# Patient Record
Sex: Female | Born: 1950 | Race: Black or African American | Hispanic: No | Marital: Single | Smoking: Former smoker
Health system: Southern US, Community
[De-identification: ages and names within clinical notes are randomized; demographics above are authoritative.]

## PROBLEM LIST (undated history)

## (undated) DIAGNOSIS — C801 Malignant (primary) neoplasm, unspecified: Secondary | ICD-10-CM

## (undated) DIAGNOSIS — M545 Low back pain, unspecified: Secondary | ICD-10-CM

## (undated) DIAGNOSIS — E669 Obesity, unspecified: Secondary | ICD-10-CM

## (undated) DIAGNOSIS — G4730 Sleep apnea, unspecified: Secondary | ICD-10-CM

## (undated) DIAGNOSIS — M1990 Unspecified osteoarthritis, unspecified site: Secondary | ICD-10-CM

## (undated) DIAGNOSIS — I10 Essential (primary) hypertension: Secondary | ICD-10-CM

## (undated) DIAGNOSIS — J439 Emphysema, unspecified: Secondary | ICD-10-CM

## (undated) DIAGNOSIS — I2699 Other pulmonary embolism without acute cor pulmonale: Secondary | ICD-10-CM

## (undated) DIAGNOSIS — C50919 Malignant neoplasm of unspecified site of unspecified female breast: Secondary | ICD-10-CM

## (undated) HISTORY — DX: Sleep apnea, unspecified: G47.30

## (undated) HISTORY — DX: Unspecified osteoarthritis, unspecified site: M19.90

## (undated) HISTORY — DX: Low back pain, unspecified: M54.50

## (undated) HISTORY — PX: BIOPSY BREAST: PRO8

## (undated) HISTORY — DX: Malignant (primary) neoplasm, unspecified: C80.1

## (undated) HISTORY — DX: Essential (primary) hypertension: I10

## (undated) HISTORY — DX: Low back pain: M54.5

## (undated) HISTORY — PX: TUBAL LIGATION: SHX77

## (undated) HISTORY — DX: Obesity, unspecified: E66.9

## (undated) HISTORY — DX: Malignant neoplasm of unspecified site of unspecified female breast: C50.919

---

## 2002-09-03 ENCOUNTER — Emergency Department (HOSPITAL_COMMUNITY): Admission: EM | Admit: 2002-09-03 | Discharge: 2002-09-03 | Payer: Self-pay | Admitting: Emergency Medicine

## 2004-05-01 ENCOUNTER — Emergency Department (HOSPITAL_COMMUNITY): Admission: EM | Admit: 2004-05-01 | Discharge: 2004-05-02 | Payer: Self-pay | Admitting: Emergency Medicine

## 2007-03-24 ENCOUNTER — Ambulatory Visit (HOSPITAL_COMMUNITY): Admission: RE | Admit: 2007-03-24 | Discharge: 2007-03-24 | Payer: Self-pay | Admitting: Internal Medicine

## 2007-04-08 ENCOUNTER — Other Ambulatory Visit: Admission: RE | Admit: 2007-04-08 | Discharge: 2007-04-08 | Payer: Self-pay | Admitting: Obstetrics & Gynecology

## 2007-04-25 ENCOUNTER — Emergency Department (HOSPITAL_COMMUNITY): Admission: EM | Admit: 2007-04-25 | Discharge: 2007-04-25 | Payer: Self-pay | Admitting: Emergency Medicine

## 2008-04-18 ENCOUNTER — Ambulatory Visit (HOSPITAL_COMMUNITY): Admission: RE | Admit: 2008-04-18 | Discharge: 2008-04-18 | Payer: Self-pay | Admitting: Internal Medicine

## 2009-04-23 ENCOUNTER — Ambulatory Visit (HOSPITAL_COMMUNITY): Admission: RE | Admit: 2009-04-23 | Discharge: 2009-04-23 | Payer: Self-pay | Admitting: Internal Medicine

## 2010-03-22 ENCOUNTER — Other Ambulatory Visit: Payer: Self-pay | Admitting: Occupational Therapy

## 2010-03-22 ENCOUNTER — Other Ambulatory Visit (HOSPITAL_COMMUNITY): Payer: Self-pay | Admitting: Nurse Practitioner

## 2010-03-22 DIAGNOSIS — Z139 Encounter for screening, unspecified: Secondary | ICD-10-CM

## 2010-04-29 ENCOUNTER — Ambulatory Visit (HOSPITAL_COMMUNITY)
Admission: RE | Admit: 2010-04-29 | Discharge: 2010-04-29 | Disposition: A | Discharge status: Home / Self Care | Payer: BC Managed Care – PPO | Source: Ambulatory Visit | Attending: Nurse Practitioner | Admitting: Nurse Practitioner

## 2010-04-29 DIAGNOSIS — Z1231 Encounter for screening mammogram for malignant neoplasm of breast: Secondary | ICD-10-CM | POA: Insufficient documentation

## 2010-04-29 DIAGNOSIS — Z139 Encounter for screening, unspecified: Secondary | ICD-10-CM

## 2010-04-30 ENCOUNTER — Other Ambulatory Visit: Payer: Self-pay | Admitting: Nurse Practitioner

## 2010-04-30 DIAGNOSIS — R928 Other abnormal and inconclusive findings on diagnostic imaging of breast: Secondary | ICD-10-CM

## 2010-06-19 ENCOUNTER — Other Ambulatory Visit (HOSPITAL_COMMUNITY): Payer: Self-pay | Admitting: Nurse Practitioner

## 2010-06-19 ENCOUNTER — Other Ambulatory Visit (HOSPITAL_COMMUNITY): Payer: Self-pay | Admitting: Family Medicine

## 2010-06-19 ENCOUNTER — Ambulatory Visit (HOSPITAL_COMMUNITY)
Admission: RE | Admit: 2010-06-19 | Discharge: 2010-06-19 | Disposition: A | Discharge status: Home / Self Care | Payer: BC Managed Care – PPO | Source: Ambulatory Visit | Attending: Nurse Practitioner | Admitting: Nurse Practitioner

## 2010-06-19 DIAGNOSIS — N63 Unspecified lump in unspecified breast: Secondary | ICD-10-CM | POA: Insufficient documentation

## 2010-06-19 DIAGNOSIS — R928 Other abnormal and inconclusive findings on diagnostic imaging of breast: Secondary | ICD-10-CM

## 2010-07-03 ENCOUNTER — Other Ambulatory Visit (HOSPITAL_COMMUNITY): Payer: Self-pay | Admitting: Family Medicine

## 2010-07-03 ENCOUNTER — Ambulatory Visit (HOSPITAL_COMMUNITY)
Admission: RE | Admit: 2010-07-03 | Discharge: 2010-07-03 | Disposition: A | Discharge status: Home / Self Care | Payer: BC Managed Care – PPO | Source: Ambulatory Visit | Attending: Family Medicine | Admitting: Family Medicine

## 2010-07-03 ENCOUNTER — Other Ambulatory Visit: Payer: Self-pay | Admitting: Radiology

## 2010-07-03 ENCOUNTER — Ambulatory Visit (HOSPITAL_COMMUNITY)
Admission: RE | Admit: 2010-07-03 | Discharge: 2010-07-03 | Disposition: A | Discharge status: Home / Self Care | Payer: BC Managed Care – PPO | Source: Ambulatory Visit | Attending: Nurse Practitioner | Admitting: Nurse Practitioner

## 2010-07-03 DIAGNOSIS — C50919 Malignant neoplasm of unspecified site of unspecified female breast: Secondary | ICD-10-CM | POA: Insufficient documentation

## 2010-07-03 DIAGNOSIS — N6310 Unspecified lump in the right breast, unspecified quadrant: Secondary | ICD-10-CM

## 2010-07-03 DIAGNOSIS — N63 Unspecified lump in unspecified breast: Secondary | ICD-10-CM | POA: Insufficient documentation

## 2010-07-08 ENCOUNTER — Other Ambulatory Visit: Payer: Self-pay | Admitting: Family Medicine

## 2010-07-08 DIAGNOSIS — C50911 Malignant neoplasm of unspecified site of right female breast: Secondary | ICD-10-CM

## 2010-07-15 ENCOUNTER — Ambulatory Visit
Admission: RE | Admit: 2010-07-15 | Discharge: 2010-07-15 | Disposition: A | Discharge status: Home / Self Care | Payer: BC Managed Care – PPO | Source: Ambulatory Visit | Attending: Family Medicine | Admitting: Family Medicine

## 2010-07-15 DIAGNOSIS — C50911 Malignant neoplasm of unspecified site of right female breast: Secondary | ICD-10-CM

## 2010-07-15 MED ORDER — GADOBENATE DIMEGLUMINE 529 MG/ML IV SOLN
20.0000 mL | Freq: Once | INTRAVENOUS | Status: AC | PRN
  Administered 2010-07-15: 20 mL via INTRAVENOUS

## 2010-07-18 ENCOUNTER — Other Ambulatory Visit (INDEPENDENT_AMBULATORY_CARE_PROVIDER_SITE_OTHER): Payer: Self-pay | Admitting: General Surgery

## 2010-07-18 ENCOUNTER — Ambulatory Visit (INDEPENDENT_AMBULATORY_CARE_PROVIDER_SITE_OTHER): Payer: BC Managed Care – PPO | Admitting: General Surgery

## 2010-07-18 ENCOUNTER — Encounter (INDEPENDENT_AMBULATORY_CARE_PROVIDER_SITE_OTHER): Payer: Self-pay | Admitting: General Surgery

## 2010-07-18 VITALS — BP 140/82 | HR 88 | Temp 97.2°F | Ht 62.0 in | Wt 242.4 lb

## 2010-07-18 DIAGNOSIS — C50911 Malignant neoplasm of unspecified site of right female breast: Secondary | ICD-10-CM

## 2010-07-18 DIAGNOSIS — C50319 Malignant neoplasm of lower-inner quadrant of unspecified female breast: Secondary | ICD-10-CM

## 2010-07-18 NOTE — Progress Notes (Signed)
Subjective:     Patient ID: Alexa Castillo, female   DOB: 29-Aug-1950, 60 y.o.   MRN: [id number]    BP 140/82  Pulse 88  Temp(Src) 97.2 F (36.2 C) (Temporal)  Ht 5\' 2"  (1.575 m)  Wt 242 lb 6.4 oz (109.952 kg)  BMI 44.34 kg/m2    HPI This patient is a pleasant 60 year old female referred through the breast center for a new diagnosis of cancer of the right breast. She recently presented for her routine screening mammogram. She had not noted any breast symptoms specifically no lump pain or discharge or skin changes. Screening mammogram revealed a probable abnormality in the lower inner right breast. Diagnostic mammogram and Spot compression views revealed a small oval nodule in this area. Subsequent ultrasound was performed showing a 6 mm hypoechoic nodule. Large core needle biopsy was recommended and was performed. I reviewed his pathology which has shown a grade 2 invasive ductal carcinoma. Prognostic panel is pending. MRI has been performed confirming a sub-1 cm nodule in this area with no other abnormalities. Past Medical History  Diagnosis Date  . Cancer     breast  . Diabetes mellitus   . Hypertension   . Obesity    Past Surgical History  Procedure Date  . Tubal ligation    Current Outpatient Prescriptions  Medication Sig Dispense Refill  . glipiZIDE (GLUCOTROL) 10 MG tablet       . lisinopril (PRINIVIL,ZESTRIL) 20 MG tablet       . metFORMIN (GLUCOPHAGE) 500 MG tablet       . pravastatin (PRAVACHOL) 20 MG tablet        Review of Systems  Constitutional: Negative for fever, chills and fatigue.  HENT: Negative for hearing loss and ear pain.   Eyes: Negative for visual disturbance.  Respiratory: Negative for cough, shortness of breath and wheezing.   Cardiovascular: Negative for chest pain, palpitations and leg swelling.  Gastrointestinal: Negative for vomiting, abdominal pain and constipation.       Objective:   Physical Exam Gen.: Obese African American female in no  acute distress  HEENT: No masses or thyromegaly. Sclera nonicteric.  Lymph nodes: No cervical supraclavicular or axillary nodes palpable.  Breasts: There is no deformity or skin change. I cannot feel any masses in either breast with particular attention to the lower inner quadrant of the right breast.  Cardiovascular: Regular rate and rhythm without murmur. Trace ankle edema. Peripheral pulses intact. No JVD.  Abdomen: Obese soft and nontender. No masses or organomegaly noted.  Extremities: Trace ankle edema. No joint swelling or deformity.  Neurologic: Alert and fully oriented. Gait normal.    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 Clinically T1 N0. We discussed initial surgical treatment options i.e. lumpectomy versus mastectomy. I believe she would be a very good candidate for lumpectomy and this is what she would prefer. We discussed in detail the nature of lumpectomy and axillary sentinel lymph node biopsy. She will need needle localization. We discussed risks of bleeding infection anesthetic complications and possible need for further surgery based on final pathology findings. Schedule this in the near future at her convenience.    Plan:    Right breast needle localized lumpectomy and right axillary sentinel lymph node biopsy as discussed above. Of note is the patient would like to have any followup treatment done in retail if possible.

## 2010-07-18 NOTE — Patient Instructions (Signed)
Will schedule surgery

## 2010-07-23 ENCOUNTER — Encounter (HOSPITAL_BASED_OUTPATIENT_CLINIC_OR_DEPARTMENT_OTHER)
Admission: RE | Admit: 2010-07-23 | Discharge: 2010-07-23 | Disposition: A | Discharge status: Home / Self Care | Payer: BC Managed Care – PPO | Source: Ambulatory Visit | Attending: General Surgery | Admitting: General Surgery

## 2010-07-23 LAB — POCT I-STAT, CHEM 8
Chloride: 103 mEq/L (ref 96–112)
Creatinine, Ser: 0.7 mg/dL (ref 0.50–1.10)
Glucose, Bld: 113 mg/dL — ABNORMAL HIGH (ref 70–99)
HCT: 42 % (ref 36.0–46.0)
TCO2: 26 mmol/L (ref 0–100)

## 2010-07-24 ENCOUNTER — Ambulatory Visit (HOSPITAL_BASED_OUTPATIENT_CLINIC_OR_DEPARTMENT_OTHER)
Admission: RE | Admit: 2010-07-24 | Discharge: 2010-07-24 | Disposition: A | Discharge status: Home / Self Care | Payer: BC Managed Care – PPO | Source: Ambulatory Visit | Attending: General Surgery | Admitting: General Surgery

## 2010-07-24 ENCOUNTER — Ambulatory Visit
Admission: RE | Admit: 2010-07-24 | Discharge: 2010-07-24 | Disposition: A | Discharge status: Home / Self Care | Payer: BC Managed Care – PPO | Source: Ambulatory Visit | Attending: General Surgery | Admitting: General Surgery

## 2010-07-24 ENCOUNTER — Other Ambulatory Visit (INDEPENDENT_AMBULATORY_CARE_PROVIDER_SITE_OTHER): Payer: Self-pay | Admitting: General Surgery

## 2010-07-24 ENCOUNTER — Ambulatory Visit (HOSPITAL_COMMUNITY)
Admission: RE | Admit: 2010-07-24 | Discharge: 2010-07-24 | Disposition: A | Discharge status: Home / Self Care | Payer: BC Managed Care – PPO | Source: Ambulatory Visit | Attending: General Surgery | Admitting: General Surgery

## 2010-07-24 DIAGNOSIS — C50911 Malignant neoplasm of unspecified site of right female breast: Secondary | ICD-10-CM

## 2010-07-24 DIAGNOSIS — Z01810 Encounter for preprocedural cardiovascular examination: Secondary | ICD-10-CM | POA: Insufficient documentation

## 2010-07-24 DIAGNOSIS — E669 Obesity, unspecified: Secondary | ICD-10-CM | POA: Insufficient documentation

## 2010-07-24 DIAGNOSIS — I10 Essential (primary) hypertension: Secondary | ICD-10-CM | POA: Insufficient documentation

## 2010-07-24 DIAGNOSIS — E119 Type 2 diabetes mellitus without complications: Secondary | ICD-10-CM | POA: Insufficient documentation

## 2010-07-24 DIAGNOSIS — C50919 Malignant neoplasm of unspecified site of unspecified female breast: Secondary | ICD-10-CM

## 2010-07-24 DIAGNOSIS — Z01812 Encounter for preprocedural laboratory examination: Secondary | ICD-10-CM | POA: Insufficient documentation

## 2010-07-24 DIAGNOSIS — C50319 Malignant neoplasm of lower-inner quadrant of unspecified female breast: Secondary | ICD-10-CM | POA: Insufficient documentation

## 2010-07-24 LAB — GLUCOSE, CAPILLARY: Glucose-Capillary: 169 mg/dL — ABNORMAL HIGH (ref 70–99)

## 2010-07-24 MED ORDER — TECHNETIUM TC 99M SULFUR COLLOID FILTERED
1.0000 | Freq: Once | INTRAVENOUS | Status: AC | PRN
  Administered 2010-07-24: 1 via INTRADERMAL

## 2010-07-25 LAB — GLUCOSE, CAPILLARY: Glucose-Capillary: 137 mg/dL — ABNORMAL HIGH (ref 70–99)

## 2010-07-26 ENCOUNTER — Telehealth (INDEPENDENT_AMBULATORY_CARE_PROVIDER_SITE_OTHER): Payer: Self-pay | Admitting: General Surgery

## 2010-07-26 NOTE — Telephone Encounter (Signed)
LM path OK

## 2010-07-29 NOTE — Op Note (Addendum)
NAMEAUBREY, Alexa Castillo              ACCOUNT NO.:  [id number]  MEDICAL RECORD NO.:  [id number]  LOCATION:  [facility]C                          FACILITY:  [facility]  PHYSICIAN:  Sharlet Salina T. Kimari Coudriet, M.D.DATE OF BIRTH:  [birth year]  DATE OF PROCEDURE:  07/24/2010 DATE OF DISCHARGE:                              OPERATIVE REPORT   PREOPERATIVE DIAGNOSIS:  Cancer of the right breast.  POSTOPERATIVE DIAGNOSIS:  Cancer of the right breast.  SURGICAL PROCEDURES: 1. Blue dye injection, right breast. 2. Needle-localized right breast lumpectomy. 3. Right axillary sentinel lymph node biopsy.  SURGEON:  Lorne Skeens. Nicklous Aburto, MD  ANESTHESIA:  General.  BRIEF HISTORY:  Ms. Gordy is a 61 year old female who on recent mammogram was found to have a small sub 1-cm mass in the lower inner right breast.  Clinical stage T1 N0 M0.  Following discussion of treatment options, we have elected to breast conservation with the needle-localized lumpectomy and sentinel lymph node biopsy.  The patient underwent needle localization at the Breast Center preoperatively.  She had injection of 1 mCi of technetium sulfur colloid in the holding area. She is now brought to the operating room for these procedures.  DESCRIPTION OF OPERATION:  The patient was brought to the operating room, placed supine position on the operating table.  A laryngeal mask general anesthesia was induced.  The right breast was sterilely prepped and correct patient and procedure verified.  With sterile technique, 10 mL of dilute methylene blue was injected subcutaneously beneath the right nipple and massaged for several minutes.  The entire breast axilla, upper arm were then widely and sterilely prepped and draped. The lumpectomy was approached initially.  A transverse incision was made medially over the described location of the mass and dissection carried down through the subcutaneous tissue at the breast capsule.  The wire was brought into the  incision.  I then excised a generous core of tissue around the shaft and tip of the wire.  The specimen was oriented with ink and specimen mammogram obtained.  This showed the intact wire clip and lesion within the specimen.  The closest margin appeared to be medial and slightly inferior and I reexcised this area of the lumpectomy cavity back another 1/2 cm so and the specimen was oriented and sent separately for permanent pathology.  A complete hemostasis was obtained with cautery.  The soft tissue was infiltrated with Marcaine. Initially, the breast tissue and subcutaneous tissue were closed with interrupted 3-0 Vicryl.  Attention was then turned to the sentinel lymph node.  A hot area in the right axilla was identified with the Neoprobe and a small transverse incision made.  Dissection was carried down through subcutaneous tissue using cautery and the clavipectoral fascia was carefully entered with blunt dissection.  Using the Neoprobe for guidance, I bluntly dissected down onto a blue, very hot otherwise normal-appearing lymph node.  It was dissected away from the axillary contents with cautery and removed intact.  Ex vivo the node had counts of about 400 and background after removal in the axilla was essentially 0.  This was sent for permanent pathology as a hot blue axillary sentinel lymph node.  Hemostasis was obtained in this  wound with cautery.  The soft tissue here was infiltrated with Marcaine with epinephrine.  The deep subcu was closed with interrupted Vicryl.  Both incisions were closed with subcuticular Monocryl and Dermabond.  Sponge, needle, and instrument counts were correct.  The patient was taken to the recovery room in good condition.     Lorne Skeens. Onna Nodal, M.D.     Tory Emerald  D:  07/24/2010  T:  07/24/2010  Job:  [id number]  Electronically Signed by Glenna Fellows M.D. on 08/19/2010 04:40:28 PM

## 2010-07-31 ENCOUNTER — Telehealth (INDEPENDENT_AMBULATORY_CARE_PROVIDER_SITE_OTHER): Payer: Self-pay | Admitting: General Surgery

## 2010-08-12 ENCOUNTER — Other Ambulatory Visit (INDEPENDENT_AMBULATORY_CARE_PROVIDER_SITE_OTHER): Payer: Self-pay | Admitting: General Surgery

## 2010-08-12 ENCOUNTER — Other Ambulatory Visit (INDEPENDENT_AMBULATORY_CARE_PROVIDER_SITE_OTHER): Payer: Self-pay

## 2010-08-12 DIAGNOSIS — C50919 Malignant neoplasm of unspecified site of unspecified female breast: Secondary | ICD-10-CM

## 2010-08-23 ENCOUNTER — Encounter (INDEPENDENT_AMBULATORY_CARE_PROVIDER_SITE_OTHER): Payer: Self-pay | Admitting: General Surgery

## 2010-08-23 ENCOUNTER — Ambulatory Visit (INDEPENDENT_AMBULATORY_CARE_PROVIDER_SITE_OTHER): Payer: BC Managed Care – PPO | Admitting: General Surgery

## 2010-08-23 VITALS — Temp 98.0°F | Wt 245.8 lb

## 2010-08-23 DIAGNOSIS — Z9889 Other specified postprocedural states: Secondary | ICD-10-CM

## 2010-08-23 NOTE — Patient Instructions (Signed)
Call as needed for questions or concerns 

## 2010-08-23 NOTE — Progress Notes (Signed)
Patient returns for followup in 3 weeks following right breast lumpectomy and sentinel lymph node biopsy. She reports she is doing well with no problems.  Exam: Both wounds are well healed without complication.  We reviewed her pathology which showed a negative sentinel lymph node. Invasive ductal carcinoma was present grade 2 spanning 0.7 cm. 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  Assessment and plan: She is doing well postoperatively. She has appointments at the cancer center. I will see her back in 6 months for long-term cancer followup.

## 2010-09-06 ENCOUNTER — Encounter (HOSPITAL_COMMUNITY): Payer: Self-pay | Admitting: Oncology

## 2010-09-06 ENCOUNTER — Encounter (HOSPITAL_COMMUNITY): Payer: BC Managed Care – PPO | Attending: Oncology | Admitting: Oncology

## 2010-09-06 VITALS — BP 144/88 | HR 106 | Temp 98.9°F | Wt 245.0 lb

## 2010-09-06 DIAGNOSIS — Z170 Estrogen receptor positive status [ER+]: Secondary | ICD-10-CM

## 2010-09-06 DIAGNOSIS — C50319 Malignant neoplasm of lower-inner quadrant of unspecified female breast: Secondary | ICD-10-CM

## 2010-09-06 NOTE — Patient Instructions (Signed)
[facility]  Discharge Instructions  RECOMMENDATIONS MADE BY THE CONSULTANT AND ANY TEST RESULTS WILL BE SENT TO YOUR REFERRING DOCTOR.   EXAM FINDINGS BY MD TODAY AND SIGNS AND SYMPTOMS TO REPORT TO CLINIC OR PRIMARY MD: Will need to see a Radiation Oncologist as soon as possible.  Hormonal therapy would not add a great deal of benefit and due to potential side effects we would not use.  MEDICATIONS PRESCRIBED: none   INSTRUCTIONS GIVEN AND DISCUSSED:  Report any new lumps, bone pain or shortness of breath.   SPECIAL INSTRUCTIONS/FOLLOW-UP: Lab work Needed in 3 months and Other:  Follow -up with MD in 3 months.   I acknowledge that I have been informed and understand all the instructions given to me and received a copy. I do not have any more questions at this time, but understand that I may call the Specialty Clinic at [facility] at [phone] during business hours should I have any further questions or need assistance in obtaining follow-up care.    __________________________________________  _____________  __________ Signature of Patient or Authorized Representative            Date                   Time    __________________________________________ Nurse's Signature

## 2010-09-06 NOTE — Progress Notes (Signed)
This office note has been dictated.

## 2010-10-25 NOTE — Progress Notes (Signed)
CC:   Lorne Skeens. Hoxworth, M.D. [facility]  DIAGNOSIS: 1. Stage IB, grade 2 invasive ductal carcinoma status post lumpectomy     and sentinel node biopsy by Dr. Jaclynn Guarneri on 07/24/2010.  Lymph     node was negative.  The tumor was 7 mm in size, it was grade 2.  It     was extremely ER positive at 100%, PR positive 100%, Ki-67,     however, was 50%, HER2 was not amplified. 2. Obesity. 3. History of hypertension. 4. History of diabetes mellitus. 5. History of poor dental hygiene. 6. History of hypercholesterolemia on Pravachol.  HISTORY:  This is a pleasant lady, 60 years old, who on routine mammography was found have a small but definite lesion confirmed by MRI to be around 8 mm across.  At the time of pathology, it was 7 mm across. The predefinitive surgical biopsy was positive for tumor.  It was felt to be grade 2 and was confirmed on the final histology.  She was not able to breast feed her children.  She is postmenopausal.  She has had a previous breast biopsy on the right before, years ago in [place], that was benign.  She is not using postmenopausal estrogens.  She works at [employer] in [place] and has done so for 15 years.  Prior to that, she worked at [employer] about 8 years.  She raised her 2 sons who are in good health.  She presently lives with her younger son and helps raise her 2 granddaughters.  His significant other died at age 45 of unclear causes.  She has been helping raise them since they were 4 and 5, respectively.  FAMILY HISTORY:  Her sister had breast cancer, and her mother died of some type of liver cancer.  Her postoperative course has been uneventful, and she is recovering well.  She has no history of blood clots, no history of stroke, heart attack, etc.  Presently, she is not having any headaches, nausea, vomiting, etc.  The rest of the oncologic review of systems is negative.  PHYSICAL EXAMINATION:  Vital Signs:  She  has a weight of 245 pounds. Her height is not recorded.  She has a blood pressure of 144/88.  She is afebrile, pulse right around 100 and regular, respirations 16-18 and unlabored.  Skin:  Warm and dry to the touch.  General:  She is in no acute distress.  HEENT:  She does have poor dental hygiene.  Pupils show early cataract changes bilaterally, but pupils respond to light equally well.  EOMs are intact.  Facial symmetry is intact.  Neck:  She has no thyromegaly.  Nodes:  No adenopathy in any location.  Lungs:  Clear to auscultation and percussion.  She has no CVA or bony tenderness. Breasts:  The left breast is negative.  The right breast has incisions which are extremely well healed.  They do not appear to be tender. There are no masses.  Heart:  Shows a regular rhythm and rate, at this time rate right around 80 supine.  She has no S3 gallop or murmur. Abdomen:  Obese, nontender without obvious masses or organomegaly. Extremities:  She has no peripheral edema of the arms or legs.  She does complain of intermittent edema when I mentioned her legs to her, intermittently only on the right.  It is usually when she is on her feet.  She is right-handed.  She has a stage IB tumor which will  most likely not take her life over the next 10 years.  That is the good news.  The bad news is that the Ki- 67 marker is slightly high, and so I think she is a candidate for possibly consideration of adjuvant hormone therapy, if nothing else but to help protect the left breast.  In the meantime, she has not seen a radiation therapist, and we will set that up as soon as possible.  Since she kept her breast, she will need radiation.  She should do well no matter what we do, but we will try to find a hormone agent that agrees with her and does not give her too many side effects.  It may reduce her risk from somewhere around 2% to 8% to about 1% to 6% over the next 5-10 years, and that is about all we can,  I think, hope for.  I do not think it will help mortality significantly at this time; that is in reducing mortality.  We will see her back after radiation.  We will go over the drugs at that time.  We will try to probably take an aromatase inhibitor such as Arimidex and then go forward.  I did not need any blood work on her today.  We will just check that in 3 months.    ______________________________ Ladona Horns. Mariel Sleet, MD ESN/MEDQ  D:  09/06/2010  T:  09/06/2010  Job:  [id number]

## 2010-12-04 ENCOUNTER — Encounter (HOSPITAL_COMMUNITY): Payer: BC Managed Care – PPO | Attending: Oncology

## 2010-12-04 DIAGNOSIS — C50919 Malignant neoplasm of unspecified site of unspecified female breast: Secondary | ICD-10-CM | POA: Insufficient documentation

## 2010-12-04 DIAGNOSIS — C50319 Malignant neoplasm of lower-inner quadrant of unspecified female breast: Secondary | ICD-10-CM

## 2010-12-04 DIAGNOSIS — I10 Essential (primary) hypertension: Secondary | ICD-10-CM | POA: Insufficient documentation

## 2010-12-04 DIAGNOSIS — E119 Type 2 diabetes mellitus without complications: Secondary | ICD-10-CM | POA: Insufficient documentation

## 2010-12-04 LAB — COMPREHENSIVE METABOLIC PANEL
ALT: 22 U/L (ref 0–35)
AST: 17 U/L (ref 0–37)
Alkaline Phosphatase: 93 U/L (ref 39–117)
CO2: 28 mEq/L (ref 19–32)
Calcium: 10 mg/dL (ref 8.4–10.5)
GFR calc Af Amer: 89 mL/min — ABNORMAL LOW (ref >90)
GFR calc non Af Amer: 77 mL/min — ABNORMAL LOW (ref >90)
Glucose, Bld: 174 mg/dL — ABNORMAL HIGH (ref 70–99)
Potassium: 4.1 mEq/L (ref 3.5–5.1)
Sodium: 140 mEq/L (ref 135–145)
Total Protein: 7.6 g/dL (ref 6.0–8.3)

## 2010-12-04 LAB — CBC
Hemoglobin: 13.2 g/dL (ref 12.0–15.0)
Platelets: 264 10*3/uL (ref 150–400)
RBC: 4.48 MIL/uL (ref 3.87–5.11)

## 2010-12-04 NOTE — Progress Notes (Signed)
Labs drawn today for cbc,cmp 

## 2010-12-10 ENCOUNTER — Encounter (HOSPITAL_COMMUNITY): Payer: Self-pay | Admitting: Oncology

## 2010-12-10 ENCOUNTER — Encounter (HOSPITAL_BASED_OUTPATIENT_CLINIC_OR_DEPARTMENT_OTHER): Payer: BC Managed Care – PPO | Admitting: Oncology

## 2010-12-10 VITALS — BP 129/83 | HR 108 | Temp 98.5°F | Ht 61.75 in | Wt 245.4 lb

## 2010-12-10 DIAGNOSIS — C50319 Malignant neoplasm of lower-inner quadrant of unspecified female breast: Secondary | ICD-10-CM

## 2010-12-10 DIAGNOSIS — Z170 Estrogen receptor positive status [ER+]: Secondary | ICD-10-CM

## 2010-12-10 MED ORDER — ANASTROZOLE 1 MG PO TABS: 1.0000 mg | ORAL_TABLET | Freq: Every day | ORAL | Status: AC

## 2010-12-10 NOTE — Progress Notes (Signed)
This office note has been dictated.

## 2010-12-10 NOTE — Patient Instructions (Signed)
[facility]  Discharge Instructions Alexa Castillo  [id number] [birth year]  RECOMMENDATIONS MADE BY THE CONSULTANT AND ANY TEST RESULTS WILL BE SENT TO YOUR REFERRING DOCTOR.   EXAM FINDINGS BY MD TODAY AND SIGNS AND SYMPTOMS TO REPORT TO CLINIC OR PRIMARY MD: We need to start antihormone pill to cut out estrogen supply to cancer.  MEDICATIONS PRESCRIBED: Arimidex  --take one daily. Start today or tomorrow. This was sent to [facility]. Follow label directions  INSTRUCTIONS GIVEN AND DISCUSSED: Other : Side-effects of med include aching, joint pain. If you experience changes in the aching you have now, let us know and we can try another pill.  Also you may have hot flashes. Notify us of any uterine bleeding and continue your pap smears. Notify us of swelling, pain in back of lower leg or shortness of breath and chest pain. There is a 1% chance of blood clots.   SPECIAL INSTRUCTIONS/FOLLOW-UP: Return to Clinic in 2 months   I acknowledge that I have been informed and understand all the instructions given to me and received a copy. I do not have any more questions at this time, but understand that I may call the Specialty Clinic at [facility] at [phone] during business hours should I have any further questions or need assistance in obtaining follow-up care.    __________________________________________  _____________  __________ Signature of Patient or Authorized Representative            Date                   Time    __________________________________________ Nurse's Signature

## 2010-12-10 NOTE — Progress Notes (Signed)
CC:   [facility] Dr. Rene Paci, MD Lorne Skeens. Hoxworth, M.D.  DIAGNOSES: 1. Stage IB (T1b N0) invasive ductal carcinoma of the right breast.     Estrogen receptor positive.  Progesterone receptor positive.  Ki-67     was 50%.  HER-2 was negative. 2. Obesity. 3. Diabetes mellitus. 4. History of hypertension. 5. History of right knee pain. This lady had a small tumor, 7 mm in size.  It is ER-positive 100%, PR- positive at 100%, HER-2 negative.  Ki-67 marker, interestingly, was 50%, but with this size tumor it was impossible to justify the chemotherapy. She had a sentinel lymph node that was negative as well.  So she has completed radiation therapy after lumpectomy and sentinel node biopsy, and that just finished.  She finished that on 11/07/2010.  She is ready to consider antiestrogen therapy, and we will start with an aromatase inhibitor, namely Arimidex 1 mg a day, and I went over in detail with her the side effects.  We spent 25 minutes to 30 minutes discussing the details of the drug, the side effects potentially and the potential for benefit.  I will see her in 2 months to make sure she is tolerating the drug all right, and she knows to call sooner if need be.  I did not do any blood work on her today.  If she is fine in 2 months, we will see her 6 months later with blood work probably at that time.  She is ready to proceed.  She will start the therapy today and the drug was E-prescribed to her pharmacy.    ______________________________ Ladona Horns. Mariel Sleet, MD ESN/MEDQ  D:  12/10/2010  T:  12/10/2010  Job:  [id number]

## 2011-02-06 ENCOUNTER — Encounter (INDEPENDENT_AMBULATORY_CARE_PROVIDER_SITE_OTHER): Payer: Self-pay | Admitting: General Surgery

## 2011-02-07 ENCOUNTER — Encounter (HOSPITAL_COMMUNITY): Payer: BC Managed Care – PPO | Attending: Oncology | Admitting: Oncology

## 2011-02-07 VITALS — BP 144/86 | HR 105 | Temp 98.4°F | Wt 241.4 lb

## 2011-02-07 DIAGNOSIS — E669 Obesity, unspecified: Secondary | ICD-10-CM

## 2011-02-07 DIAGNOSIS — C50319 Malignant neoplasm of lower-inner quadrant of unspecified female breast: Secondary | ICD-10-CM

## 2011-02-07 DIAGNOSIS — M25559 Pain in unspecified hip: Secondary | ICD-10-CM

## 2011-02-07 NOTE — Patient Instructions (Signed)
[facility]  Discharge Instructions  RECOMMENDATIONS MADE BY THE CONSULTANT AND ANY TEST RESULTS WILL BE SENT TO YOUR REFERRING DOCTOR.   EXAM FINDINGS BY MD TODAY AND SIGNS AND SYMPTOMS TO REPORT TO CLINIC OR PRIMARY MD: Exam good today. Continue the Arimidex as ordered. Have the xray of your hip done as soon as possible. Return to clinic in 6 months to see the doctor. Report any problems or concerns to the clinic as needed.    I acknowledge that I have been informed and understand all the instructions given to me and received a copy. I do not have any more questions at this time, but understand that I may call the Specialty Clinic at [facility] at [phone] during business hours should I have any further questions or need assistance in obtaining follow-up care.    __________________________________________  _____________  __________ Signature of Patient or Authorized Representative            Date                   Time    __________________________________________ Nurse's Signature

## 2011-02-07 NOTE — Progress Notes (Signed)
This office note has been dictated.

## 2011-02-07 NOTE — Progress Notes (Signed)
CC:   Alexa Castillo. Hoxworth, M.D. [name], FNP [name], M.D.  DIAGNOSES: 1. Stage IB, grade 2 invasive ductal carcinoma of the right breast.     Status post lumpectomy and sentinel node biopsy by Dr. Johna Sheriff on     07/24/2010.  Lymph node was negative.  The tumor 7 mm in size,     grade 2, very ER positive 100%, PR positive 100%, Ki-67 marker     however was 50%.  HER-2/neu was negative for amplification.  We     elected to give her postoperative radiation therapy and Arimidex,     more for the opposite breast than for this cancer which has a good     prognosis. 2. Obesity. 3. Hypertension. 4. Diabetes mellitus. 5. Poor dental hygiene. 6. Hypercholesterolemia on Pravachol.  Alexa Castillo is here today, tolerating the Arimidex very well except for a few hot flashes which are getting better she states.  She however has had right hip pain for about 2 weeks, more when she walks.  It does not really get that much better after she gets up and moves around.  It does not bother her at night when she is sleeping or laying in bed.  It does not bother her that much when she is sitting down.  She is still working at [employer].  She has had pain in her hip she states intermittently for several years, but this is the worst it has been.  She is not aware of any else new or different on oncologic review of systems.  Her vital signs today still show that she is very overweight at 241 pounds, but she is trying to eliminate the junk food she states. She is down about 3-1/2 to 4 pounds.  Her blood pressure is 144/86 in the sitting position.  Her respirations are 18 and unlabored.  Her temperature is normal.  She is complaining of some discomfort without question in the right hip.  It is still a score of 6 she states.  She has no obvious adenopathy.  No obvious breast masses in either breast. The lungs are clear to auscultation and percussion.  Heart shows a regular rhythm and rate  without obvious murmur, rub, or gallop.  Abdomen is obese without obvious masses or organomegaly.  She has no leg edema. No arm edema.  She has to work next week and would like to have an x-ray of her hip only the week of February 11, so will set it up for that week.  If she has anything significant, we will get an orthopedic consultation.  She has not seen an orthopedist in her lifetime.    ______________________________ Ladona Horns. Mariel Sleet, MD ESN/MEDQ  D:  02/07/2011  T:  02/07/2011  Job:  [id number]

## 2011-02-18 ENCOUNTER — Ambulatory Visit (HOSPITAL_COMMUNITY)
Admission: RE | Admit: 2011-02-18 | Discharge: 2011-02-18 | Disposition: A | Discharge status: Home / Self Care | Payer: BC Managed Care – PPO | Source: Ambulatory Visit | Attending: Oncology | Admitting: Oncology

## 2011-02-18 DIAGNOSIS — M47817 Spondylosis without myelopathy or radiculopathy, lumbosacral region: Secondary | ICD-10-CM | POA: Insufficient documentation

## 2011-02-18 DIAGNOSIS — M25559 Pain in unspecified hip: Secondary | ICD-10-CM | POA: Insufficient documentation

## 2011-02-18 DIAGNOSIS — M1610 Unilateral primary osteoarthritis, unspecified hip: Secondary | ICD-10-CM | POA: Insufficient documentation

## 2011-02-18 DIAGNOSIS — C50319 Malignant neoplasm of lower-inner quadrant of unspecified female breast: Secondary | ICD-10-CM

## 2011-02-26 ENCOUNTER — Ambulatory Visit: Payer: BC Managed Care – PPO | Admitting: Orthopedic Surgery

## 2011-03-05 ENCOUNTER — Ambulatory Visit (INDEPENDENT_AMBULATORY_CARE_PROVIDER_SITE_OTHER): Payer: BC Managed Care – PPO | Admitting: Orthopedic Surgery

## 2011-03-05 ENCOUNTER — Encounter: Payer: Self-pay | Admitting: Orthopedic Surgery

## 2011-03-05 DIAGNOSIS — M5136 Other intervertebral disc degeneration, lumbar region: Secondary | ICD-10-CM

## 2011-03-05 DIAGNOSIS — M169 Osteoarthritis of hip, unspecified: Secondary | ICD-10-CM

## 2011-03-05 DIAGNOSIS — M5137 Other intervertebral disc degeneration, lumbosacral region: Secondary | ICD-10-CM

## 2011-03-05 MED ORDER — DICLOFENAC POTASSIUM 50 MG PO TABS: 50.0000 mg | ORAL_TABLET | Freq: Two times a day (BID) | ORAL | Status: AC

## 2011-03-05 NOTE — Patient Instructions (Addendum)
Call PT department closest to you to schedule therapy   Degenerative Disc Disease Degenerative disc disease is a condition caused by the changes that occur in the cushions of the backbone (spinal discs) as you grow older. Spinal discs are soft and compressible discs located between the bones of the spine (vertebrae). They act like shock absorbers. Degenerative disc disease can affect the wholespine. However, the neck and lower back are most commonly affected. Many changes can occur in the spinal discs with aging, such as:  The spinal discs may dry and shrink.     Small tears may occur in the tough, outer covering of the disc (annulus).     The disc space may become smaller due to loss of water.     Abnormal growths in the bone (spurs) may occur. This can put pressure on the nerve roots exiting the spinal canal, causing pain.     The spinal canal may become narrowed.  CAUSES   Degenerative disc disease is a condition caused by the changes that occur in the spinal discs with aging. The exact cause is not known, but there is a genetic basis for many patients. Degenerative changes can occur due to loss of fluid in the disc. This makes the disc thinner and reduces the space between the backbones. Small cracks can develop in the outer layer of the disc. This can lead to the breakdown of the disc. You are more likely to get degenerative disc disease if you are overweight. Smoking cigarettes and doing heavy work such as weightlifting can also increase your risk of this condition. Degenerative changes can start after a sudden injury. Growth of bone spurs can compress the nerve roots and cause pain.   SYMPTOMS   The symptoms vary from person to person. Some people may have no pain, while others have severe pain. The pain may be so severe that it can limit your activities. The location of the pain depends on the part of your backbone that is affected. You will have neck or arm pain if a disc in the neck area  is affected. You will have pain in your back, buttocks, or legs if a disc in the lower back is affected. The pain becomes worse while bending, reaching up, or with twisting movements. The pain may start gradually and then get worse as time passes. It may also start after a major or minor injury. You may feel numbness or tingling in the arms or legs.   DIAGNOSIS   Your caregiver will ask you about your symptoms and about activities or habits that may cause the pain. He or she may also ask about any injuries, diseases, ortreatments you have had earlier. Your caregiver will examine you to check for the range of movement that is possible in the affected area, to check for strength in your extremities, and to check for sensation in the areas of the arms and legs supplied by different nerve roots. An X-ray of the spine may be taken. Your caregiver may suggest other imaging tests, such as a computerized magnetic scan (MRI), if needed.   TREATMENT   Treatment includes rest, modifying your activities, and applying ice and heat. Your caregiver may prescribe medicines to reduce your pain and may ask you to do some exercises to strengthen your back. In some cases, you may need surgery. You and your caregiver will decide on the treatment that is best for you. HOME CARE INSTRUCTIONS    Follow proper lifting and walking  techniques as advised by your caregiver.     Maintain good posture.     Exercise regularly as advised.     Perform relaxation exercises.     Change your sitting, standing, and sleeping habits as advised. Change positions frequently.     Lose weight as advised.     Stop smoking if you smoke.     Wear supportive footwear.  SEEK MEDICAL CARE IF:   The pain does not go away within 1 to 4 weeks. SEEK IMMEDIATE MEDICAL CARE IF:    The pain is severe.     You notice weakness in your arms, hands, or legs.     You begin to lose control of your bladder or bowel.  MAKE SURE YOU:    Understand  these instructions.     Will watch your condition.     Will get help right away if you are not doing well or get worse.  Document Released: 10/27/2006 Document Revised: 09/11/2010 Document Reviewed: 10/27/2006 [facility] Patient Information 2012 [place], [place].Total Hip Replacement   In total hip replacement, the damaged hip is replaced with an artificial hip joint (prosthesis). The purpose of this surgery is to reduce pain and improve your range of motion. It is one of the most successful joint replacement surgeries. LET YOUR CAREGIVER KNOW ABOUT:    Allergies.     Medicines taken, including herbs, eyedrops, over-the-counter medicines, and creams.     Use of steroids (by mouth or creams).     Previous problems with anesthetics.     Family history of anesthetic problems.     Possibility of pregnancy, if this applies.     History of blood clots (thrombophlebitis).     History of bleeding or blood problems.     Previous surgery.     Other health problems.  BEFORE THE PROCEDURE    Do not eat or drink anything for as long as directed by your caregiver before surgery.     You should be present 60 minutes before your procedure or as directed by your caregiver.  PROCEDURE An intravenous (IV) line for giving fluids will be started. You will be given medicines and gas to make you sleep (anesthetic), or you will be given medicines through a needle in your back to make you numb from the waist down. Your surgeon will take out any damaged cartilage and bone. He or she will then put in new metal, plastic, or ceramic joint surfaces to restore alignment and function to your hip. AFTER THE PROCEDURE   After the procedure, you will be taken to the recovery area where a nurse will watch and check your progress. You may have a long, narrow tube (catheter) in your bladder after surgery. The catheter helps you empty your bladder (pass your urine). Once you are awake, stable, and taking fluids well,  you will be returned to your room. You will receive physical therapy until you are doing well and your caregiver feels it is safe for you to go home. If you do not have help at home, you may need to go to an extended care facility for 5 to 14 days after the procedure. Document Released: 04/07/2000 Document Revised: 09/11/2010 Document Reviewed: 11/01/2008 [facility] Patient Information 2012 [place], [place].

## 2011-03-08 ENCOUNTER — Encounter: Payer: Self-pay | Admitting: Orthopedic Surgery

## 2011-03-08 DIAGNOSIS — M5136 Other intervertebral disc degeneration, lumbar region: Secondary | ICD-10-CM | POA: Insufficient documentation

## 2011-03-08 DIAGNOSIS — M169 Osteoarthritis of hip, unspecified: Secondary | ICD-10-CM | POA: Insufficient documentation

## 2011-03-08 NOTE — Progress Notes (Signed)
  Subjective:    Alexa Castillo is a 61 y.o. female who presents with pain in the RIGHT hip for over a year but worse in the last 3 months.  The pain onset was gradual.  The pain is sharp constant and moderate-to-severe rated 7/10.  The pain occurs in the morning and throughout the day occasionally relieved by Advil.  The pain is worse when sitting or standing for long periods of time.  There is some catching in the RIGHT hip but the patient reports she can do most of her activities of daily living without difficulty.  The following portions of the patient's history were reviewed and updated as appropriate: allergies, current medications, past family history, past medical history, past social history, past surgical history and problem list.   Review of Systems A comprehensive review of systems was negative except for: positive findings of respiratory system snoring, genitourinary system frequency.  Blurred vision.   Objective:    BP 110/60  Ht 5' 1.75" (1.568 m)  Wt 241 lb (109.317 kg)  BMI 44.44 kg/m2  Vital signs are stable as recorded  General appearance is normal  The patient is alert and oriented x3  The patient's mood and affect are normal  Gait assessment: normal The cardiovascular exam reveals normal pulses and temperature without edema swelling.  The lymphatic system is negative for palpable lymph nodes  The sensory exam is normal.  There are no pathologic reflexes.  Balance is normal.  Upper extremity exam  Inspection and palpation revealed no abnormalities in the upper extremities.  Range of motion is full without contracture.  Motor exam is normal with grade 5 strength.  The joints are fully reduced without subluxation.  There is no atrophy or tremor and muscle tone is normal.  All joints are stable.  Exam of the lumbar spine Inspection lumbar spine tenderness gluteal tenderness RIGHT side Range of motion decreased flexion-extension lumbar spine Stability  normal Strength normal Skin normal  Right hip: inspection reveals no tenderness around the hip area greater trochanter.  Her hip flexion is normal she has some mild pain with internal rotation of the hip I cannot reproduce the pain that she is having with motion of the hip.  Muscle tone is normal in the hip is stable.  Left hip: full painless range of motion, without tenderness and negatives: no pain with heel impact normal strength and stability   Imaging: X-ray pelvis was done at the hospital as well as hip x-rays and it does show that she has bilateral degenerative joint disease of the hip.  There is also some spondylosis noted in the lumbar area.: further x-rays will be needed.    Assessment:    problem #1 spondylosis lumbar spine   Problem #2 osteoarthritis bilateral hips   Plan:    I think at this time most of the pain is coming from her lumbar spine despite her hip arthritis so we are going to start some physical therapy at first.

## 2011-03-11 ENCOUNTER — Ambulatory Visit (HOSPITAL_COMMUNITY)
Admission: RE | Admit: 2011-03-11 | Discharge: 2011-03-11 | Disposition: A | Discharge status: Home / Self Care | Payer: BC Managed Care – PPO | Source: Ambulatory Visit | Attending: Orthopedic Surgery | Admitting: Orthopedic Surgery

## 2011-03-11 DIAGNOSIS — M545 Low back pain, unspecified: Secondary | ICD-10-CM | POA: Insufficient documentation

## 2011-03-11 DIAGNOSIS — R262 Difficulty in walking, not elsewhere classified: Secondary | ICD-10-CM | POA: Insufficient documentation

## 2011-03-11 DIAGNOSIS — E119 Type 2 diabetes mellitus without complications: Secondary | ICD-10-CM | POA: Insufficient documentation

## 2011-03-11 DIAGNOSIS — M25559 Pain in unspecified hip: Secondary | ICD-10-CM | POA: Insufficient documentation

## 2011-03-11 DIAGNOSIS — IMO0001 Reserved for inherently not codable concepts without codable children: Secondary | ICD-10-CM | POA: Insufficient documentation

## 2011-03-11 DIAGNOSIS — R29898 Other symptoms and signs involving the musculoskeletal system: Secondary | ICD-10-CM | POA: Insufficient documentation

## 2011-03-11 NOTE — Evaluation (Signed)
Physical Therapy Evaluation  Patient Details  Name: Alexa Castillo MRN: [id number] Date of Birth: 22-Jul-1950  Today's Date: 03/11/2011 Time: 4098-1191 Time Calculation (min): 50 min  Visit#: 1  of 12   Re-eval: 04/10/11 Assessment Diagnosis: DDD Next MD Visit: if needed Prior Therapy: none  Past Medical History:  Past Medical History  Diagnosis Date  . Cancer     breast  . Diabetes mellitus   . Hypertension   . Obesity   . Breast cancer   . Low back pain    Past Surgical History:  Past Surgical History  Procedure Date  . Tubal ligation   . Biopsy breast     Subjective Symptoms/Limitations Symptoms: Alexa Castillo states that her back pain has been constant for the past three years and is progressive in nature.  She states for the past three months she has been having pain into her right hip as well.   She was placed on a new medication which has improved her pain by 30%.  She is being referred to PT to improve her symptoms of pain and return her to prior functinal level. How long can you sit comfortably?: Sitting makes her back feels better but if she sits for longer than an hour it is difficult for her to get up. How long can you stand comfortably?: The patient has increased pain after standing for 10-15 minutes How long can you walk comfortably?: Walking increases her pain after less than five minutes. 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 Pain Assessment Currently in Pain?:  (prior to meds 5 or greater.) Pain Score:   3 Pain Location: Back Pain Orientation: Left Pain Radiating Towards: Right hip.  Pt Left side of her back hurts but has pain in her right hip. Pain Relieving Factors: sit down;  Effect of Pain on Daily Activities: increases  Multiple Pain Sites: Yes    Cognition/Observation Cognition Overall Cognitive Status: Appears within functional limits for tasks assessed   Assessment RLE  Strength Right Hip Flexion: 5/5 Right Hip Extension: 3/5 Right Hip ABduction: 5/5 Right Hip ADduction: 4/5 Right Knee Flexion: 5/5 Right Knee Extension: 5/5 Right Ankle Dorsiflexion: 5/5 LLE Strength Left Hip Flexion: 5/5 Left Hip Extension: 3/5 Left Hip ABduction: 4/5 Left Hip ADduction: 4/5 Left Knee Flexion: 5/5 Left Knee Extension: 5/5 Left Ankle Dorsiflexion: 5/5 Lumbar AROM Lumbar Flexion: wfl Lumbar Extension: wfl with reps increasing pain Lumbar - Right Side Bend: wfl Lumbar - Left Side Bend: wfl Lumbar - Right Rotation: decreased 25% Lumbar - Left Rotation: decreased 25%  Exercise/Treatments Mobility/Balance  Posture/Postural Control Posture/Postural Control: Postural limitations Postural Limitations: Pt has decreased kyphosis increased lordosis   Stretches Passive Hamstring Stretch:  (pelvic tilt x 5) Single Knee to Chest Stretch: 5 reps;20 seconds (towel assist.) Lower Trunk Rotation: 5 reps Lumbar Exercises   Stability Ab Set: 10 reps Machine Exercises       Physical Therapy Assessment and Plan PT Assessment and Plan Clinical Impression Statement: Pt with decreased core mm with flexion biased who will benefit from skilled PT to decrease sx of pain and improve funcitonal activity. Rehab Potential: Good Clinical Impairments Affecting Rehab Potential: weakness; pain; difficulty walking PT Frequency: Min 3X/week PT Duration: 4 weeks PT Treatment/Interventions: Therapeutic activities;Other (comment) (modalities as needed for pain) PT Plan: Next treatment begin bridge; clam; SLR, heel raise and functional squat; walking with abs tight; third begin side-lying AB and prone heel squeeze  Goals Home Exercise Program Pt will Perform Home Exercise Program: Independently PT Short Term Goals Time to Complete Short Term Goals: 2 weeks PT Short Term Goal 1: Pain to be no greater than a 3 PT Short Term Goal 2: Pt to be able to stand for 15 minutes without  increase pain PT Short Term Goal 3: Pt to be able to walk for ten minutes without increase pain PT Short Term Goal 4: Pt mm strength to be wnl PT Long Term Goals Time to Complete Long Term Goals: 4 weeks PT Long Term Goal 1: No hip pain back pain to be no greater than a 1 PT Long Term Goal 2: I in advance HEP Long Term Goal 3: Pt to be able to stand for 30 without increased pain Long Term Goal 4: Pt to be able to walk for 30 minutes without pain PT Long Term Goal 5: Pt to be able to show proper body mechanics when lifting.  Problem List Patient Active Problem List  Diagnoses  . Right breast cancer [id number] ER Positive  . Osteoarthritis of hip  . DDD (degenerative disc disease), lumbar  . Weakness of right leg  . Left leg weakness    PT - End of Session Activity Tolerance: Patient tolerated treatment well General Behavior During Session: [facility] for tasks performed Cognition: [facility] for tasks performed PT Plan of Care PT Home Exercise Plan: given Consulted and Agree with Plan of Care: Patient    Alexa Castillo 03/11/2011, 3:18 PM  Physician Documentation Your signature is required to indicate approval of the treatment plan as stated above.  Please sign and either send electronically or make a copy of this report for your files and return this physician signed original.   Please mark one 1.__approve of plan  2. ___approve of plan with the following conditions.   ______________________________                                                          _____________________ Physician Signature                                                                                                             Date

## 2011-03-26 ENCOUNTER — Other Ambulatory Visit (HOSPITAL_COMMUNITY): Payer: Self-pay | Admitting: Oncology

## 2011-03-26 DIAGNOSIS — C50919 Malignant neoplasm of unspecified site of unspecified female breast: Secondary | ICD-10-CM

## 2011-04-04 ENCOUNTER — Ambulatory Visit (INDEPENDENT_AMBULATORY_CARE_PROVIDER_SITE_OTHER): Payer: BC Managed Care – PPO | Admitting: General Surgery

## 2011-04-04 ENCOUNTER — Encounter (INDEPENDENT_AMBULATORY_CARE_PROVIDER_SITE_OTHER): Payer: Self-pay | Admitting: General Surgery

## 2011-04-04 VITALS — BP 134/76 | HR 68 | Temp 97.9°F | Resp 18 | Ht 62.0 in | Wt 242.4 lb

## 2011-04-04 DIAGNOSIS — C50319 Malignant neoplasm of lower-inner quadrant of unspecified female breast: Secondary | ICD-10-CM

## 2011-04-04 NOTE — Progress Notes (Signed)
Chief complaint: Followup breast cancer  History: Patient returns for routine followup now 9 months following right breast lumpectomy and negative axillary sentinel lymph node biopsy, radiation therapy and now on adjuvant Arimidex for T1 N07 millimeter invasive cancer of the right breast. She has been having some pain which has been a recurring problem for her and is improving after some therapy. She denies any problems related to her breast. Specifically no lump or skin change or nipple discharge. She does notice a little tenderness occasionally in the upper breast.  Exam: BP 134/76  Pulse 68  Temp(Src) 97.9 F (36.6 C) (Temporal)  Resp 18  Ht 5\' 2"  (1.575 m)  Wt 242 lb 6.4 oz (109.952 kg)  BMI 44.34 kg/m2  Gen.: Obese African female in no distress Skin: No rash or infection Lungs: Clear without wheezing or increased work of breathing Lymph nodes: No cervical, supraclavicular, or axillary lymph nodes palpable Breasts: Mild post radiation changes right breast. There are no palpable masses with particular attention to lumpectomy site. The skin lesions. Left breast is also negative.  Imaging: Due in the next couple of months  Assessment and plan: Doing well with no evidence of recurrent or new primary breast cancer. I asked her to return in 6 months.

## 2011-04-30 ENCOUNTER — Ambulatory Visit (HOSPITAL_COMMUNITY): Payer: BC Managed Care – PPO

## 2011-05-14 ENCOUNTER — Ambulatory Visit (HOSPITAL_COMMUNITY)
Admission: RE | Admit: 2011-05-14 | Discharge: 2011-05-14 | Disposition: A | Discharge status: Home / Self Care | Payer: BC Managed Care – PPO | Source: Ambulatory Visit | Attending: Oncology | Admitting: Oncology

## 2011-05-14 DIAGNOSIS — Z09 Encounter for follow-up examination after completed treatment for conditions other than malignant neoplasm: Secondary | ICD-10-CM | POA: Insufficient documentation

## 2011-05-14 DIAGNOSIS — C50919 Malignant neoplasm of unspecified site of unspecified female breast: Secondary | ICD-10-CM

## 2011-05-14 DIAGNOSIS — Z853 Personal history of malignant neoplasm of breast: Secondary | ICD-10-CM | POA: Insufficient documentation

## 2011-07-22 ENCOUNTER — Other Ambulatory Visit (HOSPITAL_COMMUNITY): Payer: Self-pay | Admitting: Oncology

## 2011-07-22 ENCOUNTER — Telehealth (HOSPITAL_COMMUNITY): Payer: Self-pay | Admitting: Oncology

## 2011-07-22 ENCOUNTER — Encounter (HOSPITAL_COMMUNITY): Payer: Self-pay | Admitting: Oncology

## 2011-07-22 DIAGNOSIS — C50319 Malignant neoplasm of lower-inner quadrant of unspecified female breast: Secondary | ICD-10-CM

## 2011-07-22 MED ORDER — ANASTROZOLE 1 MG PO TABS: 1.0000 mg | ORAL_TABLET | Freq: Every day | ORAL | Status: DC

## 2011-08-07 ENCOUNTER — Ambulatory Visit (HOSPITAL_COMMUNITY)
Admission: RE | Admit: 2011-08-07 | Discharge: 2011-08-07 | Disposition: A | Discharge status: Home / Self Care | Payer: BC Managed Care – PPO | Source: Ambulatory Visit | Attending: Oncology | Admitting: Oncology

## 2011-08-07 ENCOUNTER — Encounter (HOSPITAL_COMMUNITY): Payer: Self-pay | Admitting: Oncology

## 2011-08-07 ENCOUNTER — Encounter (HOSPITAL_COMMUNITY): Payer: BC Managed Care – PPO | Attending: Oncology | Admitting: Oncology

## 2011-08-07 VITALS — BP 129/82 | HR 104 | Temp 98.6°F | Wt 240.8 lb

## 2011-08-07 DIAGNOSIS — I10 Essential (primary) hypertension: Secondary | ICD-10-CM | POA: Insufficient documentation

## 2011-08-07 DIAGNOSIS — M545 Low back pain, unspecified: Secondary | ICD-10-CM | POA: Insufficient documentation

## 2011-08-07 DIAGNOSIS — E7800 Pure hypercholesterolemia, unspecified: Secondary | ICD-10-CM | POA: Insufficient documentation

## 2011-08-07 DIAGNOSIS — R9431 Abnormal electrocardiogram [ECG] [EKG]: Secondary | ICD-10-CM | POA: Insufficient documentation

## 2011-08-07 DIAGNOSIS — C50919 Malignant neoplasm of unspecified site of unspecified female breast: Secondary | ICD-10-CM | POA: Insufficient documentation

## 2011-08-07 DIAGNOSIS — C50319 Malignant neoplasm of lower-inner quadrant of unspecified female breast: Secondary | ICD-10-CM

## 2011-08-07 DIAGNOSIS — R079 Chest pain, unspecified: Secondary | ICD-10-CM

## 2011-08-07 DIAGNOSIS — M51379 Other intervertebral disc degeneration, lumbosacral region without mention of lumbar back pain or lower extremity pain: Secondary | ICD-10-CM | POA: Insufficient documentation

## 2011-08-07 DIAGNOSIS — E669 Obesity, unspecified: Secondary | ICD-10-CM | POA: Insufficient documentation

## 2011-08-07 DIAGNOSIS — E119 Type 2 diabetes mellitus without complications: Secondary | ICD-10-CM

## 2011-08-07 DIAGNOSIS — Z853 Personal history of malignant neoplasm of breast: Secondary | ICD-10-CM | POA: Insufficient documentation

## 2011-08-07 DIAGNOSIS — M5137 Other intervertebral disc degeneration, lumbosacral region: Secondary | ICD-10-CM | POA: Insufficient documentation

## 2011-08-07 DIAGNOSIS — Z79899 Other long term (current) drug therapy: Secondary | ICD-10-CM | POA: Insufficient documentation

## 2011-08-07 LAB — DIFFERENTIAL
Basophils Absolute: 0 10*3/uL (ref 0.0–0.1)
Basophils Relative: 0 % (ref 0–1)
Eosinophils Absolute: 0.3 10*3/uL (ref 0.0–0.7)
Monocytes Absolute: 0.6 10*3/uL (ref 0.1–1.0)
Monocytes Relative: 6 % (ref 3–12)
Neutro Abs: 7.1 10*3/uL (ref 1.7–7.7)

## 2011-08-07 LAB — COMPREHENSIVE METABOLIC PANEL
AST: 19 U/L (ref 0–37)
Albumin: 3.6 g/dL (ref 3.5–5.2)
BUN: 14 mg/dL (ref 6–23)
Calcium: 10.1 mg/dL (ref 8.4–10.5)
Creatinine, Ser: 0.76 mg/dL (ref 0.50–1.10)
Total Protein: 7.8 g/dL (ref 6.0–8.3)

## 2011-08-07 LAB — CBC
HCT: 39.1 % (ref 36.0–46.0)
Hemoglobin: 12.8 g/dL (ref 12.0–15.0)
MCH: 29.2 pg (ref 26.0–34.0)
MCHC: 32.7 g/dL (ref 30.0–36.0)
RDW: 14 % (ref 11.5–15.5)

## 2011-08-07 NOTE — Patient Instructions (Signed)
[facility]  Discharge Instructions Alexa Castillo  [id number] 1950-11-01 Dr. Glenford Peers  RECOMMENDATIONS MADE BY THE CONSULTANT AND ANY TEST RESULTS WILL BE SENT TO YOUR REFERRING DOCTOR.   EXAM FINDINGS BY MD TODAY AND SIGNS AND SYMPTOMS TO REPORT TO CLINIC OR PRIMARY MD:   We are going to get an x-ray of your back today to investigate the pain.  We will do an EKG today-your chest pain is of concern and we would also like for you to see your primary care Dr. Regarding this.  Lab work today.  INSTRUCTIONS GIVEN AND DISCUSSED:  Call us with any new lumps in your breast, nipple discharge or new bone pains  SPECIAL INSTRUCTIONS/FOLLOW-UP:  6 months to see Dr.  I acknowledge that I have been informed and understand all the instructions given to me and received a copy. I do not have any more questions at this time, but understand that I may call the [facility] at [facility] at [phone] during business hours should I have any further questions or need assistance in obtaining follow-up care.    __________________________________________  _____________  __________ Signature of Patient or Authorized Representative            Date                   Time    __________________________________________ Nurse's Signature

## 2011-08-07 NOTE — Progress Notes (Signed)
Alexa Linden, NP [phone] [place] [address]  1. Right breast cancer T1bN0M0 ER Positive  CBC, Differential, Comprehensive metabolic panel    CURRENT THERAPY: Started Arimidex 12/10/2010  INTERVAL HISTORY: Alexa Castillo 61 y.o. female returns for  regular  visit for followup of Stage IB, grade 2 invasive ductal carcinoma of the right breast. Status post lumpectomy and sentinel node biopsy by Dr. Johna Sheriff on 07/24/2010. Lymph node was negative. The tumor 7 mm in size, grade 2, very ER positive 100%, PR positive 100%, Ki-67 marker however was 50%. HER-2/neu was negative for amplification. We elected to give her postoperative radiation therapy and Arimidex, more for the opposite breast than for this cancer which has a good prognosis.  Started Arimidex 12/10/2010   Alexa Castillo is here for follow-up.  She is tolerating her Arimidex well.  She denies any arthralgias/myalgias, and explains that the hot flashes are improving.  She reports some low back pain with standing.  She is a Conservation officer, nature at [employer].  She explains that her back pain is not present at rest, but after 4 hours of working at [employer] and standing, she begin to have some back pain.  She describes it as aching.  We will perform a lumbar X-ray to rule out any metastatic disease.   She also notes some intermittent chest pain.  She reports that it happens occasionally without any identifiable pattern.  It occurs with exertion and rest.  She Reports that with rest it subsides.  She describes it as sternal aching pain.  She denies any SOB or diaphoresis.  She does have HTN and her obesity puts her at risk for cardiovascular disease in addition to her DM and  hypercholesterolemia.  Will perform EKG today and I have requested she follow-up with PCP regarding the chest pain.  This was dictated in her discharge instructions as well.  She denies any heart palpitations.   Otherwise, she is doing well.  She denies any breast  changes and nipple discharge.  She does perform self-breast exams and denies any changes.  I provided self-breast exam education.    Past Medical History  Diagnosis Date  . Cancer     breast  . Diabetes mellitus   . Hypertension   . Obesity   . Breast cancer   . Low back pain   . Arthritis     back/saw Dr. Romeo Apple    has Right breast cancer T1bN0M0 ER Positive; Osteoarthritis of hip; DDD (degenerative disc disease), lumbar; Weakness of right leg; and Left leg weakness on her problem list.      has no known allergies.  [name] does not currently have medications on file.  Past Surgical History  Procedure Date  . Tubal ligation   . Biopsy breast     Denies any headaches, dizziness, double vision, fevers, chills, night sweats, nausea, vomiting, diarrhea, constipation, heart palpitations, shortness of breath, blood in stool, black tarry stool, urinary pain, urinary burning, urinary frequency, hematuria.   PHYSICAL EXAMINATION  ECOG PERFORMANCE STATUS: 1 - Symptomatic but completely ambulatory  Filed Vitals:   08/07/11 1118  BP: 129/82  Pulse: 104  Temp: 98.6 F (37 C)    GENERAL:alert, no distress, well nourished, well developed, comfortable, cooperative, obese and smiling SKIN: skin color, texture, turgor are normal, no rashes or significant lesions HEAD: Normocephalic, No masses, lesions, tenderness or abnormalities EYES: normal, Conjunctiva are pink and non-injected EARS: External ears normal OROPHARYNX:lips, buccal mucosa, and tongue normal, mucous  membranes are moist and poor dentition  NECK: supple, no adenopathy, thyroid normal size, non-tender, without nodularity, no stridor, non-tender, trachea midline LYMPH:  no palpable lymphadenopathy BREAST:right breast normal without mass, skin or nipple changes or axillary nodes with some radiation changes appreciated with skin thickening and hyperpigmentation (right breast is smaller than left), left breast normal  without mass, skin or nipple changes or axillary nodes with some fibroglandular tissue appreciated in the lower quadrants of the left breast. LUNGS: clear to auscultation and percussion HEART: regular rate & rhythm, no murmurs, no gallops, S1 normal and S2 normal ABDOMEN:abdomen soft, non-tender, obese, normal bowel sounds, no masses or organomegaly, difficult to assess for hepatosplenomegaly due to body habitus, but none noted.  BACK: Back symmetric, no curvature., No CVA tenderness, tenderness noted to percussion of lumbar spine EXTREMITIES:less then 2 second capillary refill, no joint deformities, effusion, or inflammation, no skin discoloration, no clubbing, no cyanosis, positive findings:  edema 1+ pedal pitting edema.   NEURO: alert & oriented x 3 with fluent speech, no focal motor/sensory deficits, gait normal     PENDING LABS: CBC diff, CMET   RADIOGRAPHIC STUDIES:  05/14/2011  *RADIOLOGY REPORT*  Clinical Data: History of right breast cancer status post  lumpectomy 2012  DIGITAL DIAGNOSTIC BILATERAL MAMMOGRAM WITH CAD  Comparison: With priors  Findings: There are scattered fibroglandular densities.  Lumpectomy changes are seen in the right breast. There is no new  suspicious mass or malignant-type microcalcifications in either  breast.  Mammographic images were processed with CAD.  IMPRESSION:  No evidence of malignancy in either breast. Diagnostic mammogram in  1 year is recommended.  BI-RADS CATEGORY 2: Benign finding(s).  Original Report Authenticated By: Littie Deeds. Judyann Munson, M.D.    PATHOLOGY: 07/24/2010  Diagnosis 1. Lymph node, sentinel, biopsy, right axillary - THERE IS NO EVIDENCE OF CARCINOMA IN 1 OF 1 LYMPH NODE (0/1). 2. Breast, lumpectomy, right - INVASIVE DUCTAL CARCINOMA, GRADE II/III, SPANNING 0.7 CM. - DUCTAL CARCINOMA IN SITU, INTERMEDIATE GRADE. - DUCTAL CARCINOMA IN SITU IS FOCALLY 0.1 CM TO THE POSTERIOR MARGIN OF SPECIMEN #1 AND FOCALLY 0.2 CM TO THE  SUPERIOR MARGIN OF SPECIMEN #1. - LOBULAR NEOPLASIA (ATYPICAL LOBULAR HYPERPLASIA). - SEE ONCOLOGY TABLE BELOW. 3. Breast, excision, additional posterior medial margins - FIBROCYSTIC CHANGES. - THERE IS NO EVIDENCE OF MALIGNANCY. - SEE COMMENT. Microscopic Comment 2. BREAST, WITH LYMPH NODE SAMPLING Specimen, including laterality: Right breast Procedure: Needle localized lumpectomy Tumor size of largest invasive carcinoma (gross measurement): 0.7 cm Margins: Invasive component, distance to closest margin: Greater than 0.2 cm to all margins (glass slide measurement). In situ component, distance to closest margin: Focally 0.1 cm to the posterior margin of specimen #1 and focally 0.2 cm to the superior margin of specimen #1 Lymph - Vascular invasion: Not identified. Histologic type, invasive component: Ductal Grade, invasive component (Nottingham combined histologic score): II Tubule formation grade: 3 Nuclear pleomorphism grade: 2 Mitotic grade: 2 Ductal carcinoma in situ: Present Nuclear grade: Intermediate 1 of 3 FINAL for SELINE, ENZOR ([id number]) Microscopic Comment(continued) Necrosis: Not identified Extensive intraductal component: Yes Lobular neoplasia present: Yes, atypical lobular hyperplasia, focal Treatment effect (if treated with neoadjuvant therapy): N/A Multicentric (separate tumors in different quadrants): Unknown Multifocal (separate tumors in same quadrant or biopsy): No Macroscopic or microscopic extent of tumor: Confined to breast parenchyma. Axillary lymph nodes: Number examined: 1 Number with metastasis: 0 TNM Code: pT1b, pN0 Breast Prognostic Markers: [id number] Estrogen receptor: 100%, strong staining intensity. Progesterone receptor: 100%,  strong staining intensity. Ki 67 (Mib-1): 50% Her 2 neu by C[facility]: No amplification was identified. Her2 will be performed on the current case and the results reported separately. 3. The surgical resection  margins of the specimen were inked and microscopically evaluated. (JBK) Pecola Leisure MD Pathologist, Electronic Signature (Case signed 07/25/2010)    ASSESSMENT:  1. Stage IB, grade 2 invasive ductal carcinoma of the right breast. Status post lumpectomy and sentinel node biopsy by Dr. Johna Sheriff on 07/24/2010. Lymph node was negative. The tumor 7 mm in size, grade 2, very ER positive 100%, PR positive 100%, Ki-67 marker however was 50%. HER-2/neu was negative for amplification. We elected to give her postoperative radiation therapy and Arimidex, more for the opposite breast than for this cancer which has a good prognosis. Started Arimidex 12/10/2010 2. Obesity.  3. Hypertension.  4. Diabetes mellitus.  5. Poor dental hygiene.  6. Hypercholesterolemia on Pravachol.   PLAN:  1. I personally reviewed and went over laboratory results with the patient. 2. I personally reviewed and went over radiographic studies with the patient. 3. Lab work today: CBC diff, CMET 4. EKG today. 5. Lumbar spine X-ray to evaluate for metastasis.  6. Continue with Arimidex daily.  7. Follow-up with PCP regarding intermittent chest pain.  8. Continue follow-up with Dr. Romeo Apple for right hip pain.  9. Return in 6 months for follow-up.   All questions were answered. The patient knows to call the clinic with any problems, questions or concerns. We can certainly see the patient much sooner if necessary.  KEFALAS,THOMAS    Addendum:  EKG is unimpressive.  NSR with 91 BPM.  Decent r-wave progression through leads 1-6.     *RADIOLOGY REPORT*  Clinical Data: 61 year old female with history breast cancer. Low  back pain.  LUMBAR SPINE - COMPLETE 4+ VIEW  Comparison: None.  Findings: Lumbar segmentation. Dextroconvex lumbar scoliosis.  Diffusely advanced facet degeneration from L3-L4 to L5-S1. Trace  anterolisthesis throughout these levels. No pars fracture. Disc  spaces are relatively preserved except L5-S1. No  compression  fracture.  Bone mineralization accounting for degenerative changes and A2  appears within normal limits throughout the lumbar spine.  Visualized lower thoracic and pelvic bone mineralization is grossly  normal.  IMPRESSION:  1. No definite acute or metastatic osseous abnormality the lumbar  spine. If suspicion of metastatic disease persists, nuclear  medicine whole body bone scan or MRI would be far more sensitive.  2. Chronic advanced lumbar facet degeneration from L3-L4 to L5-S1.  Chronic L5-S1 disc degeneration.  Original Report Authenticated By: Harley Hallmark, M.D.      If low back pain continues or worsens, we can pursue a bone scan to verify that there is no bone lesions.   KEFALAS,THOMAS

## 2011-10-03 ENCOUNTER — Encounter (INDEPENDENT_AMBULATORY_CARE_PROVIDER_SITE_OTHER): Payer: Self-pay | Admitting: General Surgery

## 2011-10-03 ENCOUNTER — Ambulatory Visit (INDEPENDENT_AMBULATORY_CARE_PROVIDER_SITE_OTHER): Payer: BC Managed Care – PPO | Admitting: General Surgery

## 2011-10-03 VITALS — BP 134/80 | HR 68 | Temp 97.4°F | Resp 16 | Ht 62.0 in | Wt 237.8 lb

## 2011-10-03 DIAGNOSIS — C50319 Malignant neoplasm of lower-inner quadrant of unspecified female breast: Secondary | ICD-10-CM

## 2011-10-03 NOTE — Progress Notes (Signed)
Chief complaint: Followup cancer of the breast  History:Patient returns for routine followup now over one year following right breast lumpectomy and negative axillary sentinel lymph node biopsy, radiation therapy and now on adjuvant Arimidex for T1 N0 7 millimeter invasive cancer of the right breast. She reports she is doing well. She gets an occasional brief mild pain in her treated breast denies any significant persistent discomfort, lump, skin change or nipple discharge. She has had some significant back pain but has had x-rays and evaluation done with findings of arthritis. She remains on her arimadex with the only noticeable side effect being hot flashes.  Exam: BP 134/80  Pulse 68  Temp 97.4 F (36.3 C) (Temporal)  Resp 16  Ht 5\' 2"  (1.575 m)  Wt 237 lb 12.8 oz (107.865 kg)  BMI 43.49 kg/m2 General: Obese but otherwise well-appearing African American female Skin: No rash or infection Lymph nodes: No cervical, supraclavicular, or axillary nodes palpable Breasts: Mild postradiation changes in the right breast. No palpable masses with particular attention to the lumpectomy site. No other abnormalities either breast.  Imaging: Mammogram negative in May of this year  Assessment and plan: Doing well with no evidence of early recurrence following treatment of stage I cancer of the right breast. Return in 6 months.

## 2012-01-19 ENCOUNTER — Other Ambulatory Visit (HOSPITAL_COMMUNITY): Payer: Self-pay | Admitting: Oncology

## 2012-01-19 ENCOUNTER — Telehealth (HOSPITAL_COMMUNITY): Payer: Self-pay | Admitting: Oncology

## 2012-01-19 DIAGNOSIS — C50319 Malignant neoplasm of lower-inner quadrant of unspecified female breast: Secondary | ICD-10-CM

## 2012-01-19 MED ORDER — ANASTROZOLE 1 MG PO TABS: 1.0000 mg | ORAL_TABLET | Freq: Every day | ORAL | Status: DC

## 2012-02-11 ENCOUNTER — Ambulatory Visit (HOSPITAL_COMMUNITY): Payer: BC Managed Care – PPO | Admitting: Oncology

## 2012-02-16 ENCOUNTER — Encounter (HOSPITAL_COMMUNITY): Payer: BC Managed Care – PPO | Attending: Oncology | Admitting: Oncology

## 2012-02-16 VITALS — BP 139/80 | HR 96 | Temp 98.0°F | Resp 18 | Wt 240.0 lb

## 2012-02-16 DIAGNOSIS — Z853 Personal history of malignant neoplasm of breast: Secondary | ICD-10-CM | POA: Insufficient documentation

## 2012-02-16 DIAGNOSIS — E669 Obesity, unspecified: Secondary | ICD-10-CM | POA: Insufficient documentation

## 2012-02-16 DIAGNOSIS — Z170 Estrogen receptor positive status [ER+]: Secondary | ICD-10-CM

## 2012-02-16 DIAGNOSIS — I10 Essential (primary) hypertension: Secondary | ICD-10-CM | POA: Insufficient documentation

## 2012-02-16 DIAGNOSIS — M5136 Other intervertebral disc degeneration, lumbar region: Secondary | ICD-10-CM

## 2012-02-16 DIAGNOSIS — E119 Type 2 diabetes mellitus without complications: Secondary | ICD-10-CM

## 2012-02-16 DIAGNOSIS — E7800 Pure hypercholesterolemia, unspecified: Secondary | ICD-10-CM | POA: Insufficient documentation

## 2012-02-16 DIAGNOSIS — M545 Low back pain, unspecified: Secondary | ICD-10-CM

## 2012-02-16 DIAGNOSIS — C50319 Malignant neoplasm of lower-inner quadrant of unspecified female breast: Secondary | ICD-10-CM

## 2012-02-16 DIAGNOSIS — Z09 Encounter for follow-up examination after completed treatment for conditions other than malignant neoplasm: Secondary | ICD-10-CM | POA: Insufficient documentation

## 2012-02-16 DIAGNOSIS — M51369 Other intervertebral disc degeneration, lumbar region without mention of lumbar back pain or lower extremity pain: Secondary | ICD-10-CM

## 2012-02-16 LAB — CBC WITH DIFFERENTIAL/PLATELET
Eosinophils Relative: 3 % (ref 0–5)
HCT: 39.1 % (ref 36.0–46.0)
Hemoglobin: 12.7 g/dL (ref 12.0–15.0)
Lymphocytes Relative: 21 % (ref 12–46)
Lymphs Abs: 2.2 10*3/uL (ref 0.7–4.0)
MCH: 28.6 pg (ref 26.0–34.0)
MCV: 88.1 fL (ref 78.0–100.0)
Monocytes Relative: 5 % (ref 3–12)
Platelets: 321 10*3/uL (ref 150–400)
RBC: 4.44 MIL/uL (ref 3.87–5.11)
WBC: 10.8 10*3/uL — ABNORMAL HIGH (ref 4.0–10.5)

## 2012-02-16 LAB — COMPREHENSIVE METABOLIC PANEL
ALT: 19 U/L (ref 0–35)
Alkaline Phosphatase: 106 U/L (ref 39–117)
BUN: 15 mg/dL (ref 6–23)
CO2: 26 mEq/L (ref 19–32)
Calcium: 9.6 mg/dL (ref 8.4–10.5)
GFR calc Af Amer: >90 mL/min (ref >90)
GFR calc non Af Amer: >90 mL/min (ref >90)
Glucose, Bld: 140 mg/dL — ABNORMAL HIGH (ref 70–99)
Sodium: 139 mEq/L (ref 135–145)

## 2012-02-16 NOTE — Patient Instructions (Addendum)
.  [facility] Discharge Instructions  RECOMMENDATIONS MADE BY THE CONSULTANT AND ANY TEST RESULTS WILL BE SENT TO YOUR REFERRING PHYSICIAN.  EXAM FINDINGS BY THE PHYSICIAN TODAY AND SIGNS OR SYMPTOMS TO REPORT TO CLINIC OR PRIMARY PHYSICIAN: exam per T. Jacalyn Lefevre PA  MEDICATIONS PRESCRIBED:  No changes  INSTRUCTIONS GIVEN AND DISCUSSED: Report any new or different pain  SPECIAL INSTRUCTIONS/FOLLOW-UP: Labs today See [place] in 6 months  Thank you for choosing [facility] to provide your oncology and hematology care.  To afford each patient quality time with our providers, please arrive at least 15 minutes before your scheduled appointment time.  With your help, our goal is to use those 15 minutes to complete the necessary work-up to ensure our physicians have the information they need to help with your evaluation and healthcare recommendations.    Effective January 1st, 2014, we ask that you re-schedule your appointment with our physicians should you arrive 10 or more minutes late for your appointment.  We strive to give you quality time with our providers, and arriving late affects you and other patients whose appointments are after yours.    Again, thank you for choosing [facility].  Our hope is that these requests will decrease the amount of time that you wait before being seen by our physicians.       _____________________________________________________________  Should you have questions after your visit to [facility], please contact our office at [phone] between the hours of 8:30 a.m. and 5:00 p.m.  Voicemails left after 4:30 p.m. will not be returned until the following business day.  For prescription refill requests, have your pharmacy contact our office with your prescription refill request.

## 2012-02-16 NOTE — Progress Notes (Signed)
[name], FNP [address] [place] [address]  1. Right breast cancer T1bN0M0 ER Positive   2. DDD (degenerative disc disease), lumbar     CURRENT THERAPY: On Arimidex daily  INTERVAL HISTORY: Alexa Castillo 62 y.o. female returns for  regular  visit for followup of Stage IB, grade 2 invasive ductal carcinoma of the right breast. Status post lumpectomy and sentinel node biopsy by Dr. Johna Sheriff on 07/24/2010. Lymph node was negative. The tumor 7 mm in size, grade 2, very ER positive 100%, PR positive 100%, Ki-67 marker however was 50%. HER-2/neu was negative for amplification. We elected to give her postoperative radiation therapy and Arimidex, more for the opposite breast than for this cancer which has a good prognosis. Started Arimidex 12/10/2010.  She continues to have lumbar spine pain.  She denies any laterality of this pain and she points to her spine. She reports that occasionally, a particular movement makes her pain worse and sounds muscular in nature because she reports that it is a quick jolt of pain that causes her to become in a fixed position.  I performed a Lumbar Xray in July 2013 to evaluate for metastatic disease and this test was negative except for some lumbar facet degeneration (advanced).  She has seen her PCP about this pain and it was correlated with her increased weight.  She has seen Dr. Romeo Apple in the past who wanted to start her on physical therapy but she reports she had it in the past without improvement.  So I have asked her to contact her PCP or Dr. Romeo Apple in the future if the pain continues or worsens.  We would be happy to exam her back further if the discomfort worsens.   She otherwise is doing well.  She admits to some hot flashes that are improving.  She denies any arthralgias and myalgias.   Oncologically, she is doing well and denies any oncologic complaints.    Past Medical History  Diagnosis Date  . Cancer     breast  .  Diabetes mellitus   . Hypertension   . Obesity   . Breast cancer   . Low back pain   . Arthritis     back/saw Dr. Romeo Apple    has Right breast cancer T1bN0M0 ER Positive; Osteoarthritis of hip; DDD (degenerative disc disease), lumbar; Weakness of right leg; and Left leg weakness on her problem list.      has no known allergies.  Ms. Barba does not currently have medications on file.  Past Surgical History  Procedure Date  . Tubal ligation   . Biopsy breast     Denies any headaches, dizziness, double vision, fevers, chills, night sweats, nausea, vomiting, diarrhea, constipation, chest pain, heart palpitations, shortness of breath, blood in stool, black tarry stool, urinary pain, urinary burning, urinary frequency, hematuria.   PHYSICAL EXAMINATION  ECOG PERFORMANCE STATUS: 1 - Symptomatic but completely ambulatory  Filed Vitals:   02/16/12 1238  BP: 139/80  Pulse: 96  Temp: 98 F (36.7 C)  Resp: 18    GENERAL:alert, no distress, well nourished, well developed, comfortable, cooperative, obese and smiling SKIN: skin color, texture, turgor are normal, no rashes or significant lesions HEAD: Normocephalic, No masses, lesions, tenderness or abnormalities EYES: normal, Conjunctiva are pink and non-injected EARS: External ears normal OROPHARYNX:mucous membranes are moist  NECK: supple, no adenopathy, thyroid normal size, non-tender, without nodularity, no stridor, non-tender, trachea midline LYMPH:  no palpable lymphadenopathy, no hepatosplenomegaly BREAST:breasts  appear normal, no suspicious masses, no skin or nipple changes or axillary nodes LUNGS: clear to auscultation and percussion HEART: regular rate & rhythm, no murmurs, no gallops, S1 normal and S2 normal ABDOMEN:abdomen soft, non-tender, obese, normal bowel sounds, no masses or organomegaly and no hepatosplenomegaly BACK: Back symmetric, no curvature., No CVA tenderness EXTREMITIES:less then 2 second capillary refill,  no joint deformities, effusion, or inflammation, no edema, no skin discoloration, no clubbing, no cyanosis  NEURO: alert & oriented x 3 with fluent speech, no focal motor/sensory deficits, gait normal    LABORATORY DATA: CBC    Component Value Date/Time   WBC 10.4 08/07/2011 1216   RBC 4.39 08/07/2011 1216   HGB 12.8 08/07/2011 1216   HCT 39.1 08/07/2011 1216   PLT 299 08/07/2011 1216   MCV 89.1 08/07/2011 1216   MCH 29.2 08/07/2011 1216   MCHC 32.7 08/07/2011 1216   RDW 14.0 08/07/2011 1216   LYMPHSABS 2.3 08/07/2011 1216   MONOABS 0.6 08/07/2011 1216   EOSABS 0.3 08/07/2011 1216   BASOSABS 0.0 08/07/2011 1216      Chemistry      Component Value Date/Time   NA 137 08/07/2011 1216   K 4.0 08/07/2011 1216   CL 102 08/07/2011 1216   CO2 25 08/07/2011 1216   BUN 14 08/07/2011 1216   CREATININE 0.76 08/07/2011 1216      Component Value Date/Time   CALCIUM 10.1 08/07/2011 1216   ALKPHOS 99 08/07/2011 1216   AST 19 08/07/2011 1216   ALT 24 08/07/2011 1216   BILITOT 0.3 08/07/2011 1216        RADIOGRAPHIC STUDIES:  08/07/11  *RADIOLOGY REPORT*  Clinical Data: 62 year old female with history breast cancer. Low  back pain.  LUMBAR SPINE - COMPLETE 4+ VIEW  Comparison: None.  Findings: Lumbar segmentation. Dextroconvex lumbar scoliosis.  Diffusely advanced facet degeneration from L3-L4 to L5-S1. Trace  anterolisthesis throughout these levels. No pars fracture. Disc  spaces are relatively preserved except L5-S1. No compression  fracture.  Bone mineralization accounting for degenerative changes and A2  appears within normal limits throughout the lumbar spine.  Visualized lower thoracic and pelvic bone mineralization is grossly  normal.  IMPRESSION:  1. No definite acute or metastatic osseous abnormality the lumbar  spine. If suspicion of metastatic disease persists, nuclear  medicine whole body bone scan or MRI would be far more sensitive.  2. Chronic advanced lumbar facet degeneration  from L3-L4 to L5-S1.  Chronic L5-S1 disc degeneration.  Original Report Authenticated By: Harley Hallmark, M.D.     ASSESSMENT:  1. Stage IB, grade 2 invasive ductal carcinoma of the right breast. Status post lumpectomy and sentinel node biopsy by Dr. Johna Sheriff on 07/24/2010. Lymph node was negative. The tumor 7 mm in size, grade 2, very ER positive 100%, PR positive 100%, Ki-67 marker however was 50%. HER-2/neu was negative for amplification. We elected to give her postoperative radiation therapy and Arimidex, more for the opposite breast than for this cancer which has a good prognosis. Started Arimidex 12/10/2010  2. Obesity.  3. Hypertension.  4. Diabetes mellitus.  5. Poor dental hygiene.  6. Hypercholesterolemia on Pravachol.   PLAN:  1. I personally reviewed and went over laboratory results with the patient. 2. Patient encouraged to continue NSAIDS 3. Patient encouraged to follow-up with PCP and potentially Orthopod regarding this pain. 4. Patient encouraged to contact the clinic if pain suddenly worsens or has loss of LE movement and or bowel/bladder control. 5. Patient  encouraged to lose some weight which is likely contributing to her back discomfort.  6. Lab work today: CBC diff, CMET 7. I personally reviewed and went over radiographic studies with the patient. 8. Continue Arimidex daily. 9. Return in 6 months for follow-up.   All questions were answered. The patient knows to call the clinic with any problems, questions or concerns. We can certainly see the patient much sooner if necessary.  The patient and plan discussed with Glenford Peers, MD and he is in agreement with the aforementioned.  KEFALAS,THOMAS

## 2012-02-16 NOTE — Progress Notes (Signed)
Alexa Castillo presented for labwork. Labs per MD order drawn via Peripheral Line 25 gauge needle inserted in lt ac  Good blood return present. Procedure without incident.  Needle removed intact. Patient tolerated procedure well.

## 2012-04-23 ENCOUNTER — Other Ambulatory Visit (HOSPITAL_COMMUNITY): Payer: Self-pay | Admitting: Family Medicine

## 2012-04-23 DIAGNOSIS — C50911 Malignant neoplasm of unspecified site of right female breast: Secondary | ICD-10-CM

## 2012-05-19 ENCOUNTER — Ambulatory Visit (HOSPITAL_COMMUNITY)
Admission: RE | Admit: 2012-05-19 | Discharge: 2012-05-19 | Disposition: A | Discharge status: Home / Self Care | Payer: BC Managed Care – PPO | Source: Ambulatory Visit | Attending: Family Medicine | Admitting: Family Medicine

## 2012-05-19 DIAGNOSIS — C50911 Malignant neoplasm of unspecified site of right female breast: Secondary | ICD-10-CM

## 2012-05-19 DIAGNOSIS — Z853 Personal history of malignant neoplasm of breast: Secondary | ICD-10-CM | POA: Insufficient documentation

## 2012-07-19 ENCOUNTER — Other Ambulatory Visit (HOSPITAL_COMMUNITY): Payer: Self-pay | Admitting: Oncology

## 2012-07-19 ENCOUNTER — Telehealth (HOSPITAL_COMMUNITY): Payer: Self-pay | Admitting: Oncology

## 2012-07-19 DIAGNOSIS — C50311 Malignant neoplasm of lower-inner quadrant of right female breast: Secondary | ICD-10-CM

## 2012-07-19 MED ORDER — ANASTROZOLE 1 MG PO TABS: 1.0000 mg | ORAL_TABLET | Freq: Every day | ORAL | Status: DC

## 2012-08-16 ENCOUNTER — Encounter (HOSPITAL_COMMUNITY): Payer: BC Managed Care – PPO | Attending: Internal Medicine

## 2012-08-16 ENCOUNTER — Encounter (HOSPITAL_COMMUNITY): Payer: Self-pay

## 2012-08-16 VITALS — BP 147/93 | HR 93 | Temp 98.4°F | Resp 18 | Wt 236.9 lb

## 2012-08-16 DIAGNOSIS — M549 Dorsalgia, unspecified: Secondary | ICD-10-CM

## 2012-08-16 DIAGNOSIS — C50319 Malignant neoplasm of lower-inner quadrant of unspecified female breast: Secondary | ICD-10-CM

## 2012-08-16 DIAGNOSIS — Z170 Estrogen receptor positive status [ER+]: Secondary | ICD-10-CM

## 2012-08-16 DIAGNOSIS — M25559 Pain in unspecified hip: Secondary | ICD-10-CM

## 2012-08-16 DIAGNOSIS — C50311 Malignant neoplasm of lower-inner quadrant of right female breast: Secondary | ICD-10-CM

## 2012-08-16 NOTE — Progress Notes (Signed)
[name], FNP [address] [place] [address]  DIAGNOSIS:  H/o Stage IB, grade 2 invasive ductal carcinoma of the right breast. Status post lumpectomy and sentinel node biopsy by Dr. Johna Sheriff on 07/24/2010. Lymph node was negative. The tumor 7 mm in size, grade 2, very ER positive 100%, PR positive 100%, Ki-67 marker however was 50%. HER-2/neu was negative for amplification. Got postoperative radiation therapy and Arimidex, more for the opposite breast than for this cancer which has a good prognosis. Started Arimidex 12/10/2010.  CURRENT THERAPY: On hormonal therapy with Arimidex daily  INTERVAL HISTORY: KENYAH LUBA 62 y.o. female returns for continued oncology evaluation, she was last seen here about 5-6 months ago. Overall states that she is doing steady, takes Arimidex 1 mg daily and denies any new side effects from this with the exception of persistent low back pain and sometimes bilateral hip pain especially when she stands for longer hours at work, states that symptoms improved when she is sitting/resting. She had lumbar x-ray about one year ago which showed degenerative disc disease. She does not remember if she has had bone density scan in the past. Denies any other new joint pains or stiffness. Hot flashes are doing better on their own. Denies feeling any new breast masses on self-exam. No new headaches, imbalance or falls. No new cough, dyspnea, chest pain or hemoptysis. Denies new mood disturbances. States that she continues to follow with her primary physician at [facility] for diabetes, hypertension, hyperlipidemia and other medical issues.    Past Medical History  Diagnosis Date  . Cancer     breast  . Diabetes mellitus   . Hypertension   . Obesity   . Breast cancer   . Low back pain   . Arthritis     back/saw Dr. Romeo Apple    has Right breast cancer [id number] ER Positive; Osteoarthritis of hip; DDD (degenerative disc disease), lumbar; Weakness of  right leg; and Left leg weakness on her problem list.  Allergies: has No Known Allergies.  Medications:  anastrozole (ARIMIDEX) 1 MG tablet 1 mg, Daily glipiZIDE (GLUCOTROL) 10 MG tablet 2 times daily before meals ibuprofen (ADVIL,MOTRIN) 200 MG tablet 200 mg, Every 6 hours PRN lisinopril (PRINIVIL,ZESTRIL) 20 MG tablet Daily metFORMIN (GLUCOPHAGE) 500 MG tablet 1,000 mg, 2 times daily with meals pravastatin (PRAVACHOL) 20 MG tablet Daily at bedtime   Past Surgical History  Procedure Laterality Date  . Tubal ligation    . Biopsy breast      ROS:  As in history of present illness above. In addition no fevers or night sweats. No new headaches or focal weakness. No sore throat or dysphagia. No new cough, sputum, hemoptysis or chest pain. No dyspnea, palpitation, orthopnea or PND. No new abdominal pain, diarrhea, constipation, blood in stools. No dysuria or hematuria. No new paresthesias in extremities. No polyuria polydipsia. ECOG PS 1.    PHYSICAL EXAMINATION:  Filed Vitals:   08/16/12 1346  BP: 147/93  Pulse: 93  Temp: 98.4 F (36.9 C)  Resp: 18   GENERAL: Patient is alert and oriented, in no acute distress. No icterus. HEENT : EOMs intact. No oral thrush. No cervical or supraclavicular adenopathy. CVS : S1-S2, regular rate and rhythm  LUNGS : Bilaterally good air entry, no crepitations or rhonchi  ABDOMEN : Soft, nontender, no hepatomegaly clinically  EXTREMITIES : No major edema, no cyanosis  BREASTS : No abnormal masses or tenderness in either breast. No axillary adenopathy on either  side. Exam performed in presence of a nurse.    LABORATORY DATA: CBC    Component Value Date/Time   WBC 10.8* 02/16/2012 1311   RBC 4.44 02/16/2012 1311   HGB 12.7 02/16/2012 1311   HCT 39.1 02/16/2012 1311   PLT 321 02/16/2012 1311   MCV 88.1 02/16/2012 1311   MCH 28.6 02/16/2012 1311   MCHC 32.5 02/16/2012 1311   RDW 14.1 02/16/2012 1311   LYMPHSABS 2.2 02/16/2012 1311   MONOABS 0.5 02/16/2012 1311    EOSABS 0.3 02/16/2012 1311   BASOSABS 0.0 02/16/2012 1311      Chemistry      Component Value Date/Time   NA 139 02/16/2012 1311   K 3.6 02/16/2012 1311   CL 104 02/16/2012 1311   CO2 26 02/16/2012 1311   BUN 15 02/16/2012 1311   CREATININE 0.70 02/16/2012 1311      Component Value Date/Time   CALCIUM 9.6 02/16/2012 1311   ALKPHOS 106 02/16/2012 1311   AST 17 02/16/2012 1311   ALT 19 02/16/2012 1311   BILITOT 0.2* 02/16/2012 1311        RADIOGRAPHIC STUDIES: 08/07/11  LUMBAR SPINE - COMPLETE 4+ VIEW.  IMPRESSION:  1. No definite acute or metastatic osseous abnormality the lumbar spine. If suspicion of metastatic disease persists, nuclear medicine whole body bone scan or MRI would be far more sensitive.  2. Chronic advanced lumbar facet degeneration from L3-L4 to L5-S1. Chronic L5-S1 disc degeneration. Original Report Authenticated By: Harley Hallmark, M.D.  05/19/12. DIGITAL DIAGNOSTIC BILATERAL MAMMOGRAM WITH CAD  Comparison: With priors  Findings: ACR Breast Density Category 2: There is a scattered fibroglandular pattern. There are stable lumpectomy changes in the right breast. There is no new suspicious mass or malignant-type microcalcifications in either breast. Mammographic images were processed with CAD.  IMPRESSION:  No evidence of malignancy in either breast.  RECOMMENDATION: Bilateral diagnostic mammogram in 1 year is recommended.  BI-RADS CATEGORY 2: Benign finding(s).    ASSESSMENT / PLAN:  Stage IB, grade 2 invasive ductal carcinoma of the right breast. Status post lumpectomy and sentinel node biopsy by Dr. Johna Sheriff on 07/24/2010. Lymph node was negative. The tumor 7 mm in size, grade 2, very ER positive 100%, PR positive 100%, Ki-67 marker however was 50%. HER-2/neu was negative for amplification. Got postoperative radiation therapy and is on hormonal therapy with Arimidex since 12/10/10 - Patient is doing well with no clinical evidence to suggest recurrent/metastatic breast cancer. Recent  surveillance mammogram in May 2014 was reported benign findings, BI-RADS 2. Patient states that she had labs drawn at her primary physician's office about 1-2 weeks ago and prefers not to get blood drawn again today, will request a copy of the lab reports for our record here. She does have low back and hip pain and has been on Arimidex since November 2012, will request primary physician's office to pursue DEXA scan for osteoporosis surveillance given that she is on high-risk medication for this. She was also advised to take calcium plus vitamin D for osteoporosis prevention. Have discussed about other hormonal therapy options available for breast cancer but the patient does not want to change treatment at this time. Plan therefore is to continue on Arimidex 1 mg daily, she will return for followup in 6 months with labs including CBC, creatinine, LFT for continued surveillance of breast cancer. In between visits, she was advised to call in case of any progressive back pain, joint symptoms, new breast masses felt on self-exam  and will need to be evaluated sooner. Patient is agreeable to this plan.  Merville Hijazi

## 2012-08-16 NOTE — Patient Instructions (Addendum)
..  [facility] Discharge Instructions  RECOMMENDATIONS MADE BY THE CONSULTANT AND ANY TEST RESULTS WILL BE SENT TO YOUR REFERRING PHYSICIAN.  EXAM FINDINGS BY THE PHYSICIAN TODAY AND SIGNS OR SYMPTOMS TO REPORT TO CLINIC OR PRIMARY PHYSICIAN: Exam per Dr. Sherrlyn Hock  Continue arimidex. If back and hip pain continue to worsen let us know we made need to switch pill. INSTRUCTIONS GIVEN AND DISCUSSED: Yearly mammogram Labs requested from your primary care office We will request your MD to order bone density   SPECIAL INSTRUCTIONS/FOLLOW-UP: 6 months labs and Dr. appt  Thank you for choosing [facility] to provide your oncology and hematology care.  To afford each patient quality time with our providers, please arrive at least 15 minutes before your scheduled appointment time.  With your help, our goal is to use those 15 minutes to complete the necessary work-up to ensure our physicians have the information they need to help with your evaluation and healthcare recommendations.    Effective January 1st, 2014, we ask that you re-schedule your appointment with our physicians should you arrive 10 or more minutes late for your appointment.  We strive to give you quality time with our providers, and arriving late affects you and other patients whose appointments are after yours.    Again, thank you for choosing [facility].  Our hope is that these requests will decrease the amount of time that you wait before being seen by our physicians.       _____________________________________________________________  Should you have questions after your visit to [facility], please contact our office at [phone] between the hours of 8:30 a.m. and 5:00 p.m.  Voicemails left after 4:30 p.m. will not be returned until the following business day.  For prescription refill requests, have your pharmacy contact our office with your prescription refill request.

## 2013-01-10 ENCOUNTER — Emergency Department (HOSPITAL_COMMUNITY): Payer: Self-pay

## 2013-01-10 ENCOUNTER — Emergency Department (HOSPITAL_COMMUNITY)
Admission: EM | Admit: 2013-01-10 | Discharge: 2013-01-10 | Disposition: A | Discharge status: Home / Self Care | Payer: Self-pay | Attending: Emergency Medicine | Admitting: Emergency Medicine

## 2013-01-10 ENCOUNTER — Encounter (HOSPITAL_COMMUNITY): Payer: Self-pay | Admitting: Emergency Medicine

## 2013-01-10 DIAGNOSIS — Z791 Long term (current) use of non-steroidal anti-inflammatories (NSAID): Secondary | ICD-10-CM | POA: Insufficient documentation

## 2013-01-10 DIAGNOSIS — M129 Arthropathy, unspecified: Secondary | ICD-10-CM | POA: Insufficient documentation

## 2013-01-10 DIAGNOSIS — E119 Type 2 diabetes mellitus without complications: Secondary | ICD-10-CM | POA: Insufficient documentation

## 2013-01-10 DIAGNOSIS — R1310 Dysphagia, unspecified: Secondary | ICD-10-CM | POA: Insufficient documentation

## 2013-01-10 DIAGNOSIS — Z853 Personal history of malignant neoplasm of breast: Secondary | ICD-10-CM | POA: Insufficient documentation

## 2013-01-10 DIAGNOSIS — Z87891 Personal history of nicotine dependence: Secondary | ICD-10-CM | POA: Insufficient documentation

## 2013-01-10 DIAGNOSIS — I10 Essential (primary) hypertension: Secondary | ICD-10-CM | POA: Insufficient documentation

## 2013-01-10 DIAGNOSIS — Z79899 Other long term (current) drug therapy: Secondary | ICD-10-CM | POA: Insufficient documentation

## 2013-01-10 DIAGNOSIS — E669 Obesity, unspecified: Secondary | ICD-10-CM | POA: Insufficient documentation

## 2013-01-10 LAB — BASIC METABOLIC PANEL
Calcium: 10.5 mg/dL (ref 8.4–10.5)
Chloride: 102 mEq/L (ref 96–112)
Creatinine, Ser: 0.8 mg/dL (ref 0.50–1.10)
GFR calc Af Amer: 89 mL/min — ABNORMAL LOW (ref >90)
Sodium: 139 mEq/L (ref 135–145)

## 2013-01-10 MED ORDER — IOHEXOL 300 MG/ML  SOLN
75.0000 mL | Freq: Once | INTRAMUSCULAR | Status: AC | PRN
  Administered 2013-01-10: 75 mL via INTRAVENOUS

## 2013-01-10 MED ORDER — MAGIC MOUTHWASH: 5.0000 mL | Freq: Three times a day (TID) | ORAL | Status: DC | PRN

## 2013-01-10 NOTE — ED Notes (Signed)
Says she is having problem swallowing for 6 days, Feels "like its swollen"

## 2013-01-11 NOTE — ED Provider Notes (Signed)
CSN: [id number]     Arrival date & time 01/10/13  1628 History   First MD Initiated Contact with Patient 01/10/13 1809     Chief Complaint  Patient presents with  . Sore Throat   (Consider location/radiation/quality/duration/timing/severity/associated sxs/prior Treatment) Patient is a 62 y.o. female presenting with pharyngitis. The history is provided by the patient.  Sore Throat This is a new problem. Episode onset: one week. The problem occurs constantly. The problem has been waxing and waning. Pertinent negatives include no abdominal pain, arthralgias, change in bowel habit, chest pain, chills, congestion, coughing, fever, headaches, joint swelling, myalgias, nausea, neck pain, numbness, rash, sore throat, swollen glands, visual change, vomiting or weakness. The symptoms are aggravated by swallowing. She has tried nothing for the symptoms. The treatment provided no relief.    Patient reports hx of difficulty swallowing for one week.  She describes a feeling "like something swollen in the left side of my throat".  She states that she was seen by her PMD earlier today and advised to come to ED for further evaluation.  She states that she is able to tolerate fluids and foods and symptoms "come and go" .  She denies vomiting, fever, chills, neck pain, headaches, visual changes or extremity weakness.     Past Medical History  Diagnosis Date  . Diabetes mellitus   . Hypertension   . Obesity   . Low back pain   . Arthritis     back/saw Dr. Romeo Apple  . Cancer     breast  . Breast cancer    Past Surgical History  Procedure Laterality Date  . Tubal ligation    . Biopsy breast     Family History  Problem Relation Age of Onset  . Liver cancer Mother   . Cancer Mother   . Diabetes Mother   . Breast cancer Sister   . Cancer Sister    History  Substance Use Topics  . Smoking status: Former Smoker    Quit date: 12/18/1994  . Smokeless tobacco: Never Used  . Alcohol Use: No   OB  History   Grav Para Term Preterm Abortions TAB SAB Ect Mult Living                 Review of Systems  Constitutional: Negative for fever, chills, activity change and appetite change.  HENT: Positive for trouble swallowing. Negative for congestion, drooling, ear pain, facial swelling, mouth sores, sore throat and voice change.   Eyes: Negative for pain and visual disturbance.  Respiratory: Negative for cough, shortness of breath, wheezing and stridor.   Cardiovascular: Negative for chest pain.  Gastrointestinal: Negative for nausea, vomiting, abdominal pain and change in bowel habit.  Musculoskeletal: Negative for arthralgias, joint swelling, myalgias, neck pain and neck stiffness.  Skin: Negative for color change and rash.  Neurological: Negative for dizziness, syncope, facial asymmetry, speech difficulty, weakness, light-headedness, numbness and headaches.  Hematological: Negative for adenopathy.  All other systems reviewed and are negative.    Allergies  Review of patient's allergies indicates no known allergies.  Home Medications   Current Outpatient Rx  Name  Route  Sig  Dispense  Refill  . calcium-vitamin D (OSCAL WITH D) 500-200 MG-UNIT per tablet   Oral   Take 1 tablet by mouth 2 (two) times daily.         Marland Kitchen glipiZIDE (GLUCOTROL) 10 MG tablet   Oral   Take 10 mg by mouth 2 (two) times daily before a meal.          .  ibuprofen (ADVIL,MOTRIN) 200 MG tablet   Oral   Take 400 mg by mouth every 6 (six) hours as needed.          Marland Kitchen lisinopril (PRINIVIL,ZESTRIL) 20 MG tablet      daily.          . meloxicam (MOBIC) 7.5 MG tablet   Oral   Take 7.5 mg by mouth 2 (two) times daily.         . metFORMIN (GLUCOPHAGE) 500 MG tablet   Oral   Take 1,000 mg by mouth 2 (two) times daily with a meal.          . pravastatin (PRAVACHOL) 20 MG tablet   Oral   Take 20 mg by mouth at bedtime.          . Alum & Mag Hydroxide-Simeth (MAGIC MOUTHWASH) SOLN   Oral    Take 5 mLs by mouth 3 (three) times daily as needed for mouth pain.   100 mL   0    BP 183/76  Pulse 97  Temp(Src) 98.2 F (36.8 C) (Oral)  Resp 20  Ht 5\' 2"  (1.575 m)  Wt 241 lb (109.317 kg)  BMI 44.07 kg/m2  SpO2 97% Physical Exam  Nursing note and vitals reviewed. Constitutional: She is oriented to person, place, and time. She appears well-developed and well-nourished. No distress.  HENT:  Head: Normocephalic and atraumatic.  Right Ear: Tympanic membrane and ear canal normal.  Left Ear: Tympanic membrane and ear canal normal.  Mouth/Throat: Uvula is midline, oropharynx is clear and moist and mucous membranes are normal. No trismus in the jaw. Normal dentition. No uvula swelling. No oropharyngeal exudate, posterior oropharyngeal edema, posterior oropharyngeal erythema or tonsillar abscesses.  Neck: Normal range of motion. Neck supple. No JVD present. No thyromegaly present.  Cardiovascular: Normal rate, regular rhythm, normal heart sounds and intact distal pulses.   No murmur heard. Pulmonary/Chest: Effort normal and breath sounds normal. No stridor. No respiratory distress. She exhibits no tenderness.  Musculoskeletal: Normal range of motion. She exhibits no edema.  Lymphadenopathy:    She has no cervical adenopathy.  Neurological: She is alert and oriented to person, place, and time. She has normal strength. No cranial nerve deficit or sensory deficit. She exhibits normal muscle tone. Gait normal.  Reflex Scores:      Tricep reflexes are 2+ on the right side and 2+ on the left side.      Bicep reflexes are 2+ on the right side and 2+ on the left side. Skin: Skin is warm and dry.  Psychiatric: She has a normal mood and affect.    ED Course  Procedures (including critical care time) Labs Review Labs Reviewed  BASIC METABOLIC PANEL - Abnormal; Notable for the following:    Glucose, Bld 66 (*)    GFR calc non Af Amer 77 (*)    GFR calc Af Amer 89 (*)    All other  components within normal limits   Imaging Review Ct Soft Tissue Neck W Contrast  01/10/2013   CLINICAL DATA:  Dysphagia, feels like something is in throat.  EXAM: CT NECK WITH CONTRAST  TECHNIQUE: Multidetector CT imaging of the neck was performed using the standard protocol following the bolus administration of intravenous contrast.  CONTRAST:  75mL OMNIPAQUE IOHEXOL 300 MG/ML  SOLN  COMPARISON:  None available.  FINDINGS: The salivary glands including the parotid glands and submandibular glands demonstrate a normal appearance.  The visualized paranasal sinuses are clear.  No mastoid effusion.  The oral cavity is unremarkable without loculated fluid collection or mass lesion. Mild asymmetric enlargement of the right palatine tonsil is noted as compared to the left. No discrete mass lesion identified. The oropharynx and nasopharynx are otherwise unremarkable.  The hypopharynx is within normal limits. Epiglottis is normal. Vallecula is clear. Lingual tonsils are within normal limits. The hypopharynx and supraglottic larynx are normal without evidence of mass lesion. No radiopaque foreign body identified. True vocal cords are within normal limits. The subglottic airway is widely patent.  Thyroid gland is unremarkable.  No pathologically enlarged cervical lymph nodes are identified. Mildly prominent right level 2 node measuring 7 mm is noted. No enlarged lymph nodes identified within the visualized superior mediastinum.  Medial is aeration of the common carotid arteries into the retropharyngeal space is noted. Normal intravascular enhancement is seen within the neck.  Centrilobular emphysematous changes noted within the partially visualized lung apices.  Multilevel degenerative changes are noted within the visualized spine, most prominent at C5-6. No focal lytic or blastic osseous lesions  IMPRESSION: 1. No acute abnormality identified within the neck. No radiopaque foreign body within the visualized airway or  upper esophagus. 2. Centrilobular emphysema within the partially visualized lung apices.   Electronically Signed   By: Rise Mu M.D.   On: 01/10/2013 21:08    EKG Interpretation   None       MDM   1. Dysphagia     Patient is well appearing, VSS.  Tolerates po fluids.  No focal neuro deficits, dysarthria or facial droop. CT scan results discussed,   Will treat symptoms with magic mouthwash and patient agrees to close f/u with ENT.  She appears stable for discharge and agrees to care plan    Mahaley Schwering L. Trisha Mangle, PA-C 01/11/13 [phone]

## 2013-01-11 NOTE — ED Provider Notes (Signed)
  Medical screening examination/treatment/procedure(s) were performed by non-physician practitioner and as supervising physician I was immediately available for consultation/collaboration.  EKG Interpretation   None         Fin Hupp, MD 01/11/13 0037 

## 2013-01-20 ENCOUNTER — Telehealth (HOSPITAL_COMMUNITY): Payer: Self-pay | Admitting: Hematology and Oncology

## 2013-01-20 ENCOUNTER — Encounter (HOSPITAL_BASED_OUTPATIENT_CLINIC_OR_DEPARTMENT_OTHER): Payer: BC Managed Care – PPO

## 2013-01-20 ENCOUNTER — Encounter (HOSPITAL_COMMUNITY): Payer: Self-pay

## 2013-01-20 ENCOUNTER — Encounter (HOSPITAL_COMMUNITY): Payer: BC Managed Care – PPO | Attending: Hematology and Oncology

## 2013-01-20 VITALS — BP 131/82 | HR 108 | Temp 98.3°F | Resp 18 | Wt 234.9 lb

## 2013-01-20 DIAGNOSIS — C50319 Malignant neoplasm of lower-inner quadrant of unspecified female breast: Secondary | ICD-10-CM

## 2013-01-20 DIAGNOSIS — Z170 Estrogen receptor positive status [ER+]: Secondary | ICD-10-CM

## 2013-01-20 DIAGNOSIS — M5136 Other intervertebral disc degeneration, lumbar region: Secondary | ICD-10-CM

## 2013-01-20 DIAGNOSIS — C50311 Malignant neoplasm of lower-inner quadrant of right female breast: Secondary | ICD-10-CM

## 2013-01-20 DIAGNOSIS — Z853 Personal history of malignant neoplasm of breast: Secondary | ICD-10-CM | POA: Insufficient documentation

## 2013-01-20 DIAGNOSIS — Z09 Encounter for follow-up examination after completed treatment for conditions other than malignant neoplasm: Secondary | ICD-10-CM | POA: Insufficient documentation

## 2013-01-20 DIAGNOSIS — I10 Essential (primary) hypertension: Secondary | ICD-10-CM | POA: Insufficient documentation

## 2013-01-20 DIAGNOSIS — E119 Type 2 diabetes mellitus without complications: Secondary | ICD-10-CM | POA: Insufficient documentation

## 2013-01-20 LAB — CBC WITH DIFFERENTIAL/PLATELET
BASOS ABS: 0 10*3/uL (ref 0.0–0.1)
BASOS PCT: 0 % (ref 0–1)
Eosinophils Absolute: 0.3 10*3/uL (ref 0.0–0.7)
Eosinophils Relative: 3 % (ref 0–5)
HCT: 38.4 % (ref 36.0–46.0)
Hemoglobin: 12.3 g/dL (ref 12.0–15.0)
Lymphocytes Relative: 22 % (ref 12–46)
Lymphs Abs: 2.5 10*3/uL (ref 0.7–4.0)
MCH: 28.1 pg (ref 26.0–34.0)
MCHC: 32 g/dL (ref 30.0–36.0)
MCV: 87.9 fL (ref 78.0–100.0)
Monocytes Absolute: 0.6 10*3/uL (ref 0.1–1.0)
Monocytes Relative: 5 % (ref 3–12)
NEUTROS ABS: 7.9 10*3/uL — AB (ref 1.7–7.7)
NEUTROS PCT: 70 % (ref 43–77)
Platelets: 307 10*3/uL (ref 150–400)
RBC: 4.37 MIL/uL (ref 3.87–5.11)
RDW: 14.9 % (ref 11.5–15.5)
WBC: 11.3 10*3/uL — ABNORMAL HIGH (ref 4.0–10.5)

## 2013-01-20 LAB — COMPREHENSIVE METABOLIC PANEL
ALT: 17 U/L (ref 0–35)
AST: 15 U/L (ref 0–37)
Albumin: 3.4 g/dL — ABNORMAL LOW (ref 3.5–5.2)
Alkaline Phosphatase: 96 U/L (ref 39–117)
BUN: 12 mg/dL (ref 6–23)
CALCIUM: 9.5 mg/dL (ref 8.4–10.5)
CO2: 25 mEq/L (ref 19–32)
Chloride: 101 mEq/L (ref 96–112)
Creatinine, Ser: 0.79 mg/dL (ref 0.50–1.10)
GFR calc Af Amer: >90 mL/min (ref >90)
GFR calc non Af Amer: 87 mL/min — ABNORMAL LOW (ref >90)
Glucose, Bld: 187 mg/dL — ABNORMAL HIGH (ref 70–99)
POTASSIUM: 4.1 meq/L (ref 3.7–5.3)
SODIUM: 141 meq/L (ref 137–147)
TOTAL PROTEIN: 7.9 g/dL (ref 6.0–8.3)
Total Bilirubin: 0.2 mg/dL — ABNORMAL LOW (ref 0.3–1.2)

## 2013-01-20 LAB — CANCER ANTIGEN 27.29: CA 27.29: 23 U/mL (ref 0–39)

## 2013-01-20 LAB — CEA: CEA: <0.5 ng/mL (ref 0.0–5.0)

## 2013-01-20 MED ORDER — ANASTROZOLE 1 MG PO TABS: 1.0000 mg | ORAL_TABLET | Freq: Every day | ORAL | Status: DC

## 2013-01-20 NOTE — Progress Notes (Signed)
[place]  OFFICE PROGRESS NOTE  [name], FNP [address] [place] [id number]  DIAGNOSIS: Cancer of lower-inner quadrant of female breast, right - Plan: CBC with Differential, Comprehensive metabolic panel, CEA, Cancer antigen 27.29, CBC with Differential, Comprehensive metabolic panel, CEA, Cancer antigen 27.29  DDD (degenerative disc disease), lumbar  Chief Complaint  Patient presents with  . Breast Cancer    CURRENT THERAPY: Anastrozole 1 mg daily  INTERVAL HISTORY: Alexa Castillo 63 y.o. female returns for followup of stage I right breast cancer, ER/PR positive, status post lumpectomy and sentinel node biopsy following radiotherapy and currently taking anastrozole 1 mg daily with surgery on 07/24/2010 and anastrozole initiated on 12/10/2010.  Due to insurance difficulties, she stopped anastrozole November of 2014 in [facility] with some increasing hip pain. She noticed no change in the hip discomfort after discontinuing the drug and was started on meloxicam by her family physician. She has recently developed dysphagia and for that reason it is seeing an ENT specialist within the next week or so. Appetite has been good with no nausea, vomiting, diarrhea, constipation, hot flashes, vaginal dryness or bleeding, or other significant joint discomfort.  MEDICAL HISTORY: Past Medical History  Diagnosis Date  . Diabetes mellitus   . Hypertension   . Obesity   . Low back pain   . Arthritis     back/saw Dr. Aline Brochure  . Cancer     breast  . Breast cancer     INTERIM HISTORY: has Right breast cancer [id number] ER Positive; Osteoarthritis of hip; DDD (degenerative disc disease), lumbar; Weakness of right leg; and Left leg weakness on her problem list. Stage IB, grade 2 invasive ductal carcinoma of the right breast. Status post lumpectomy and sentinel node biopsy by Dr. Excell Seltzer on 07/24/2010. Lymph node was negative.  The tumor 7 mm in size, grade 2, very ER positive 100%, PR positive 100%, Ki-67 marker however was 50%. HER-2/neu was negative for amplification. Got postoperative radiation therapy and Arimidex, more for the opposite breast than for this cancer which has a good prognosis. Started Arimidex 12/10/2010.   ALLERGIES:  has No Known Allergies.  MEDICATIONS: has a current medication list which includes the following prescription(s): glipizide, ibuprofen, lisinopril, metformin, pravastatin, magic mouthwash, anastrozole, calcium-vitamin d, and meloxicam.  SURGICAL HISTORY:  Past Surgical History  Procedure Laterality Date  . Tubal ligation    . Biopsy breast      FAMILY HISTORY: family history includes Breast cancer in her sister; Cancer in her mother and sister; Diabetes in her mother; Liver cancer in her mother.  SOCIAL HISTORY:  reports that she quit smoking about 18 years ago. She has never used smokeless tobacco. She reports that she does not drink alcohol or use illicit drugs.  REVIEW OF SYSTEMS:  Other than that discussed above is noncontributory.  PHYSICAL EXAMINATION: ECOG PERFORMANCE STATUS: 1 - Symptomatic but completely ambulatory  Blood pressure 131/82, pulse 108, temperature 98.3 F (36.8 C), temperature source Oral, resp. rate 18, weight 234 lb 14.4 oz (106.55 kg).  GENERAL:alert, no distress and comfortable. Morbidly obese. SKIN: skin color, texture, turgor are normal, no rashes or significant lesions EYES: PERLA; Conjunctiva are pink and non-injected, sclera clear OROPHARYNX:no exudate, no erythema on lips, buccal mucosa, or tongue. NECK: supple, thyroid normal size, non-tender, without nodularity. No masses CHEST: Status post right breast lumpectomy with no masses in either breast. Increased AP diameter. LYMPH:  no palpable lymphadenopathy in the cervical, axillary or inguinal LUNGS: clear to auscultation and percussion with normal breathing effort HEART: regular rate &  rhythm and no murmurs. ABDOMEN:abdomen soft, non-tender and normal bowel sounds MUSCULOSKELETAL:no cyanosis of digits and no clubbing. Range of motion normal.  NEURO: alert & oriented x 3 with fluent speech, no focal motor/sensory deficits   LABORATORY DATA: Infusion on 01/20/2013  Component Date Value Range Status  . WBC 01/20/2013 11.3* 4.0 - 10.5 K/uL Final  . RBC 01/20/2013 4.37  3.87 - 5.11 MIL/uL Final  . Hemoglobin 01/20/2013 12.3  12.0 - 15.0 g/dL Final  . HCT 01/20/2013 38.4  36.0 - 46.0 % Final  . MCV 01/20/2013 87.9  78.0 - 100.0 fL Final  . MCH 01/20/2013 28.1  26.0 - 34.0 pg Final  . MCHC 01/20/2013 32.0  30.0 - 36.0 g/dL Final  . RDW 01/20/2013 14.9  11.5 - 15.5 % Final  . Platelets 01/20/2013 307  150 - 400 K/uL Final  . Neutrophils Relative % 01/20/2013 70  43 - 77 % Final  . Neutro Abs 01/20/2013 7.9* 1.7 - 7.7 K/uL Final  . Lymphocytes Relative 01/20/2013 22  12 - 46 % Final  . Lymphs Abs 01/20/2013 2.5  0.7 - 4.0 K/uL Final  . Monocytes Relative 01/20/2013 5  3 - 12 % Final  . Monocytes Absolute 01/20/2013 0.6  0.1 - 1.0 K/uL Final  . Eosinophils Relative 01/20/2013 3  0 - 5 % Final  . Eosinophils Absolute 01/20/2013 0.3  0.0 - 0.7 K/uL Final  . Basophils Relative 01/20/2013 0  0 - 1 % Final  . Basophils Absolute 01/20/2013 0.0  0.0 - 0.1 K/uL Final  . Sodium 01/20/2013 141  137 - 147 mEq/L Final  . Potassium 01/20/2013 4.1  3.7 - 5.3 mEq/L Final  . Chloride 01/20/2013 101  96 - 112 mEq/L Final  . CO2 01/20/2013 25  19 - 32 mEq/L Final  . Glucose, Bld 01/20/2013 187* 70 - 99 mg/dL Final  . BUN 01/20/2013 12  6 - 23 mg/dL Final  . Creatinine, Ser 01/20/2013 0.79  0.50 - 1.10 mg/dL Final  . Calcium 01/20/2013 9.5  8.4 - 10.5 mg/dL Final  . Total Protein 01/20/2013 7.9  6.0 - 8.3 g/dL Final  . Albumin 01/20/2013 3.4* 3.5 - 5.2 g/dL Final  . AST 01/20/2013 15  0 - 37 U/L Final  . ALT 01/20/2013 17  0 - 35 U/L Final  . Alkaline Phosphatase 01/20/2013 96  39 - 117  U/L Final  . Total Bilirubin 01/20/2013 0.2* 0.3 - 1.2 mg/dL Final  . GFR calc non Af Amer 01/20/2013 87* >90 mL/min Final  . GFR calc Af Amer 01/20/2013 >90  >90 mL/min Final   Comment: (NOTE)                          The eGFR has been calculated using the CKD EPI equation.                          This calculation has not been validated in all clinical situations.                          eGFR's persistently <90 mL/min signify possible Chronic Kidney  Disease.  Admission on 01/10/2013, Discharged on 01/10/2013  Component Date Value Range Status  . Sodium 01/10/2013 139  135 - 145 mEq/L Final  . Potassium 01/10/2013 4.1  3.5 - 5.1 mEq/L Final  . Chloride 01/10/2013 102  96 - 112 mEq/L Final  . CO2 01/10/2013 26  19 - 32 mEq/L Final  . Glucose, Bld 01/10/2013 66* 70 - 99 mg/dL Final  . BUN 01/10/2013 15  6 - 23 mg/dL Final  . Creatinine, Ser 01/10/2013 0.80  0.50 - 1.10 mg/dL Final  . Calcium 01/10/2013 10.5  8.4 - 10.5 mg/dL Final  . GFR calc non Af Amer 01/10/2013 77* >90 mL/min Final  . GFR calc Af Amer 01/10/2013 89* >90 mL/min Final   Comment: (NOTE)                          The eGFR has been calculated using the CKD EPI equation.                          This calculation has not been validated in all clinical situations.                          eGFR's persistently <90 mL/min signify possible Chronic Kidney                          Disease.   S2 no new pathology.  PATHOLOGY: No new pathology.  Urinalysis No results found for this basename: colorurine,  appearanceur,  labspec,  phurine,  glucoseu,  hgbur,  bilirubinur,  ketonesur,  proteinur,  urobilinogen,  nitrite,  leukocytesur    RADIOGRAPHIC STUDIES: Ct Soft Tissue Neck W Contrast  01/10/2013   CLINICAL DATA:  Dysphagia, feels like something is in throat.  EXAM: CT NECK WITH CONTRAST  TECHNIQUE: Multidetector CT imaging of the neck was performed using the standard protocol following the bolus  administration of intravenous contrast.  CONTRAST:  64mL OMNIPAQUE IOHEXOL 300 MG/ML  SOLN  COMPARISON:  None available.  FINDINGS: The salivary glands including the parotid glands and submandibular glands demonstrate a normal appearance.  The visualized paranasal sinuses are clear.  No mastoid effusion.  The oral cavity is unremarkable without loculated fluid collection or mass lesion. Mild asymmetric enlargement of the right palatine tonsil is noted as compared to the left. No discrete mass lesion identified. The oropharynx and nasopharynx are otherwise unremarkable.  The hypopharynx is within normal limits. Epiglottis is normal. Vallecula is clear. Lingual tonsils are within normal limits. The hypopharynx and supraglottic larynx are normal without evidence of mass lesion. No radiopaque foreign body identified. True vocal cords are within normal limits. The subglottic airway is widely patent.  Thyroid gland is unremarkable.  No pathologically enlarged cervical lymph nodes are identified. Mildly prominent right level 2 node measuring 7 mm is noted. No enlarged lymph nodes identified within the visualized superior mediastinum.  Medial is aeration of the common carotid arteries into the retropharyngeal space is noted. Normal intravascular enhancement is seen within the neck.  Centrilobular emphysematous changes noted within the partially visualized lung apices.  Multilevel degenerative changes are noted within the visualized spine, most prominent at C5-6. No focal lytic or blastic osseous lesions  IMPRESSION: 1. No acute abnormality identified within the neck. No radiopaque foreign body within the visualized airway or upper esophagus. 2. Centrilobular emphysema within  the partially visualized lung apices.   Electronically Signed   By: Jeannine Boga M.D.   On: 01/10/2013 21:08    ASSESSMENT:  #1.Stage IB, grade 2 invasive ductal carcinoma of the right breast. Status post lumpectomy and sentinel node biopsy  by Dr. Excell Seltzer on 07/24/2010. Lymph node was negative. The tumor 7 mm in size, grade 2, very ER positive 100%, PR positive 100%, Ki-67 marker however was 50%. HER-2/neu was negative for amplification. Got postoperative radiation therapy and Arimidex, more for the opposite breast than for this cancer which has a good prognosis. Started Arimidex 12/10/2010 and discontinued in November of 2014 do to lack of funds and also bilateral hip pain. She now has insurance coverage. #2. Diabetes mellitus, non-insulin requiring, controlled. #3. Morbid obesity. #4. Degenerative joint disease involving the hips, apparently not made worse by anastrozole. 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    PLAN:  #1. Review anastrozole 1 mg daily #2. Tumor markers. #3. Followup in 6 months with lab tests. #3.   All questions were answered. The patient knows to call the clinic with any problems, questions or concerns. We can certainly see the patient much sooner if necessary.   I spent 25 minutes counseling the patient face to face. The total time spent in the appointment was 30 minutes.    Doroteo Bradford, MD 01/20/2013 10:52 AM

## 2013-01-20 NOTE — Addendum Note (Signed)
Addended by: Farrel Gobble A on: 01/20/2013 01:15 PM   Modules accepted: Orders

## 2013-01-20 NOTE — Patient Instructions (Signed)
[facility] Discharge Instructions  RECOMMENDATIONS MADE BY THE CONSULTANT AND ANY TEST RESULTS WILL BE SENT TO YOUR REFERRING PHYSICIAN.  EXAM FINDINGS BY THE PHYSICIAN TODAY AND SIGNS OR SYMPTOMS TO REPORT TO CLINIC OR PRIMARY PHYSICIAN: Exam and findings as discussed by Dr. Barnet Glasgow.  We will restart the Arimidex.  Let us know if you start having any problems with it.  ..sReport any new lumps, bone pain, shortness of breath or other symptoms.  MEDICATIONS PRESCRIBED:  Arimidex - take as directed  INSTRUCTIONS/FOLLOW-UP: Follow-up in 6 months with blood work and MD visit.  Thank you for choosing [facility] to provide your oncology and hematology care.  To afford each patient quality time with our providers, please arrive at least 15 minutes before your scheduled appointment time.  With your help, our goal is to use those 15 minutes to complete the necessary work-up to ensure our physicians have the information they need to help with your evaluation and healthcare recommendations.    Effective January 1st, 2014, we ask that you re-schedule your appointment with our physicians should you arrive 10 or more minutes late for your appointment.  We strive to give you quality time with our providers, and arriving late affects you and other patients whose appointments are after yours.    Again, thank you for choosing [facility].  Our hope is that these requests will decrease the amount of time that you wait before being seen by our physicians.       _____________________________________________________________  Should you have questions after your visit to [facility], please contact our office at [phone] between the hours of 8:30 a.m. and 5:00 p.m.  Voicemails left after 4:30 p.m. will not be returned until the following business day.  For prescription refill requests, have your pharmacy contact our office with your prescription  refill request.

## 2013-01-20 NOTE — Progress Notes (Signed)
Labs drawn today for cbc/diff,cmp,cea,[id number] 

## 2013-01-20 NOTE — Telephone Encounter (Signed)
error 

## 2013-02-01 ENCOUNTER — Encounter (HOSPITAL_COMMUNITY): Payer: Self-pay | Admitting: Emergency Medicine

## 2013-02-01 ENCOUNTER — Emergency Department (HOSPITAL_COMMUNITY)
Admission: EM | Admit: 2013-02-01 | Discharge: 2013-02-01 | Disposition: A | Discharge status: Home / Self Care | Payer: BC Managed Care – PPO | Attending: Emergency Medicine | Admitting: Emergency Medicine

## 2013-02-01 DIAGNOSIS — Z87891 Personal history of nicotine dependence: Secondary | ICD-10-CM | POA: Insufficient documentation

## 2013-02-01 DIAGNOSIS — E669 Obesity, unspecified: Secondary | ICD-10-CM | POA: Insufficient documentation

## 2013-02-01 DIAGNOSIS — R1310 Dysphagia, unspecified: Secondary | ICD-10-CM | POA: Insufficient documentation

## 2013-02-01 DIAGNOSIS — Z853 Personal history of malignant neoplasm of breast: Secondary | ICD-10-CM | POA: Insufficient documentation

## 2013-02-01 DIAGNOSIS — Z791 Long term (current) use of non-steroidal anti-inflammatories (NSAID): Secondary | ICD-10-CM | POA: Insufficient documentation

## 2013-02-01 DIAGNOSIS — M129 Arthropathy, unspecified: Secondary | ICD-10-CM | POA: Insufficient documentation

## 2013-02-01 DIAGNOSIS — I10 Essential (primary) hypertension: Secondary | ICD-10-CM | POA: Insufficient documentation

## 2013-02-01 DIAGNOSIS — E119 Type 2 diabetes mellitus without complications: Secondary | ICD-10-CM | POA: Insufficient documentation

## 2013-02-01 DIAGNOSIS — Z79899 Other long term (current) drug therapy: Secondary | ICD-10-CM | POA: Insufficient documentation

## 2013-02-01 DIAGNOSIS — K219 Gastro-esophageal reflux disease without esophagitis: Secondary | ICD-10-CM | POA: Insufficient documentation

## 2013-02-01 MED ORDER — PANTOPRAZOLE SODIUM 40 MG PO TBEC
40.0000 mg | DELAYED_RELEASE_TABLET | Freq: Once | ORAL | Status: AC
  Administered 2013-02-01: 40 mg via ORAL
  Filled 2013-02-01: qty 1

## 2013-02-01 NOTE — ED Provider Notes (Signed)
CSN: [id number]     Arrival date & time 02/01/13  2033 History  This chart was scribed for Alexa Fines, MD by Eugenia Mcalpine, ED Scribe. This patient was seen in room APA08/APA08 and the patient's care was started at 11:06 PM.    Chief Complaint  Patient presents with  . Sore Throat   (Consider location/radiation/quality/duration/timing/severity/associated sxs/prior Treatment) HPI HPI Comments: Alexa Castillo is a 63 y.o. female who presents to the Emergency Department complaining of a problem with her throat for a few weeks. She states that she was seen by an ENT doctor today and told to come to the ER. Pt states that she feels like something is caught in her throat when she swallows. She states that she feels like her throat is closing up.  Pt denies nausea.  On Thursday  (the day after tomorrow) this week she is to have her esophagus stretched. Pt was seen on the 29th of this month for similar symptoms with unremarkable x-rays. She was diagnosed with GERD and was given prescription medication (omeprazole) for the reflux.   Past Medical History  Diagnosis Date  . Diabetes mellitus   . Hypertension   . Obesity   . Low back pain   . Arthritis     back/saw Dr. Aline Brochure  . Cancer     breast  . Breast cancer    Past Surgical History  Procedure Laterality Date  . Tubal ligation    . Biopsy breast     Family History  Problem Relation Age of Onset  . Liver cancer Mother   . Cancer Mother   . Diabetes Mother   . Breast cancer Sister   . Cancer Sister    History  Substance Use Topics  . Smoking status: Former Smoker    Quit date: 12/18/1994  . Smokeless tobacco: Never Used  . Alcohol Use: No   OB History   Grav Para Term Preterm Abortions TAB SAB Ect Mult Living                 Review of Systems 10 Systems reviewed and are negative for acute change except as noted in the HPI.  Allergies  Review of patient's allergies indicates no known allergies.  Home Medications    Current Outpatient Rx  Name  Route  Sig  Dispense  Refill  . Alum & Mag Hydroxide-Simeth (MAGIC MOUTHWASH) SOLN   Oral   Take 5 mLs by mouth 3 (three) times daily as needed for mouth pain.   100 mL   0   . anastrozole (ARIMIDEX) 1 MG tablet   Oral   Take 1 tablet (1 mg total) by mouth daily.   90 tablet   3   . calcium-vitamin D (OSCAL WITH D) 500-200 MG-UNIT per tablet   Oral   Take 1 tablet by mouth 2 (two) times daily.         Marland Kitchen glipiZIDE (GLUCOTROL) 10 MG tablet   Oral   Take 10 mg by mouth 2 (two) times daily before a meal.          . ibuprofen (ADVIL,MOTRIN) 200 MG tablet   Oral   Take 400 mg by mouth every 6 (six) hours as needed.          . meloxicam (MOBIC) 7.5 MG tablet   Oral   Take 7.5 mg by mouth 2 (two) times daily.         . metFORMIN (GLUCOPHAGE) 500 MG tablet  Oral   Take 1,000 mg by mouth 2 (two) times daily with a meal.          . pravastatin (PRAVACHOL) 20 MG tablet   Oral   Take 20 mg by mouth at bedtime.           BP 139/77  Pulse 93  Temp(Src) 97.9 F (36.6 C) (Oral)  Resp 18  Ht 5\' 2"  (1.575 m)  Wt 231 lb (104.781 kg)  BMI 42.24 kg/m2  SpO2 96%  Physical Exam General: Well-developed, well-nourished female in no acute distress; appearance consistent with age of record HENT: normocephalic; atraumatic; mild uvular and soft palate edema Eyes: pupils equal, round and reactive to light; extraocular muscles intact Neck: supple; no stridor; no dysphonia  Heart: regular rate and rhythm; no murmurs, rubs or gallops Lungs: clear to auscultation bilaterally Abdomen: soft; nondistended; nontender; no masses or hepatosplenomegaly; bowel sounds present Extremities: No deformity; full range of motion; pulses normal Neurologic: Awake, alert and oriented; motor function intact in all extremities and symmetric; no facial droop Skin: Warm and dry Psychiatric: Normal mood and affect  ED Course  Procedures (including critical care  time)  DIAGNOSTIC STUDIES: Oxygen Saturation is 96% on RA, adequate by my interpretation.    COORDINATION OF CARE: 10:59 PM- Pt advised of plan for treatment including starting her prescription medication given by the ENT doctor and should follow up with the ENT doctor for a scope of her throat. Advised pt to stop taking lisinopril for concern of angioedema and to follow up with ENT doctor during her appointment the day after tomorrow and pt agrees.    MDM   1. Dysphagia    I personally performed the services described in this documentation, which was scribed in my presence.  The recorded information has been reviewed and is accurate.    Alexa Fines, MD 02/01/13 2308

## 2013-02-01 NOTE — ED Notes (Signed)
Persistent dysphagia for one month, pain increased today 5/10 on left side of throat, feels swollen

## 2013-02-01 NOTE — Discharge Instructions (Signed)
Dysphagia  Swallowing problems (dysphagia) occur when solids and liquids seem to stick in your throat on the way down to your stomach, or the food takes longer to get to the stomach. Other symptoms include regurgitating food, noises coming from the throat, chest discomfort with swallowing, and a feeling of fullness or the feeling of something being stuck in your throat when swallowing. When blockage in your throat is complete it may be associated with drooling.  CAUSES   Problems with swallowing may occur because of problems with the muscles. The food cannot be propelled in the usual manner into your stomach. You may have ulcers, scar tissue, or inflammation in the tube down which food travels from your mouth to your stomach (esophagus), which blocks food from passing normally into the stomach. Causes of inflammation include:  · Acid reflux from your stomach into your esophagus.  · Infection.  · Radiation treatment for cancer.  · Medicines taken without enough fluids to wash them down into your stomach.  You may have nerve problems that prevent signals from being sent to the muscles of your esophagus to contract and move your food down to your stomach. Globus pharyngeus is a relatively common problem in which there is a sense of an obstruction or difficulty in swallowing, without any physical abnormalities of the swallowing passages being found. This problem usually improves over time with reassurance and testing to rule out other causes.  DIAGNOSIS  Dysphagia can be diagnosed and its cause can be determined by tests in which you swallow a white substance that helps illuminate the inside of your throat (contrast medium) while X-rays are taken. Sometimes a flexible telescope that is inserted down your throat (endoscopy) to look at your esophagus and stomach is used.  TREATMENT   · If the dysphagia is caused by acid reflux or infection, medicines may be used.  · If the dysphagia is caused by problems with your  swallowing muscles, swallowing therapy may be used to help you strengthen your swallowing muscles.  · If the dysphagia is caused by a blockage or mass, procedures to remove the blockage may be done.  HOME CARE INSTRUCTIONS  · Try to eat soft food that is easier to swallow and check your weight on a daily basis to be sure that it is not decreasing.  · Be sure to drink liquids when sitting upright (not lying down).  SEEK MEDICAL CARE IF:  · You are losing weight because you are unable to swallow.  · You are coughing when you drink liquids (aspiration).  · You are coughing up partially digested food.  SEEK IMMEDIATE MEDICAL CARE IF:  · You are unable to swallow your own saliva .  · You are having shortness of breath or a fever, or both.  · You have a hoarse voice along with difficulty swallowing.  MAKE SURE YOU:  · Understand these instructions.  · Will watch your condition.  · Will get help right away if you are not doing well or get worse.  Document Released: 12/28/1999 Document Revised: 09/01/2012 Document Reviewed: 06/18/2012  [employer]® Patient Information ©2014 [employer].

## 2013-02-01 NOTE — ED Notes (Signed)
Pt states the protonix is stuck in her throat I gave her 2 glasses of water and a pack of crackers to get it down.

## 2013-02-16 ENCOUNTER — Other Ambulatory Visit (HOSPITAL_COMMUNITY): Payer: BC Managed Care – PPO

## 2013-02-16 ENCOUNTER — Ambulatory Visit (HOSPITAL_COMMUNITY): Payer: BC Managed Care – PPO

## 2013-04-13 DIAGNOSIS — G4730 Sleep apnea, unspecified: Secondary | ICD-10-CM

## 2013-04-13 HISTORY — DX: Sleep apnea, unspecified: G47.30

## 2013-04-18 ENCOUNTER — Other Ambulatory Visit (HOSPITAL_COMMUNITY): Payer: Self-pay | Admitting: Family Medicine

## 2013-04-18 DIAGNOSIS — C50919 Malignant neoplasm of unspecified site of unspecified female breast: Secondary | ICD-10-CM

## 2013-05-01 ENCOUNTER — Encounter (HOSPITAL_COMMUNITY): Payer: Self-pay | Admitting: Emergency Medicine

## 2013-05-01 ENCOUNTER — Emergency Department (HOSPITAL_COMMUNITY)
Admission: EM | Admit: 2013-05-01 | Discharge: 2013-05-01 | Disposition: A | Discharge status: Home / Self Care | Payer: BC Managed Care – PPO | Attending: Emergency Medicine | Admitting: Emergency Medicine

## 2013-05-01 DIAGNOSIS — Z791 Long term (current) use of non-steroidal anti-inflammatories (NSAID): Secondary | ICD-10-CM | POA: Insufficient documentation

## 2013-05-01 DIAGNOSIS — Z87891 Personal history of nicotine dependence: Secondary | ICD-10-CM | POA: Insufficient documentation

## 2013-05-01 DIAGNOSIS — E669 Obesity, unspecified: Secondary | ICD-10-CM | POA: Insufficient documentation

## 2013-05-01 DIAGNOSIS — M129 Arthropathy, unspecified: Secondary | ICD-10-CM | POA: Insufficient documentation

## 2013-05-01 DIAGNOSIS — L301 Dyshidrosis [pompholyx]: Secondary | ICD-10-CM | POA: Insufficient documentation

## 2013-05-01 DIAGNOSIS — I10 Essential (primary) hypertension: Secondary | ICD-10-CM | POA: Insufficient documentation

## 2013-05-01 DIAGNOSIS — Z79899 Other long term (current) drug therapy: Secondary | ICD-10-CM | POA: Insufficient documentation

## 2013-05-01 DIAGNOSIS — Z853 Personal history of malignant neoplasm of breast: Secondary | ICD-10-CM | POA: Insufficient documentation

## 2013-05-01 DIAGNOSIS — E119 Type 2 diabetes mellitus without complications: Secondary | ICD-10-CM | POA: Insufficient documentation

## 2013-05-01 LAB — CBG MONITORING, ED: Glucose-Capillary: 98 mg/dL (ref 70–99)

## 2013-05-01 MED ORDER — HYDROXYZINE HCL 25 MG PO TABS: 25.0000 mg | ORAL_TABLET | Freq: Four times a day (QID) | ORAL | Status: DC

## 2013-05-01 MED ORDER — BETAMETHASONE DIPROPIONATE 0.05 % EX CREA: TOPICAL_CREAM | CUTANEOUS | Status: DC

## 2013-05-01 MED ORDER — HYDROXYZINE HCL 25 MG PO TABS
ORAL_TABLET | ORAL | Status: AC
  Filled 2013-05-01: qty 1

## 2013-05-01 MED ORDER — HYDROXYZINE HCL 25 MG PO TABS
25.0000 mg | ORAL_TABLET | Freq: Once | ORAL | Status: AC
  Administered 2013-05-01: 25 mg via ORAL

## 2013-05-01 NOTE — ED Provider Notes (Signed)
CSN: [id number]     Arrival date & time 05/01/13  1652 History   First MD Initiated Contact with Patient 05/01/13 1702     This chart was scribed for non-physician practitioner, Kem Parkinson PA-C working with Babette Relic, MD by Forrestine Him, ED Scribe. This patient was seen in room APFT21/APFT21 and the patient's care was started at 5:14 PM.   Chief Complaint  Patient presents with  . Rash   The history is provided by the patient. No language interpreter was used.    HPI Comments: Alexa Castillo is a 63 y.o. female with a PMHx of DM and HTN who presents to the Emergency Department complaining of a pruritic rash to the hands bilaterally x 1 day that is progressively worsening. Pt has also noted mild swelling to effected areas. She states she recently used a different Dawn dish detergent 2 days ago which she attributes with her current symptoms. She states the itching she is currently experiencing is typically worsened at night time. She has tried using a topical OTC Eczema cream with mild temporary improvement. She denies any fever, chills, difficulty swallowing, nausea, or vomiting. Pt is currently on Meformin, Pravastatin, and Glipizide for her diabetes. States she typically checks her sugars about every other day. No complications at this time. Denies any current insulin use. Pt has hx of eczema. No other concerns this visit.   Past Medical History  Diagnosis Date  . Diabetes mellitus   . Hypertension   . Obesity   . Low back pain   . Arthritis     back/saw Dr. Aline Brochure  . Cancer     breast  . Breast cancer    Past Surgical History  Procedure Laterality Date  . Tubal ligation    . Biopsy breast     Family History  Problem Relation Age of Onset  . Liver cancer Mother   . Cancer Mother   . Diabetes Mother   . Breast cancer Sister   . Cancer Sister    History  Substance Use Topics  . Smoking status: Former Smoker -- 1.50 packs/day for 17 years    Types: Cigarettes     Quit date: 12/18/1994  . Smokeless tobacco: Never Used  . Alcohol Use: No   OB History   Grav Para Term Preterm Abortions TAB SAB Ect Mult Living   3 2 2  1  1   2      Review of Systems  Constitutional: Negative for fever and chills.  HENT: Negative for congestion and trouble swallowing.   Eyes: Negative for redness.  Respiratory: Negative for cough.   Gastrointestinal: Negative for nausea and vomiting.  Skin: Positive for rash.  Psychiatric/Behavioral: Negative for confusion.      Allergies  Review of patient's allergies indicates no known allergies.  Home Medications   Prior to Admission medications   Medication Sig Start Date End Date Taking? Authorizing Provider  Alum & Mag Hydroxide-Simeth (MAGIC MOUTHWASH) SOLN Take 5 mLs by mouth 3 (three) times daily as needed for mouth pain. 01/10/13   Bubba Vanbenschoten L. Saagar Tortorella, PA-C  anastrozole (ARIMIDEX) 1 MG tablet Take 1 tablet (1 mg total) by mouth daily. 01/20/13   Farrel Gobble, MD  calcium-vitamin D (OSCAL WITH D) 500-200 MG-UNIT per tablet Take 1 tablet by mouth 2 (two) times daily.    Historical Provider, MD  glipiZIDE (GLUCOTROL) 10 MG tablet Take 10 mg by mouth 2 (two) times daily before a meal.  06/18/10  Historical Provider, MD  ibuprofen (ADVIL,MOTRIN) 200 MG tablet Take 400 mg by mouth every 6 (six) hours as needed.     Historical Provider, MD  meloxicam (MOBIC) 7.5 MG tablet Take 7.5 mg by mouth 2 (two) times daily.    Historical Provider, MD  metFORMIN (GLUCOPHAGE) 500 MG tablet Take 1,000 mg by mouth 2 (two) times daily with a meal.  06/18/10   Historical Provider, MD  pravastatin (PRAVACHOL) 20 MG tablet Take 20 mg by mouth at bedtime.  06/18/10   Historical Provider, MD   Triage Vitals: BP 144/76  Pulse 93  Temp(Src) 98.3 F (36.8 C) (Oral)  Resp 20  Ht 5\' 2"  (1.575 m)  Wt 214 lb (97.07 kg)  BMI 39.13 kg/m2  SpO2 95%   Physical Exam  Nursing note and vitals reviewed. Constitutional: She is oriented to person, place,  and time. She appears well-developed and well-nourished.  HENT:  Head: Normocephalic and atraumatic.  Mouth/Throat: Oropharynx is clear and moist. No oropharyngeal exudate.  Eyes: EOM are normal.  Neck: Normal range of motion.  Cardiovascular: Normal rate, regular rhythm, normal heart sounds and intact distal pulses.  Exam reveals no gallop and no friction rub.   No murmur heard. Radial pulses brisk  Pulmonary/Chest: Effort normal. No respiratory distress.  Musculoskeletal: Normal range of motion. She exhibits edema and tenderness.  Pt has nml finger opposition  Neurological: She is alert and oriented to person, place, and time.  Distal sensation intact  Skin: Skin is warm and dry. No erythema. No pallor.  Discrete flesh colored pinpoint papules to the bilateral hands and web spaces of the fingers Mild serous fluid is present Mild edema of the fingers  Psychiatric: She has a normal mood and affect. Her behavior is normal.    ED Course  Procedures (including critical care time)  DIAGNOSTIC STUDIES: Oxygen Saturation is 95% on RA, Adequate by my interpretation.    COORDINATION OF CARE: 5:28 PM- Will order CBG. Will prescribe Atarax 25 mg and Diprolene 0.05% cream at discharge. Advised pt to follow with PCP if symptoms worsen or do not improve. Discussed treatment plan with pt at bedside and pt agreed to plan.     Labs Review Labs Reviewed - No data to display  Imaging Review No results found.   EKG Interpretation None      MDM   Final diagnoses:  Dyshidrotic eczema    Pt has hx of eczema and rash to her hands appears c/w dyshidrotic eczema.  No concerning sx's for infectious process.  NV intact and pt has full ROM of the fingers.    I personally performed the services described in this documentation, which was scribed in my presence. The recorded information has been reviewed and is accurate.    Tametra Ahart L. Malayia Spizzirri, PA-C 05/04/13 1200

## 2013-05-01 NOTE — ED Notes (Signed)
Pt unable to get prescription filled.  Per PA Tammy  Give  A dose of vistaril 25 mg here .

## 2013-05-01 NOTE — ED Notes (Signed)
Patient c/o rash to hands bilaterally with swelling. Per patient itching and burning sensation. Swelling noted.

## 2013-05-01 NOTE — Discharge Instructions (Signed)
Eczema Eczema, also called atopic dermatitis, is a skin disorder that causes inflammation of the skin. It causes a red rash and dry, scaly skin. The skin becomes very itchy. Eczema is generally worse during the cooler winter months and often improves with the warmth of summer. Eczema usually starts showing signs in infancy. Some children outgrow eczema, but it may last through adulthood.  CAUSES  The exact cause of eczema is not known, but it appears to run in families. People with eczema often have a family history of eczema, allergies, asthma, or hay fever. Eczema is not contagious. Flare-ups of the condition may be caused by:   Contact with something you are sensitive or allergic to.   Stress. SIGNS AND SYMPTOMS  Dry, scaly skin.   Red, itchy rash.   Itchiness. This may occur before the skin rash and may be very intense.  DIAGNOSIS  The diagnosis of eczema is usually made based on symptoms and medical history. TREATMENT  Eczema cannot be cured, but symptoms usually can be controlled with treatment and other strategies. A treatment plan might include:  Controlling the itching and scratching.   Use over-the-counter antihistamines as directed for itching. This is especially useful at night when the itching tends to be worse.   Use over-the-counter steroid creams as directed for itching.   Avoid scratching. Scratching makes the rash and itching worse. It may also result in a skin infection (impetigo) due to a break in the skin caused by scratching.   Keeping the skin well moisturized with creams every day. This will seal in moisture and help prevent dryness. Lotions that contain alcohol and water should be avoided because they can dry the skin.   Limiting exposure to things that you are sensitive or allergic to (allergens).   Recognizing situations that cause stress.   Developing a plan to manage stress.  HOME CARE INSTRUCTIONS   Only take over-the-counter or  prescription medicines as directed by your health care provider.   Do not use anything on the skin without checking with your health care provider.   Keep baths or showers short (5 minutes) in warm (not hot) water. Use mild cleansers for bathing. These should be unscented. You may add nonperfumed bath oil to the bath water. It is best to avoid soap and bubble bath.   Immediately after a bath or shower, when the skin is still damp, apply a moisturizing ointment to the entire body. This ointment should be a petroleum ointment. This will seal in moisture and help prevent dryness. The thicker the ointment, the better. These should be unscented.   Keep fingernails cut short. Children with eczema may need to wear soft gloves or mittens at night after applying an ointment.   Dress in clothes made of cotton or cotton blends. Dress lightly, because heat increases itching.   A child with eczema should stay away from anyone with fever blisters or cold sores. The virus that causes fever blisters (herpes simplex) can cause a serious skin infection in children with eczema. SEEK MEDICAL CARE IF:   Your itching interferes with sleep.   Your rash gets worse or is not better within 1 week after starting treatment.   You see pus or soft yellow scabs in the rash area.   You have a fever.   You have a rash flare-up after contact with someone who has fever blisters.  Document Released: 12/28/1999 Document Revised: 10/20/2012 Document Reviewed: 08/02/2012 [facility] Patient Information 2014 [place].

## 2013-05-02 NOTE — ED Provider Notes (Signed)
Medical screening examination/treatment/procedure(s) were performed by non-physician practitioner and as supervising physician I was immediately available for consultation/collaboration.   EKG Interpretation None       Merlyn Conley M Afsa Meany, MD 05/02/13 2207 

## 2013-05-05 NOTE — ED Provider Notes (Signed)
Medical screening examination/treatment/procedure(s) were performed by non-physician practitioner and as supervising physician I was immediately available for consultation/collaboration.   EKG Interpretation None       Babette Relic, MD 05/05/13 [phone]

## 2013-05-24 ENCOUNTER — Ambulatory Visit (HOSPITAL_COMMUNITY)
Admission: RE | Admit: 2013-05-24 | Discharge: 2013-05-24 | Disposition: A | Discharge status: Home / Self Care | Payer: BC Managed Care – PPO | Source: Ambulatory Visit | Attending: Family Medicine | Admitting: Family Medicine

## 2013-05-24 DIAGNOSIS — Z853 Personal history of malignant neoplasm of breast: Secondary | ICD-10-CM | POA: Insufficient documentation

## 2013-05-24 DIAGNOSIS — Z1231 Encounter for screening mammogram for malignant neoplasm of breast: Secondary | ICD-10-CM | POA: Insufficient documentation

## 2013-05-24 DIAGNOSIS — C50919 Malignant neoplasm of unspecified site of unspecified female breast: Secondary | ICD-10-CM

## 2013-05-25 ENCOUNTER — Encounter (HOSPITAL_COMMUNITY): Payer: BC Managed Care – PPO

## 2013-07-20 ENCOUNTER — Encounter (HOSPITAL_BASED_OUTPATIENT_CLINIC_OR_DEPARTMENT_OTHER): Payer: BC Managed Care – PPO

## 2013-07-20 ENCOUNTER — Encounter (HOSPITAL_COMMUNITY): Payer: BC Managed Care – PPO | Attending: Hematology and Oncology

## 2013-07-20 ENCOUNTER — Encounter (HOSPITAL_COMMUNITY): Payer: Self-pay

## 2013-07-20 VITALS — BP 129/75 | HR 94 | Temp 98.4°F | Resp 16 | Wt 218.6 lb

## 2013-07-20 DIAGNOSIS — Z9989 Dependence on other enabling machines and devices: Secondary | ICD-10-CM | POA: Insufficient documentation

## 2013-07-20 DIAGNOSIS — C50319 Malignant neoplasm of lower-inner quadrant of unspecified female breast: Secondary | ICD-10-CM

## 2013-07-20 DIAGNOSIS — M5137 Other intervertebral disc degeneration, lumbosacral region: Secondary | ICD-10-CM

## 2013-07-20 DIAGNOSIS — M5136 Other intervertebral disc degeneration, lumbar region: Secondary | ICD-10-CM

## 2013-07-20 DIAGNOSIS — C50311 Malignant neoplasm of lower-inner quadrant of right female breast: Secondary | ICD-10-CM

## 2013-07-20 DIAGNOSIS — G4733 Obstructive sleep apnea (adult) (pediatric): Secondary | ICD-10-CM

## 2013-07-20 DIAGNOSIS — E669 Obesity, unspecified: Secondary | ICD-10-CM

## 2013-07-20 DIAGNOSIS — Z803 Family history of malignant neoplasm of breast: Secondary | ICD-10-CM | POA: Insufficient documentation

## 2013-07-20 DIAGNOSIS — M51379 Other intervertebral disc degeneration, lumbosacral region without mention of lumbar back pain or lower extremity pain: Secondary | ICD-10-CM | POA: Insufficient documentation

## 2013-07-20 DIAGNOSIS — E119 Type 2 diabetes mellitus without complications: Secondary | ICD-10-CM | POA: Insufficient documentation

## 2013-07-20 DIAGNOSIS — Z170 Estrogen receptor positive status [ER+]: Secondary | ICD-10-CM | POA: Insufficient documentation

## 2013-07-20 DIAGNOSIS — Z923 Personal history of irradiation: Secondary | ICD-10-CM | POA: Insufficient documentation

## 2013-07-20 DIAGNOSIS — Z79899 Other long term (current) drug therapy: Secondary | ICD-10-CM | POA: Insufficient documentation

## 2013-07-20 DIAGNOSIS — Z87891 Personal history of nicotine dependence: Secondary | ICD-10-CM | POA: Insufficient documentation

## 2013-07-20 DIAGNOSIS — J438 Other emphysema: Secondary | ICD-10-CM | POA: Insufficient documentation

## 2013-07-20 LAB — CBC WITH DIFFERENTIAL/PLATELET
BASOS PCT: 0 % (ref 0–1)
Basophils Absolute: 0 10*3/uL (ref 0.0–0.1)
EOS ABS: 0.3 10*3/uL (ref 0.0–0.7)
Eosinophils Relative: 3 % (ref 0–5)
HCT: 39.5 % (ref 36.0–46.0)
HEMOGLOBIN: 12.8 g/dL (ref 12.0–15.0)
Lymphocytes Relative: 22 % (ref 12–46)
Lymphs Abs: 2.3 10*3/uL (ref 0.7–4.0)
MCH: 27.8 pg (ref 26.0–34.0)
MCHC: 32.4 g/dL (ref 30.0–36.0)
MCV: 85.7 fL (ref 78.0–100.0)
MONOS PCT: 6 % (ref 3–12)
Monocytes Absolute: 0.6 10*3/uL (ref 0.1–1.0)
Neutro Abs: 7.3 10*3/uL (ref 1.7–7.7)
Neutrophils Relative %: 69 % (ref 43–77)
Platelets: 317 10*3/uL (ref 150–400)
RBC: 4.61 MIL/uL (ref 3.87–5.11)
RDW: 15.3 % (ref 11.5–15.5)
WBC: 10.6 10*3/uL — ABNORMAL HIGH (ref 4.0–10.5)

## 2013-07-20 LAB — COMPREHENSIVE METABOLIC PANEL
ALBUMIN: 3.4 g/dL — AB (ref 3.5–5.2)
ALK PHOS: 110 U/L (ref 39–117)
ALT: 16 U/L (ref 0–35)
AST: 19 U/L (ref 0–37)
Anion gap: 12 (ref 5–15)
BUN: 17 mg/dL (ref 6–23)
CALCIUM: 9.6 mg/dL (ref 8.4–10.5)
CO2: 27 mEq/L (ref 19–32)
Chloride: 102 mEq/L (ref 96–112)
Creatinine, Ser: 0.83 mg/dL (ref 0.50–1.10)
GFR calc Af Amer: 85 mL/min — ABNORMAL LOW (ref >90)
GFR calc non Af Amer: 73 mL/min — ABNORMAL LOW (ref >90)
Glucose, Bld: 141 mg/dL — ABNORMAL HIGH (ref 70–99)
POTASSIUM: 4.7 meq/L (ref 3.7–5.3)
Sodium: 141 mEq/L (ref 137–147)
TOTAL PROTEIN: 7.8 g/dL (ref 6.0–8.3)
Total Bilirubin: 0.3 mg/dL (ref 0.3–1.2)

## 2013-07-20 LAB — CEA: CEA: <0.5 ng/mL (ref 0.0–5.0)

## 2013-07-20 LAB — CANCER ANTIGEN 27.29: CA 27.29: 27 U/mL (ref 0–39)

## 2013-07-20 NOTE — Progress Notes (Signed)
LABS DRAWN FOR CBCD,CMP,CEA,[id number].

## 2013-07-20 NOTE — Patient Instructions (Signed)
[facility] Discharge Instructions  RECOMMENDATIONS MADE BY THE CONSULTANT AND ANY TEST RESULTS WILL BE SENT TO YOUR REFERRING PHYSICIAN.  EXAM FINDINGS BY THE PHYSICIAN TODAY AND SIGNS OR SYMPTOMS TO REPORT TO CLINIC OR PRIMARY PHYSICIAN: Exam and findings as discussed by Dr. Barnet Glasgow.  You are doing well.  Report any new lumps, bone pain, shortness of breath or other symptoms.  MEDICATIONS PRESCRIBED:  Continue arimidex  INSTRUCTIONS/FOLLOW-UP: Follow-up with labs and office visit in 6 months.  Thank you for choosing [facility] to provide your oncology and hematology care.  To afford each patient quality time with our providers, please arrive at least 15 minutes before your scheduled appointment time.  With your help, our goal is to use those 15 minutes to complete the necessary work-up to ensure our physicians have the information they need to help with your evaluation and healthcare recommendations.    Effective January 1st, 2014, we ask that you re-schedule your appointment with our physicians should you arrive 10 or more minutes late for your appointment.  We strive to give you quality time with our providers, and arriving late affects you and other patients whose appointments are after yours.    Again, thank you for choosing [facility].  Our hope is that these requests will decrease the amount of time that you wait before being seen by our physicians.       _____________________________________________________________  Should you have questions after your visit to [facility], please contact our office at [phone] between the hours of 8:30 a.m. and 4:30 p.m.  Voicemails left after 4:30 p.m. will not be returned until the following business day.  For prescription refill requests, have your pharmacy contact our office with your prescription refill request.     _______________________________________________________________  We hope that we have given you very good care.  You may receive a patient satisfaction survey in the mail, please complete it and return it as soon as possible.  We value your feedback!  _______________________________________________________________  Have you asked about our STAR program?  STAR stands for Survivorship Training and Rehabilitation, and this is a nationally recognized cancer care program that focuses on survivorship and rehabilitation.  Cancer and cancer treatments may cause problems, such as, pain, making you feel tired and keeping you from doing the things that you need or want to do. Cancer rehabilitation can help. Our goal is to reduce these troubling effects and help you have the best quality of life possible.  You may receive a survey from a nurse that asks questions about your current state of health.  Based on the survey results, all eligible patients will be referred to the [facility] program for an evaluation so we can better serve you!  A frequently asked questions sheet is available upon request.

## 2013-07-20 NOTE — Progress Notes (Signed)
[facility]  OFFICE PROGRESS NOTE  [place], PA-C [address] [place] [address]  DIAGNOSIS: Cancer of lower-inner quadrant of female breast, right - Plan: CBC with Differential, Comprehensive metabolic panel, CEA, Cancer antigen 27.29  DDD (degenerative disc disease), lumbar  Other emphysema  Obstructive sleep apnea syndrome in adult  Chief Complaint  Patient presents with  . Breast Cancer  . Obstructive sleep apnea syndrome    CURRENT THERAPY: Anastrozole 1 mg daily  INTERVAL HISTORY: Alexa Castillo 63 y.o. female returns for followup of stage I right breast cancer, ER/PR positive, status post lumpectomy and sentinel node biopsy following radiotherapy and currently taking anastrozole 1 mg daily with surgery on 07/24/2010 and anastrozole initiated on 12/10/2010.  Due to insurance difficulties, she stopped anastrozole November of 2014 in association with  increasing hip pain. Drug was reinstituted on 01/20/2013 with plans to continue therapy at least through November of 2017. She was recently diagnosed with obstructive sleep apnea syndrome and was started on CPAP about 6 or 7 days ago. She has noticed a decrease in her daytime fatigue. She denies any cough, wheezing, shortness of breath on exertion, significant hot flashes, worsening joint pain, vaginal dryness or bleeding, lower extremity swelling that does not dissipate over night, skin rash, cough, wheezing, bone pain, headache, or seizures.    MEDICAL HISTORY: Past Medical History  Diagnosis Date  . Diabetes mellitus   . Hypertension   . Obesity   . Low back pain   . Arthritis     back/saw Dr. Aline Brochure  . Cancer     breast  . Breast cancer   . Sleep apnea 04/2013    INTERIM HISTORY: has Right breast cancer [id number] ER Positive; Osteoarthritis of hip; DDD (degenerative disc disease), lumbar; Weakness of right leg; Left leg weakness; and Obstructive sleep apnea  syndrome in adult on her problem list.    ALLERGIES:  has No Known Allergies.  MEDICATIONS: has a current medication list which includes the following prescription(s): anastrozole, betamethasone dipropionate, calcium-vitamin d, glipizide, hydroxyzine, ibuprofen, metformin, omeprazole, pravastatin, and magic mouthwash.  SURGICAL HISTORY:  Past Surgical History  Procedure Laterality Date  . Tubal ligation    . Biopsy breast      FAMILY HISTORY: family history includes Breast cancer in her sister; Cancer in her mother and sister; Diabetes in her mother; Liver cancer in her mother.  SOCIAL HISTORY:  reports that she quit smoking about 18 years ago. Her smoking use included Cigarettes. She has a 25.5 pack-year smoking history. She has never used smokeless tobacco. She reports that she does not drink alcohol or use illicit drugs.  REVIEW OF SYSTEMS:  Other than that discussed above is noncontributory.  PHYSICAL EXAMINATION: ECOG PERFORMANCE STATUS: 1 - Symptomatic but completely ambulatory  Blood pressure 129/75, pulse 94, temperature 98.4 F (36.9 C), temperature source Oral, resp. rate 16, weight 218 lb 9.6 oz (99.156 kg).  GENERAL:alert, no distress and comfortable SKIN: skin color, texture, turgor are normal, no rashes or significant lesions EYES: PERLA; Conjunctiva are pink and non-injected, sclera clear SINUSES: No redness or tenderness over maxillary or ethmoid sinuses OROPHARYNX:no exudate, no erythema on lips, buccal mucosa, or tongue. NECK: supple, thyroid normal size, non-tender, without nodularity. No masses CHEST: Increased AP diameter. Status post right breast lumpectomy with no masses in either breast. LYMPH:  no palpable lymphadenopathy in the cervical, axillary or inguinal LUNGS: clear to auscultation and percussion  with normal breathing effort HEART: regular rate & rhythm and no murmurs. ABDOMEN:abdomen soft, non-tender and normal bowel sounds MUSCULOSKELETAL:no  cyanosis of digits and no clubbing. Range of motion normal.  NEURO: alert & oriented x 3 with fluent speech, no focal motor/sensory deficits   LABORATORY DATA: Lab on 07/20/2013  Component Date Value Ref Range Status  . WBC 07/20/2013 10.6* 4.0 - 10.5 K/uL Final  . RBC 07/20/2013 4.61  3.87 - 5.11 MIL/uL Final  . Hemoglobin 07/20/2013 12.8  12.0 - 15.0 g/dL Final  . HCT 07/20/2013 39.5  36.0 - 46.0 % Final  . MCV 07/20/2013 85.7  78.0 - 100.0 fL Final  . MCH 07/20/2013 27.8  26.0 - 34.0 pg Final  . MCHC 07/20/2013 32.4  30.0 - 36.0 g/dL Final  . RDW 07/20/2013 15.3  11.5 - 15.5 % Final  . Platelets 07/20/2013 317  150 - 400 K/uL Final  . Neutrophils Relative % 07/20/2013 69  43 - 77 % Final  . Neutro Abs 07/20/2013 7.3  1.7 - 7.7 K/uL Final  . Lymphocytes Relative 07/20/2013 22  12 - 46 % Final  . Lymphs Abs 07/20/2013 2.3  0.7 - 4.0 K/uL Final  . Monocytes Relative 07/20/2013 6  3 - 12 % Final  . Monocytes Absolute 07/20/2013 0.6  0.1 - 1.0 K/uL Final  . Eosinophils Relative 07/20/2013 3  0 - 5 % Final  . Eosinophils Absolute 07/20/2013 0.3  0.0 - 0.7 K/uL Final  . Basophils Relative 07/20/2013 0  0 - 1 % Final  . Basophils Absolute 07/20/2013 0.0  0.0 - 0.1 K/uL Final  . Sodium 07/20/2013 141  137 - 147 mEq/L Final  . Potassium 07/20/2013 4.7  3.7 - 5.3 mEq/L Final  . Chloride 07/20/2013 102  96 - 112 mEq/L Final  . CO2 07/20/2013 27  19 - 32 mEq/L Final  . Glucose, Bld 07/20/2013 141* 70 - 99 mg/dL Final  . BUN 07/20/2013 17  6 - 23 mg/dL Final  . Creatinine, Ser 07/20/2013 0.83  0.50 - 1.10 mg/dL Final  . Calcium 07/20/2013 9.6  8.4 - 10.5 mg/dL Final  . Total Protein 07/20/2013 7.8  6.0 - 8.3 g/dL Final  . Albumin 07/20/2013 3.4* 3.5 - 5.2 g/dL Final  . AST 07/20/2013 19  0 - 37 U/L Final  . ALT 07/20/2013 16  0 - 35 U/L Final  . Alkaline Phosphatase 07/20/2013 110  39 - 117 U/L Final  . Total Bilirubin 07/20/2013 0.3  0.3 - 1.2 mg/dL Final  . GFR calc non Af Amer  07/20/2013 73* >90 mL/min Final  . GFR calc Af Amer 07/20/2013 85* >90 mL/min Final   Comment: (NOTE)                          The eGFR has been calculated using the CKD EPI equation.                          This calculation has not been validated in all clinical situations.                          eGFR's persistently <90 mL/min signify possible Chronic Kidney                          Disease.  . Anion gap 07/20/2013 12  5 -  15 Final    PATHOLOGY: No new pathology.  Urinalysis No results found for this basename: colorurine,  appearanceur,  labspec,  phurine,  glucoseu,  hgbur,  bilirubinur,  ketonesur,  proteinur,  urobilinogen,  nitrite,  leukocytesur    RADIOGRAPHIC STUDIES: MM Digital Diagnostic Bilat Status: Final result            Study Result    CLINICAL DATA: 63 year old female for annual bilateral mammograms.  History of right breast cancer and lumpectomy in 2012.  EXAM:  DIGITAL DIAGNOSTIC BILATERAL MAMMOGRAM WITH CAD  COMPARISON: 05/19/2012 and prior mammograms dating back to  04/18/2008  ACR Breast Density Category a: The breast tissue is almost entirely  fatty.  FINDINGS:  A magnification view of the lumpectomy site and routine views of  both breasts demonstrate minimal right breast scarring.  There is no evidence of suspicious mass, nonsurgical distortion or  worrisome calcifications.  Mammographic images were processed with CAD.  IMPRESSION:  No mammographic evidence of breast malignancy.  Right breast scarring.  RECOMMENDATION:  Bilateral diagnostic mammograms in 1 year.  I have discussed the findings and recommendations with the patient.  Results were also provided in writing at the conclusion of the  visit. If applicable, a reminder letter will be sent to the patient  regarding the next appointment.  BI-RADS CATEGORY 2: Benign.  Electronically Signed  By: Hassan Rowan M.D.  On: 05/24/2013      ASSESSMENT:  #1.Stage IB, grade 2 invasive ductal  carcinoma of the right breast. Status post lumpectomy and sentinel node biopsy by Dr. Excell Seltzer on 07/24/2010. Lymph node was negative. The tumor 7 mm in size, grade 2, very ER positive 100%, PR positive 100%, Ki-67 marker however was 50%. HER-2/neu was negative for amplification. Got postoperative radiation therapy and Arimidex, more for the opposite breast than for this cancer which has a good prognosis. Started Arimidex 12/10/2010 and discontinued in November of 2014 do to lack of funds and also bilateral hip pain. She now has insurance coverage.  #2. Diabetes mellitus, non-insulin requiring, controlled.  #3. Morbid obesity.  #4. Degenerative joint disease involving the hips, apparently not made worse by anastrozole.  #5. Obstructive sleep apnea syndrome, on CPAP.       PLAN:  #1. Continue anastrozole 1 mg daily. #2. Monthly self breast examination. #3. Followup in 6 months with CBC, chem profile, CA 27-29, CEA.   All questions were answered. The patient knows to call the clinic with any problems, questions or concerns. We can certainly see the patient much sooner if necessary.   I spent 25 minutes counseling the patient face to face. The total time spent in the appointment was 30 minutes.    Doroteo Bradford, MD 07/20/2013 10:19 AM  DISCLAIMER:  This note was dictated with voice recognition software.  Similar sounding words can inadvertently be transcribed inaccurately and may not be corrected upon review.

## 2013-11-14 ENCOUNTER — Encounter (HOSPITAL_COMMUNITY): Payer: Self-pay

## 2013-12-15 ENCOUNTER — Other Ambulatory Visit (HOSPITAL_COMMUNITY): Payer: Self-pay | Admitting: Hematology and Oncology

## 2013-12-15 MED ORDER — ANASTROZOLE 1 MG PO TABS: 1.0000 mg | ORAL_TABLET | Freq: Every day | ORAL | Status: DC

## 2014-01-19 NOTE — Progress Notes (Signed)
[facility], [facility] [address] [place] [address]  Right breast cancer T1bN0M0 ER Positive status post lumpectomy and sentinel node biopsy (07/24/2010)  Adjuvant Radiation Anastrozole initiated on 12/10/2010.  plans to continue therapy at least through November of 2017.  DEXA?  Mammogram 05/24/2013  CURRENT THERAPY: Arimidex  INTERVAL HISTORY: Alexa Castillo 64 y.o. female returns for follow-up of her ER+ early stage breast cancer.  Her back has been bothering her. She says she has DJD.  A physician has given her 800 mg ibuprofen and it helps. She takes it twice a day. She says her back pain is the same chronic pain she has always had. She takes oscal plus vitamin D daily.  MEDICAL HISTORY: Past Medical History  Diagnosis Date  . Diabetes mellitus   . Hypertension   . Obesity   . Low back pain   . Arthritis     back/saw Dr. Aline Brochure  . Cancer     breast  . Breast cancer   . Sleep apnea 04/2013    has Right breast cancer T1bN0M0 ER Positive; Osteoarthritis of hip; DDD (degenerative disc disease), lumbar; Weakness of right leg; Left leg weakness; and Obstructive sleep apnea syndrome in adult on her problem list.     No history exists.     has No Known Allergies.  We administered Influenza vac split quadrivalent PF.  SURGICAL HISTORY: Past Surgical History  Procedure Laterality Date  . Tubal ligation    . Biopsy breast      SOCIAL HISTORY: History   Social History  . Marital Status: Single    Spouse Name: N/A    Number of Children: N/A  . Years of Education: N/A   Occupational History  . Not on file.   Social History Main Topics  . Smoking status: Former Smoker -- 1.50 packs/day for 17 years    Types: Cigarettes    Quit date: 12/18/1994  . Smokeless tobacco: Never Used  . Alcohol Use: No  . Drug Use: No  . Sexual Activity: Not on file   Other Topics Concern  . Not on file   Social History Narrative    FAMILY HISTORY: Family  History  Problem Relation Age of Onset  . Liver cancer Mother   . Cancer Mother   . Diabetes Mother   . Breast cancer Sister   . Cancer Sister     Review of Systems  Musculoskeletal: Positive for back pain and joint pain. Negative for myalgias, falls and neck pain.  All other systems reviewed and are negative.   PHYSICAL EXAMINATION  ECOG PERFORMANCE STATUS: 1 - Symptomatic but completely ambulatory  Filed Vitals:   01/20/14 0900  BP: 153/89  Pulse: 95  Temp: 97.9 F (36.6 C)  Resp: 18    Physical Exam  Constitutional: She is oriented to person, place, and time and well-developed, well-nourished, and in no distress.  HENT:  Head: Normocephalic and atraumatic.  Nose: Nose normal.  Mouth/Throat: Oropharynx is clear and moist. No oropharyngeal exudate.  Eyes: Conjunctivae and EOM are normal. Pupils are equal, round, and reactive to light. Right eye exhibits no discharge. Left eye exhibits no discharge. No scleral icterus.  Neck: Normal range of motion. Neck supple. No tracheal deviation present. No thyromegaly present.  Cardiovascular: Normal rate, regular rhythm and normal heart sounds.  Exam reveals no gallop and no friction rub.   No murmur heard. Pulmonary/Chest: Effort normal and breath sounds normal. She has no wheezes.  She has no rales.  Bilateral breast exam with R breast changes from surgery and XRT. No palpable masses, no nipple changes. Bilateral Axillae are without palpable adenopathy  Abdominal: Soft. Bowel sounds are normal. She exhibits no distension and no mass. There is no tenderness. There is no rebound and no guarding.  Musculoskeletal: Normal range of motion. She exhibits no edema.  Lymphadenopathy:    She has no cervical adenopathy.  Neurological: She is alert and oriented to person, place, and time. She has normal reflexes. No cranial nerve deficit. Gait normal. Coordination normal.  Skin: Skin is warm and dry. No rash noted.  Psychiatric: Mood, memory,  affect and judgment normal.  Nursing note and vitals reviewed.   LABORATORY DATA:  CBC    Component Value Date/Time   WBC 9.0 01/20/2014 0908   RBC 4.49 01/20/2014 0908   HGB 12.8 01/20/2014 0908   HCT 40.0 01/20/2014 0908   PLT 278 01/20/2014 0908   MCV 89.1 01/20/2014 0908   MCH 28.5 01/20/2014 0908   MCHC 32.0 01/20/2014 0908   RDW 14.6 01/20/2014 0908   LYMPHSABS 2.4 01/20/2014 0908   MONOABS 0.6 01/20/2014 0908   EOSABS 0.2 01/20/2014 0908   BASOSABS 0.0 01/20/2014 0908   CMP     Component Value Date/Time   NA 139 01/20/2014 0908   K 4.1 01/20/2014 0908   CL 105 01/20/2014 0908   CO2 28 01/20/2014 0908   GLUCOSE 124* 01/20/2014 0908   BUN 12 01/20/2014 0908   CREATININE 0.72 01/20/2014 0908   CALCIUM 9.5 01/20/2014 0908   PROT 7.6 01/20/2014 0908   ALBUMIN 3.7 01/20/2014 0908   AST 18 01/20/2014 0908   ALT 20 01/20/2014 0908   ALKPHOS 99 01/20/2014 0908   BILITOT 0.4 01/20/2014 0908   GFRNONAA 89* 01/20/2014 0908   GFRAA >90 01/20/2014 0908      ASSESSMENT and THERAPY PLAN:    Right breast cancer T1bN0M0 ER Positive Pleasant 64 year old female with a stage I ER positive HER-2 negative carcinoma of the right breast. She is currently on Arimidex and tolerating it well. She has chronic back and joint pain, she does not feel that the Arimidex is making this worse. She needs a DEXA scan to assess her bone density. We will arrange this for her. I advised her to continue on calcium and vitamin D daily. He will be due for a mammogram again in May we will set this up for her as well. I will notify her of the results of her DEXA when it is available. We will formally see her back again in 6 months. She knows to call the clinic if she has any interim problems or concerns.      All questions were answered. The patient knows to call the clinic with any problems, questions or concerns. We can certainly see the patient much sooner if necessary.   Molli Hazard 01/28/2014

## 2014-01-20 ENCOUNTER — Encounter (HOSPITAL_BASED_OUTPATIENT_CLINIC_OR_DEPARTMENT_OTHER): Payer: BLUE CROSS/BLUE SHIELD

## 2014-01-20 ENCOUNTER — Encounter (HOSPITAL_COMMUNITY): Payer: BLUE CROSS/BLUE SHIELD | Attending: Hematology & Oncology | Admitting: Hematology & Oncology

## 2014-01-20 ENCOUNTER — Encounter (HOSPITAL_COMMUNITY): Payer: Self-pay | Admitting: Hematology & Oncology

## 2014-01-20 VITALS — BP 153/89 | HR 95 | Temp 97.9°F | Resp 18 | Wt 235.0 lb

## 2014-01-20 DIAGNOSIS — C50311 Malignant neoplasm of lower-inner quadrant of right female breast: Secondary | ICD-10-CM

## 2014-01-20 DIAGNOSIS — E669 Obesity, unspecified: Secondary | ICD-10-CM | POA: Insufficient documentation

## 2014-01-20 DIAGNOSIS — E119 Type 2 diabetes mellitus without complications: Secondary | ICD-10-CM | POA: Insufficient documentation

## 2014-01-20 DIAGNOSIS — Z08 Encounter for follow-up examination after completed treatment for malignant neoplasm: Secondary | ICD-10-CM | POA: Insufficient documentation

## 2014-01-20 DIAGNOSIS — Z23 Encounter for immunization: Secondary | ICD-10-CM

## 2014-01-20 DIAGNOSIS — Z170 Estrogen receptor positive status [ER+]: Secondary | ICD-10-CM | POA: Diagnosis not present

## 2014-01-20 DIAGNOSIS — Z853 Personal history of malignant neoplasm of breast: Secondary | ICD-10-CM | POA: Diagnosis not present

## 2014-01-20 DIAGNOSIS — I10 Essential (primary) hypertension: Secondary | ICD-10-CM | POA: Insufficient documentation

## 2014-01-20 DIAGNOSIS — Z79811 Long term (current) use of aromatase inhibitors: Secondary | ICD-10-CM

## 2014-01-20 DIAGNOSIS — Z5181 Encounter for therapeutic drug level monitoring: Secondary | ICD-10-CM

## 2014-01-20 DIAGNOSIS — Z780 Asymptomatic menopausal state: Secondary | ICD-10-CM

## 2014-01-20 LAB — CBC WITH DIFFERENTIAL/PLATELET
BASOS PCT: 0 % (ref 0–1)
Basophils Absolute: 0 10*3/uL (ref 0.0–0.1)
EOS ABS: 0.2 10*3/uL (ref 0.0–0.7)
Eosinophils Relative: 2 % (ref 0–5)
HCT: 40 % (ref 36.0–46.0)
Hemoglobin: 12.8 g/dL (ref 12.0–15.0)
LYMPHS ABS: 2.4 10*3/uL (ref 0.7–4.0)
Lymphocytes Relative: 26 % (ref 12–46)
MCH: 28.5 pg (ref 26.0–34.0)
MCHC: 32 g/dL (ref 30.0–36.0)
MCV: 89.1 fL (ref 78.0–100.0)
Monocytes Absolute: 0.6 10*3/uL (ref 0.1–1.0)
Monocytes Relative: 7 % (ref 3–12)
NEUTROS PCT: 65 % (ref 43–77)
Neutro Abs: 5.9 10*3/uL (ref 1.7–7.7)
PLATELETS: 278 10*3/uL (ref 150–400)
RBC: 4.49 MIL/uL (ref 3.87–5.11)
RDW: 14.6 % (ref 11.5–15.5)
WBC: 9 10*3/uL (ref 4.0–10.5)

## 2014-01-20 LAB — COMPREHENSIVE METABOLIC PANEL
ALBUMIN: 3.7 g/dL (ref 3.5–5.2)
ALT: 20 U/L (ref 0–35)
AST: 18 U/L (ref 0–37)
Alkaline Phosphatase: 99 U/L (ref 39–117)
Anion gap: 6 (ref 5–15)
BUN: 12 mg/dL (ref 6–23)
CO2: 28 mmol/L (ref 19–32)
CREATININE: 0.72 mg/dL (ref 0.50–1.10)
Calcium: 9.5 mg/dL (ref 8.4–10.5)
Chloride: 105 mEq/L (ref 96–112)
GFR calc Af Amer: >90 mL/min (ref >90)
GFR, EST NON AFRICAN AMERICAN: 89 mL/min — AB (ref >90)
GLUCOSE: 124 mg/dL — AB (ref 70–99)
Potassium: 4.1 mmol/L (ref 3.5–5.1)
SODIUM: 139 mmol/L (ref 135–145)
TOTAL PROTEIN: 7.6 g/dL (ref 6.0–8.3)
Total Bilirubin: 0.4 mg/dL (ref 0.3–1.2)

## 2014-01-20 LAB — CANCER ANTIGEN 27.29: CA 27.29: 26 U/mL (ref 0–39)

## 2014-01-20 MED ORDER — INFLUENZA VAC SPLIT QUAD 0.5 ML IM SUSY
0.5000 mL | PREFILLED_SYRINGE | Freq: Once | INTRAMUSCULAR | Status: AC
  Administered 2014-01-20: 0.5 mL via INTRAMUSCULAR
  Filled 2014-01-20: qty 0.5

## 2014-01-20 MED ORDER — ANASTROZOLE 1 MG PO TABS: 1.0000 mg | ORAL_TABLET | Freq: Every day | ORAL | Status: DC

## 2014-01-20 NOTE — Progress Notes (Signed)
LABS FOR CEA,[id number],CBCD,CMP

## 2014-01-20 NOTE — Patient Instructions (Signed)
[place] Discharge Instructions  RECOMMENDATIONS MADE BY THE CONSULTANT AND ANY TEST RESULTS WILL BE SENT TO YOUR REFERRING PHYSICIAN.  If you have any concerns or problems prior to f/u please call us. If your chronic back pain changes or becomes worse please let us know. Continue to take your arimidex every day. We will call you with the results of your DEXA.   Thank you for choosing [place] at [facility] to  provide your oncology and hematology care. To afford each patient quality  time with our provider, please arrive at least 15 minutes before your  scheduled appointment time.  You need to re-schedule your appointment should you arrive 10 or more  minutes late. We strive to give you quality time with our providers, and  arriving late affects you and other patients whose appointments are after  yours. Also, if you no show three or more times for appointments you may  be dismissed from the clinic at the providers discretion.  Again, thank you for choosing [facility]. Our hope is that  these requests will decrease the amount of time that you wait before being seen by our physicians. _______________________________________________________________  Should you have questions after your visit to [facility], please contact our office at [phone] between the hours of 8:30 a.m. and 5:00 p.m.  Voicemails left after 4:30 p.m. will not be returned until the following business day.  For prescription refill requests, have your pharmacy contact our office with your prescription refill request.

## 2014-01-20 NOTE — Progress Notes (Signed)
Alexa Castillo presents today for injection per the provider's orders.  Influenza  administration without incident; see MAR for injection details.  Patient tolerated procedure well and without incident.  No questions or complaints noted at this time.

## 2014-01-21 LAB — CEA: CEA: 2.1 ng/mL (ref 0.0–4.7)

## 2014-01-25 ENCOUNTER — Ambulatory Visit (HOSPITAL_COMMUNITY)
Admission: RE | Admit: 2014-01-25 | Discharge: 2014-01-25 | Disposition: A | Discharge status: Home / Self Care | Payer: BLUE CROSS/BLUE SHIELD | Source: Ambulatory Visit | Attending: Hematology & Oncology | Admitting: Hematology & Oncology

## 2014-01-25 DIAGNOSIS — Z13820 Encounter for screening for osteoporosis: Secondary | ICD-10-CM | POA: Diagnosis present

## 2014-01-25 DIAGNOSIS — Z780 Asymptomatic menopausal state: Secondary | ICD-10-CM | POA: Diagnosis not present

## 2014-01-25 DIAGNOSIS — Z853 Personal history of malignant neoplasm of breast: Secondary | ICD-10-CM | POA: Insufficient documentation

## 2014-01-25 DIAGNOSIS — Z5181 Encounter for therapeutic drug level monitoring: Secondary | ICD-10-CM

## 2014-01-25 DIAGNOSIS — C50311 Malignant neoplasm of lower-inner quadrant of right female breast: Secondary | ICD-10-CM

## 2014-01-25 DIAGNOSIS — Z79811 Long term (current) use of aromatase inhibitors: Secondary | ICD-10-CM

## 2014-01-28 NOTE — Assessment & Plan Note (Signed)
Pleasant 64 year old female with a stage I ER positive HER-2 negative carcinoma of the right breast. She is currently on Arimidex and tolerating it well. She has chronic back and joint pain, she does not feel that the Arimidex is making this worse. She needs a DEXA scan to assess her bone density. We will arrange this for her. I advised her to continue on calcium and vitamin D daily. He will be due for a mammogram again in May we will set this up for her as well. I will notify her of the results of her DEXA when it is available. We will formally see her back again in 6 months. She knows to call the clinic if she has any interim problems or concerns.

## 2014-02-07 ENCOUNTER — Telehealth (HOSPITAL_COMMUNITY): Payer: Self-pay | Admitting: Emergency Medicine

## 2014-02-07 NOTE — Telephone Encounter (Signed)
Called spoke with husband told him that bone density was normal and for her to continue on calcium and vitamin D

## 2014-02-07 NOTE — Telephone Encounter (Signed)
-----   Message from Molli Hazard, MD sent at 02/06/2014  9:23 PM EST ----- Advise bone density if normal. Continue on calcium and vitamin D, Dr.P

## 2014-02-08 ENCOUNTER — Other Ambulatory Visit (HOSPITAL_COMMUNITY): Payer: Self-pay | Admitting: Oncology

## 2014-02-08 DIAGNOSIS — C50311 Malignant neoplasm of lower-inner quadrant of right female breast: Secondary | ICD-10-CM

## 2014-02-08 MED ORDER — ANASTROZOLE 1 MG PO TABS: 1.0000 mg | ORAL_TABLET | Freq: Every day | ORAL | Status: DC

## 2014-05-24 ENCOUNTER — Telehealth (HOSPITAL_COMMUNITY): Payer: Self-pay | Admitting: *Deleted

## 2014-05-24 ENCOUNTER — Other Ambulatory Visit (HOSPITAL_COMMUNITY): Payer: Self-pay | Admitting: Oncology

## 2014-05-24 DIAGNOSIS — C50311 Malignant neoplasm of lower-inner quadrant of right female breast: Secondary | ICD-10-CM

## 2014-05-24 NOTE — Telephone Encounter (Signed)
She should have refills on her medication.  She should call her pharmacy.  KEFALAS,THOMAS 05/24/2014

## 2014-05-25 ENCOUNTER — Other Ambulatory Visit (HOSPITAL_COMMUNITY): Payer: Self-pay | Admitting: Oncology

## 2014-05-25 DIAGNOSIS — C50311 Malignant neoplasm of lower-inner quadrant of right female breast: Secondary | ICD-10-CM

## 2014-05-25 MED ORDER — ANASTROZOLE 1 MG PO TABS: 1.0000 mg | ORAL_TABLET | Freq: Every day | ORAL | Status: DC

## 2014-05-30 ENCOUNTER — Ambulatory Visit (HOSPITAL_COMMUNITY)
Admission: RE | Admit: 2014-05-30 | Discharge: 2014-05-30 | Disposition: A | Discharge status: Home / Self Care | Payer: BLUE CROSS/BLUE SHIELD | Source: Ambulatory Visit | Attending: Hematology & Oncology | Admitting: Hematology & Oncology

## 2014-05-30 DIAGNOSIS — C50311 Malignant neoplasm of lower-inner quadrant of right female breast: Secondary | ICD-10-CM | POA: Diagnosis not present

## 2014-05-30 DIAGNOSIS — Z9889 Other specified postprocedural states: Secondary | ICD-10-CM | POA: Diagnosis not present

## 2014-07-21 ENCOUNTER — Ambulatory Visit (HOSPITAL_COMMUNITY): Payer: BLUE CROSS/BLUE SHIELD | Admitting: Hematology & Oncology

## 2014-07-27 ENCOUNTER — Encounter (HOSPITAL_COMMUNITY): Payer: BLUE CROSS/BLUE SHIELD | Attending: Hematology & Oncology | Admitting: Hematology & Oncology

## 2014-07-27 ENCOUNTER — Encounter (HOSPITAL_COMMUNITY): Payer: Self-pay | Admitting: Hematology & Oncology

## 2014-07-27 VITALS — BP 153/83 | HR 88 | Temp 98.4°F | Resp 20 | Wt 237.0 lb

## 2014-07-27 DIAGNOSIS — M545 Low back pain, unspecified: Secondary | ICD-10-CM

## 2014-07-27 DIAGNOSIS — G8929 Other chronic pain: Secondary | ICD-10-CM | POA: Diagnosis not present

## 2014-07-27 DIAGNOSIS — Z79899 Other long term (current) drug therapy: Secondary | ICD-10-CM | POA: Diagnosis not present

## 2014-07-27 DIAGNOSIS — C50919 Malignant neoplasm of unspecified site of unspecified female breast: Secondary | ICD-10-CM

## 2014-07-27 NOTE — Progress Notes (Signed)
[facility], [facility] [address] [place] [address]  Right breast cancer T1bN0M0 ER Positive status post lumpectomy and sentinel node biopsy (07/24/2010)  Adjuvant Radiation Anastrozole initiated on 12/10/2010.  plans to continue therapy at least through November of 2017.  DEXA 01/25/2014 with normal bone density  Mammogram 05/30/2014  CURRENT THERAPY: Arimidex  INTERVAL HISTORY: Alexa Castillo 64 y.o. female returns for follow-up of her ER+ early stage breast cancer.  Her back has been bothering her. She says she has DJD.  She takes oscal plus vitamin D daily.   The patient is alone today and has complaints of ongoing lower back pain.  The last Doctor she's seen advised naproxen and she says that this only helped a little bit.    She takes her arimidex everyday and will be completed with therapy next summer. She denies any significant problems with her Arimidex such as severe hot flashes or joint pain. Denies blood in stool and bowel complications She is up to date on all her labs and yearly procedures.    MEDICAL HISTORY: Past Medical History  Diagnosis Date  . Diabetes mellitus   . Hypertension   . Obesity   . Low back pain   . Arthritis     back/saw Dr. Aline Brochure  . Cancer     breast  . Breast cancer   . Sleep apnea 04/2013    has Right breast cancer T1bN0M0 ER Positive; Osteoarthritis of hip; DDD (degenerative disc disease), lumbar; Weakness of right leg; Left leg weakness; and Obstructive sleep apnea syndrome in adult on her problem list.     No history exists.     has No Known Allergies.  Ms. Fillingim had no medications administered during this visit.  SURGICAL HISTORY: Past Surgical History  Procedure Laterality Date  . Tubal ligation    . Biopsy breast      SOCIAL HISTORY: History   Social History  . Marital Status: Single    Spouse Name: N/A  . Number of Children: N/A  . Years of Education: N/A   Occupational History  . Not on  file.   Social History Main Topics  . Smoking status: Former Smoker -- 1.50 packs/day for 17 years    Types: Cigarettes    Quit date: 12/18/1994  . Smokeless tobacco: Never Used  . Alcohol Use: No  . Drug Use: No  . Sexual Activity: Not on file   Other Topics Concern  . Not on file   Social History Narrative    FAMILY HISTORY: Family History  Problem Relation Age of Onset  . Liver cancer Mother   . Cancer Mother   . Diabetes Mother   . Breast cancer Sister   . Cancer Sister     Review of Systems  Musculoskeletal: Positive for back pain and joint pain. Negative for myalgias, falls and neck pain.  All other systems reviewed and are negative. 14 point review of systems was performed and is negative except as detailed under history of present illness and above  PHYSICAL EXAMINATION  ECOG PERFORMANCE STATUS: 1 - Symptomatic but completely ambulatory  Filed Vitals:   07/27/14 1053  BP: 153/83  Pulse: 88  Temp: 98.4 F (36.9 C)  Resp: 20    Physical Exam  Constitutional: She is oriented to person, place, and time and well-developed, well-nourished, and in no distress.  HENT:  Head: Normocephalic and atraumatic.  Nose: Nose normal.  Mouth/Throat: Oropharynx is clear and moist. No  oropharyngeal exudate.  Eyes: Conjunctivae and EOM are normal. Pupils are equal, round, and reactive to light. Right eye exhibits no discharge. Left eye exhibits no discharge. No scleral icterus.  Neck: Normal range of motion. Neck supple. No tracheal deviation present. No thyromegaly present.  Cardiovascular: Normal rate, regular rhythm and normal heart sounds.  Exam reveals no gallop and no friction rub.   No murmur heard. Pulmonary/Chest: Effort normal and breath sounds normal. She has no wheezes. She has no rales.  Bilateral breast exam with R breast changes from surgery and XRT. No palpable masses, no nipple changes. Bilateral Axillae are without palpable adenopathy  Abdominal: Soft.  Bowel sounds are normal. She exhibits no distension and no mass. There is no tenderness. There is no rebound and no guarding.  Musculoskeletal: Normal range of motion. She exhibits no edema.  Lymphadenopathy:    She has no cervical adenopathy.  Neurological: She is alert and oriented to person, place, and time. She has normal reflexes. No cranial nerve deficit. Gait normal. Coordination normal.  Skin: Skin is warm and dry. No rash noted.  Psychiatric: Mood, memory, affect and judgment normal.  Nursing note and vitals reviewed.   LABORATORY DATA:  CBC    Component Value Date/Time   WBC 9.0 01/20/2014 0908   RBC 4.49 01/20/2014 0908   HGB 12.8 01/20/2014 0908   HCT 40.0 01/20/2014 0908   PLT 278 01/20/2014 0908   MCV 89.1 01/20/2014 0908   MCH 28.5 01/20/2014 0908   MCHC 32.0 01/20/2014 0908   RDW 14.6 01/20/2014 0908   LYMPHSABS 2.4 01/20/2014 0908   MONOABS 0.6 01/20/2014 0908   EOSABS 0.2 01/20/2014 0908   BASOSABS 0.0 01/20/2014 0908   CMP     Component Value Date/Time   NA 139 01/20/2014 0908   K 4.1 01/20/2014 0908   CL 105 01/20/2014 0908   CO2 28 01/20/2014 0908   GLUCOSE 124* 01/20/2014 0908   BUN 12 01/20/2014 0908   CREATININE 0.72 01/20/2014 0908   CALCIUM 9.5 01/20/2014 0908   PROT 7.6 01/20/2014 0908   ALBUMIN 3.7 01/20/2014 0908   AST 18 01/20/2014 0908   ALT 20 01/20/2014 0908   ALKPHOS 99 01/20/2014 0908   BILITOT 0.4 01/20/2014 0908   GFRNONAA 89* 01/20/2014 0908   GFRAA >90 01/20/2014 0908      ASSESSMENT and THERAPY PLAN:  Right breast cancer T1bN0M0 ER Positive Anastrozole initiated on 12/10/2010 plans to continue therapy at least through November of 2017. DEXA 01/25/2014 with normal bone density Mammogram 05/30/2014 Chronic LBP  IRMA breast cancer perspective she is doing well. She is up-to-date with screening mammography. She is compliant with her Arimidex and takes calcium and vitamin D daily. Bone density was performed earlier this year  and shows normal bone density. Chronic low back pain. She states it is starting to get to the point where it is interfering with some of her ADLs. I recommended a pain clinic referral and she agrees. She has a diagnosis of degenerative disc disease in the lower back. We will see her back in 6 months with repeat exam.  All questions were answered. The patient knows to call the clinic with any problems, questions or concerns. We can certainly see the patient much sooner if necessary.   This document serves as a record of services personally performed by Ancil Linsey, MD. It was created on her behalf by Janace Hoard, a trained medical scribe. The creation of this record is based on the  scribe's personal observations and the provider's statements to them. This document has been checked and approved by the attending provider.  I have reviewed the above documentation for accuracy and completeness, and I agree with the above.  Kelby Fam. Whitney Muse, MD

## 2014-07-27 NOTE — Patient Instructions (Addendum)
[place] at [facility] Discharge Instructions  RECOMMENDATIONS MADE BY THE CONSULTANT AND ANY TEST RESULTS WILL BE SENT TO YOUR REFERRING PHYSICIAN.  Exam and discussion with Dr. Whitney Muse today. Return in 6 months as scheduled for office visit.  Thank you for choosing [place] at [facility] to provide your oncology and hematology care.  To afford each patient quality time with our provider, please arrive at least 15 minutes before your scheduled appointment time.    You need to re-schedule your appointment should you arrive 10 or more minutes late.  We strive to give you quality time with our providers, and arriving late affects you and other patients whose appointments are after yours.  Also, if you no show three or more times for appointments you may be dismissed from the clinic at the providers discretion.     Again, thank you for choosing [facility].  Our hope is that these requests will decrease the amount of time that you wait before being seen by our physicians.       _____________________________________________________________  Should you have questions after your visit to [facility], please contact our office at [phone] between the hours of 8:30 a.m. and 4:30 p.m.  Voicemails left after 4:30 p.m. will not be returned until the following business day.  For prescription refill requests, have your pharmacy contact our office.

## 2014-11-21 ENCOUNTER — Other Ambulatory Visit (HOSPITAL_COMMUNITY): Payer: Self-pay | Admitting: Oncology

## 2014-11-21 DIAGNOSIS — C50311 Malignant neoplasm of lower-inner quadrant of right female breast: Secondary | ICD-10-CM

## 2014-11-21 MED ORDER — ANASTROZOLE 1 MG PO TABS: 1.0000 mg | ORAL_TABLET | Freq: Every day | ORAL | Status: DC

## 2014-12-27 ENCOUNTER — Other Ambulatory Visit (HOSPITAL_COMMUNITY): Payer: Self-pay | Admitting: Oncology

## 2014-12-27 DIAGNOSIS — C50311 Malignant neoplasm of lower-inner quadrant of right female breast: Secondary | ICD-10-CM

## 2014-12-27 MED ORDER — ANASTROZOLE 1 MG PO TABS: 1.0000 mg | ORAL_TABLET | Freq: Every day | ORAL | Status: DC

## 2015-01-15 ENCOUNTER — Encounter (HOSPITAL_COMMUNITY): Payer: Self-pay | Admitting: *Deleted

## 2015-01-15 ENCOUNTER — Emergency Department (HOSPITAL_COMMUNITY): Payer: Medicare PPO

## 2015-01-15 ENCOUNTER — Inpatient Hospital Stay (HOSPITAL_COMMUNITY)
Admission: EM | Admit: 2015-01-15 | Discharge: 2015-01-18 | DRG: 175 | Disposition: A | Discharge status: Home Health | Payer: Medicare PPO | Attending: Internal Medicine | Admitting: Internal Medicine

## 2015-01-15 DIAGNOSIS — G4733 Obstructive sleep apnea (adult) (pediatric): Secondary | ICD-10-CM | POA: Diagnosis not present

## 2015-01-15 DIAGNOSIS — I10 Essential (primary) hypertension: Secondary | ICD-10-CM | POA: Diagnosis present

## 2015-01-15 DIAGNOSIS — R0600 Dyspnea, unspecified: Secondary | ICD-10-CM | POA: Diagnosis not present

## 2015-01-15 DIAGNOSIS — E669 Obesity, unspecified: Secondary | ICD-10-CM | POA: Diagnosis present

## 2015-01-15 DIAGNOSIS — J9601 Acute respiratory failure with hypoxia: Secondary | ICD-10-CM | POA: Diagnosis present

## 2015-01-15 DIAGNOSIS — C50319 Malignant neoplasm of lower-inner quadrant of unspecified female breast: Secondary | ICD-10-CM | POA: Diagnosis not present

## 2015-01-15 DIAGNOSIS — Z87891 Personal history of nicotine dependence: Secondary | ICD-10-CM | POA: Diagnosis not present

## 2015-01-15 DIAGNOSIS — Z853 Personal history of malignant neoplasm of breast: Secondary | ICD-10-CM | POA: Diagnosis not present

## 2015-01-15 DIAGNOSIS — E119 Type 2 diabetes mellitus without complications: Secondary | ICD-10-CM | POA: Diagnosis not present

## 2015-01-15 DIAGNOSIS — M1990 Unspecified osteoarthritis, unspecified site: Secondary | ICD-10-CM | POA: Diagnosis not present

## 2015-01-15 DIAGNOSIS — Z23 Encounter for immunization: Secondary | ICD-10-CM | POA: Diagnosis not present

## 2015-01-15 DIAGNOSIS — Z7984 Long term (current) use of oral hypoglycemic drugs: Secondary | ICD-10-CM | POA: Diagnosis not present

## 2015-01-15 DIAGNOSIS — R7989 Other specified abnormal findings of blood chemistry: Secondary | ICD-10-CM | POA: Diagnosis present

## 2015-01-15 DIAGNOSIS — R071 Chest pain on breathing: Secondary | ICD-10-CM | POA: Diagnosis present

## 2015-01-15 DIAGNOSIS — I2699 Other pulmonary embolism without acute cor pulmonale: Secondary | ICD-10-CM | POA: Diagnosis present

## 2015-01-15 DIAGNOSIS — Z6841 Body Mass Index (BMI) 40.0 and over, adult: Secondary | ICD-10-CM

## 2015-01-15 DIAGNOSIS — Z170 Estrogen receptor positive status [ER+]: Secondary | ICD-10-CM

## 2015-01-15 DIAGNOSIS — I2782 Chronic pulmonary embolism: Secondary | ICD-10-CM | POA: Diagnosis not present

## 2015-01-15 DIAGNOSIS — R0609 Other forms of dyspnea: Secondary | ICD-10-CM | POA: Diagnosis present

## 2015-01-15 DIAGNOSIS — R778 Other specified abnormalities of plasma proteins: Secondary | ICD-10-CM | POA: Diagnosis present

## 2015-01-15 LAB — CBC WITH DIFFERENTIAL/PLATELET
BASOS ABS: 0 10*3/uL (ref 0.0–0.1)
BASOS PCT: 0 %
EOS PCT: 1 %
Eosinophils Absolute: 0.2 10*3/uL (ref 0.0–0.7)
HCT: 44.2 % (ref 36.0–46.0)
Hemoglobin: 14.6 g/dL (ref 12.0–15.0)
Lymphocytes Relative: 23 %
Lymphs Abs: 3.3 10*3/uL (ref 0.7–4.0)
MCH: 29.6 pg (ref 26.0–34.0)
MCHC: 33 g/dL (ref 30.0–36.0)
MCV: 89.7 fL (ref 78.0–100.0)
MONO ABS: 0.9 10*3/uL (ref 0.1–1.0)
Monocytes Relative: 6 %
Neutro Abs: 9.7 10*3/uL — ABNORMAL HIGH (ref 1.7–7.7)
Neutrophils Relative %: 70 %
Platelets: 223 10*3/uL (ref 150–400)
RBC: 4.93 MIL/uL (ref 3.87–5.11)
RDW: 14.6 % (ref 11.5–15.5)
WBC: 14.1 10*3/uL — ABNORMAL HIGH (ref 4.0–10.5)

## 2015-01-15 LAB — COMPREHENSIVE METABOLIC PANEL
ALBUMIN: 3.6 g/dL (ref 3.5–5.0)
ALT: 27 U/L (ref 14–54)
ANION GAP: 13 (ref 5–15)
AST: 28 U/L (ref 15–41)
Alkaline Phosphatase: 96 U/L (ref 38–126)
BUN: 11 mg/dL (ref 6–20)
CO2: 24 mmol/L (ref 22–32)
Calcium: 9.9 mg/dL (ref 8.9–10.3)
Chloride: 106 mmol/L (ref 101–111)
Creatinine, Ser: 0.95 mg/dL (ref 0.44–1.00)
GFR calc Af Amer: >60 mL/min (ref >60)
GFR calc non Af Amer: >60 mL/min (ref >60)
GLUCOSE: 234 mg/dL — AB (ref 65–99)
POTASSIUM: 3.9 mmol/L (ref 3.5–5.1)
Sodium: 143 mmol/L (ref 135–145)
Total Bilirubin: 0.5 mg/dL (ref 0.3–1.2)
Total Protein: 7.8 g/dL (ref 6.5–8.1)

## 2015-01-15 LAB — TROPONIN I: Troponin I: 0.75 ng/mL (ref <0.031)

## 2015-01-15 LAB — D-DIMER, QUANTITATIVE: D-Dimer, Quant: 3.33 ug/mL-FEU — ABNORMAL HIGH (ref 0.00–0.50)

## 2015-01-15 LAB — BRAIN NATRIURETIC PEPTIDE: B Natriuretic Peptide: 232 pg/mL — ABNORMAL HIGH (ref 0.0–100.0)

## 2015-01-15 MED ORDER — HEPARIN (PORCINE) IN NACL 100-0.45 UNIT/ML-% IJ SOLN
1100.0000 [IU]/h | INTRAMUSCULAR | Status: DC
  Administered 2015-01-15: 1000 [IU]/h via INTRAVENOUS
  Filled 2015-01-15: qty 250

## 2015-01-15 MED ORDER — HEPARIN BOLUS VIA INFUSION
4000.0000 [IU] | Freq: Once | INTRAVENOUS | Status: AC
  Administered 2015-01-15: 4000 [IU] via INTRAVENOUS

## 2015-01-15 MED ORDER — IOHEXOL 350 MG/ML SOLN
100.0000 mL | Freq: Once | INTRAVENOUS | Status: AC | PRN
  Administered 2015-01-15: 100 mL via INTRAVENOUS

## 2015-01-15 NOTE — Progress Notes (Signed)
eLink Physician-Brief Progress Note Patient Name: Alexa Castillo DOB: 1950/04/17 MRN: [id number]   Date of Service  01/15/2015  HPI/Events of Note  EDP called to discuss acute PE. Patient saturating well on 2 L/m with no hemodynamic instability.  RV/LV ratio 1.2 with mild troponin I elevation to 0.75. CTA shows bilateral lobar pulmonary arteries as well as clot extension into segmental arteries.   eICU Interventions  1. Systemic Anticoagulation with Heparin drip 2. Stat BNP 3. Stat Venous Duplex to assess for lower extremity clot & need for IVC filter 4. Trans-thoracic echo first thing in AM 5. Would not recommend TPA or catheter directed TPA at this time.     Intervention Category Major Interventions: Other:  Tera Partridge 01/15/2015, 8:28 PM

## 2015-01-15 NOTE — ED Notes (Signed)
States episode of chest pain and shortness of breath this morning that seemed to improve per patient. Now pain with inspiration that relieves with shallower breathing. States productive cough with green sputum.

## 2015-01-15 NOTE — H&P (Signed)
PCP:   CLAGGETT,ELIN, PA-C   Chief Complaint:  Sob, chest pain  HPI: 65 yo female h/o breast cancer in remission, OSA, DM, HTN comes in with chest pain and sob that started this am after waking up.  She reports early this am, she started have this pain in her chest throughout the day she has progressively gotten more sob with exertion and the chest pain was getting worse particularly with deep breathing.  No cough.  No fevers.  No le edema, pain or swelling.  No recent traveling, no recent surgery or injuries or trauma.  No previous h/o VTE.  She is feeling better right now in the ED, while resting denies any chest pain or sob.  Pt found to have large pulmonary emboli with evidence of right heart strain.  PCCM called at [facility], did not think patient was candidate for direct thrombolytics at this time and recommended [place] stepdown admission.  Pt referred for admission for PE now on heparin drip.  All of pt cancer screening is up to date per her report.  Review of Systems:  Positive and negative as per HPI otherwise all other systems are negative  Past Medical History: Past Medical History  Diagnosis Date  . Diabetes mellitus   . Hypertension   . Obesity   . Low back pain   . Arthritis     back/saw Dr. Aline Brochure  . Sleep apnea 04/2013  . Cancer [facility])     breast  . Breast cancer [facility])    Past Surgical History  Procedure Laterality Date  . Tubal ligation    . Biopsy breast      Medications: Prior to Admission medications   Medication Sig Start Date End Date Taking? Authorizing Provider  anastrozole (ARIMIDEX) 1 MG tablet Take 1 tablet (1 mg total) by mouth daily. 12/27/14  Yes Baird Cancer, PA-C  calcium-vitamin D (OSCAL WITH D) 500-200 MG-UNIT per tablet Take 1 tablet by mouth 2 (two) times daily.   Yes Historical Provider, MD  glipiZIDE (GLUCOTROL) 10 MG tablet Take 10 mg by mouth 2 (two) times daily before a meal.  06/18/10  Yes Historical Provider, MD  metFORMIN  (GLUCOPHAGE) 1000 MG tablet Take 1,000 mg by mouth 2 (two) times daily. 06/20/14  Yes Historical Provider, MD  metoprolol succinate (TOPROL-XL) 25 MG 24 hr tablet Take 25 mg by mouth daily. 06/20/14  Yes Historical Provider, MD  naproxen (NAPROSYN) 500 MG tablet Take 1 tablet by mouth 2 (two) times daily as needed for mild pain or moderate pain.  06/20/14  Yes Historical Provider, MD  Omega-3 Fatty Acids (FISH OIL) 1000 MG CPDR Take 1,000 mg by mouth daily.    Yes Historical Provider, MD  omeprazole (PRILOSEC) 20 MG capsule Take 20 mg by mouth every other day. 04/18/13  Yes Historical Provider, MD    Allergies:  No Known Allergies  Social History:  reports that she quit smoking about 20 years ago. Her smoking use included Cigarettes. She has a 25.5 pack-year smoking history. She has never used smokeless tobacco. She reports that she does not drink alcohol or use illicit drugs.  Family History: Family History  Problem Relation Age of Onset  . Liver cancer Mother   . Cancer Mother   . Diabetes Mother   . Breast cancer Sister   . Cancer Sister     Physical Exam: Filed Vitals:   01/15/15 2145 01/15/15 2200 01/15/15 2202 01/15/15 2215  BP:  131/85 131/85  Pulse: 97 96  95  Temp:      TempSrc:      Resp: 27 24  27   Height:      Weight:      SpO2: 97% 96%  93%   General appearance: alert, cooperative and no distress Head: Normocephalic, without obvious abnormality, atraumatic Eyes: negative Nose: Nares normal. Septum midline. Mucosa normal. No drainage or sinus tenderness. Neck: no JVD and supple, symmetrical, trachea midline Lungs: clear to auscultation bilaterally Heart: regular rate and rhythm, S1, S2 normal, no murmur, click, rub or gallop Abdomen: soft, non-tender; bowel sounds normal; no masses,  no organomegaly Extremities: extremities normal, atraumatic, no cyanosis or edema Pulses: 2+ and symmetric Skin: Skin color, texture, turgor normal. No rashes or lesions Neurologic:  Grossly normal  Labs on Admission:   Recent Labs  01/15/15 1632  NA 143  K 3.9  CL 106  CO2 24  GLUCOSE 234*  BUN 11  CREATININE 0.95  CALCIUM 9.9    Recent Labs  01/15/15 1632  AST 28  ALT 27  ALKPHOS 96  BILITOT 0.5  PROT 7.8  ALBUMIN 3.6    Recent Labs  01/15/15 1632  WBC 14.1*  NEUTROABS 9.7*  HGB 14.6  HCT 44.2  MCV 89.7  PLT 223    Recent Labs  01/15/15 1632  TROPONINI 0.75*    Radiological Exams on Admission: Dg Chest 2 View  01/15/2015  CLINICAL DATA:  Generalized chest pain and shortness of breath. This was intermittent and began today. History of breast cancer. Former smoker. Worse with deep inspiration. EXAM: CHEST  2 VIEW COMPARISON:  None. FINDINGS: Normal cardiac size. Prominence of the main pulmonary artery on the AP view equal in size to the diameter of the transverse arch could suggest RIGHT heart strain. Consider CT scanning for assessment for pulmonary emboli. No other mediastinal abnormalities. No focal infiltrates or wedge-shaped defects. No evidence for pulmonary edema. No pleural effusion or pneumothorax. Thoracic spondylosis. IMPRESSION: Prominence of the main PICC coronary artery on the AP view could suggest RIGHT heart strain. Recommend CTA chest for further evaluation. Electronically Signed   By: Staci Righter M.D.   On: 01/15/2015 18:57   Ct Angio Chest Pe W/cm &/or Wo Cm  01/15/2015  CLINICAL DATA:  Chest pain and shortness of breath earlier today. Diabetes and hypertension. EXAM: CT ANGIOGRAPHY CHEST WITH CONTRAST TECHNIQUE: Multidetector CT imaging of the chest was performed using the standard protocol during bolus administration of intravenous contrast. Multiplanar CT image reconstructions and MIPs were obtained to evaluate the vascular anatomy. CONTRAST:  152mL OMNIPAQUE IOHEXOL 350 MG/ML SOLN COMPARISON:  Chest radiograph earlier today. FINDINGS: Mediastinum: Extensive clot burden throughout the BILATERAL upper lobe pulmonary arteries,  RIGHT greater than LEFT. Significant proximal truncation of the RIGHT upper lobe pulmonary artery as seen on coronal image 66. Lesser amount of clot extends into the BILATERAL segmental lower lobe pulmonary arteries. Abnormal RV/LV ratio of 1.24, based on chamber measurements of 42 mm RV and 34 mm LV. Mild cardiac enlargement without significant coronary artery calcification. No abnormality of the aorta or great vessels. No mediastinal adenopathy of significance. Normal-appearing esophagus. Lungs/Pleura: No consolidative airspace disease. No pleural effusions. No suspicious appearing pulmonary nodules or masses. Upper Abdomen: Visualized portions of the upper abdomen are unremarkable. Hepatic steatosis. Musculoskeletal: No aggressive appearing lytic or blastic lesions are noted in the visualized portions of the skeleton. Review of the MIP images confirms the above findings. IMPRESSION: Positive for BILATERAL lobar acute  PE with CT evidence of right heart strain (RV/LV Ratio = 1.24) consistent with at least submassive (intermediate risk) PE. The presence of right heart strain has been associated with an increased risk of morbidity and mortality. Please activate Code PE by paging [phone]. These results were called by telephone by myself at the time of interpretation on 01/15/2015 at 7:45 pm to Dr. Milton Ferguson , who verbally acknowledged these results. Electronically Signed   By: Staci Righter M.D.   On: 01/15/2015 19:48   US Venous Img Lower Bilateral  01/15/2015  CLINICAL DATA:  History of pulmonary embolus. EXAM: BILATERAL LOWER EXTREMITY VENOUS DOPPLER ULTRASOUND TECHNIQUE: Gray-scale sonography with graded compression, as well as color Doppler and duplex ultrasound were performed to evaluate the lower extremity deep venous systems from the level of the common femoral vein and including the common femoral, femoral, profunda femoral, popliteal and calf veins including the posterior tibial, peroneal and  gastrocnemius veins when visible. The superficial great saphenous vein was also interrogated. Spectral Doppler was utilized to evaluate flow at rest and with distal augmentation maneuvers in the common femoral, femoral and popliteal veins. COMPARISON:  CT scan of same day. FINDINGS: RIGHT LOWER EXTREMITY Common Femoral Vein: No evidence of thrombus. Normal compressibility, respiratory phasicity and response to augmentation. Saphenofemoral Junction: No evidence of thrombus. Normal compressibility and flow on color Doppler imaging. Profunda Femoral Vein: No evidence of thrombus. Normal compressibility and flow on color Doppler imaging. Femoral Vein: No evidence of thrombus. Normal compressibility, respiratory phasicity and response to augmentation. Popliteal Vein: No evidence of thrombus. Normal compressibility, respiratory phasicity and response to augmentation. Calf Veins: No evidence of thrombus. Normal compressibility and flow on color Doppler imaging. Superficial Great Saphenous Vein: No evidence of thrombus. Normal compressibility and flow on color Doppler imaging. Venous Reflux:  None. Other Findings:  None. LEFT LOWER EXTREMITY Common Femoral Vein: No evidence of thrombus. Normal compressibility, respiratory phasicity and response to augmentation. Saphenofemoral Junction: No evidence of thrombus. Normal compressibility and flow on color Doppler imaging. Profunda Femoral Vein: No evidence of thrombus. Normal compressibility and flow on color Doppler imaging. Femoral Vein: No evidence of thrombus. Normal compressibility, respiratory phasicity and response to augmentation. Popliteal Vein: No evidence of thrombus. Normal compressibility, respiratory phasicity and response to augmentation. Calf Veins: No evidence of thrombus. Normal compressibility and flow on color Doppler imaging. Superficial Great Saphenous Vein: No evidence of thrombus. Normal compressibility and flow on color Doppler imaging. Venous Reflux:   None. Other Findings:  None. IMPRESSION: No evidence of deep venous thrombosis seen in either lower extremity. Electronically Signed   By: Marijo Conception, M.D.   On: 01/15/2015 21:38   Old chart reviewed Case discussed with dr zammit ekg reviewed sinus tach no acute issues   Assessment/Plan  65 yo female with progressive worsening dyspnea and pleuritic chest pain consistent with acute pulmonary emboli showing evidence of right heart strain  Principal Problem:   Pulmonary embolism ([place])- continue on heparin gtt.  Appears her cancer screening is up to date and appears this is unprovoked.  Will transfer to [place] stepdown in case the event she deteriorates and needs further aggressive treatment that is not available at [facility].  Pt currently stable.  Troponin up to 0.7, will serial overnight and check cardiac echo in the am.  Will need complete evaluation and verification of cancer screening at some point.  Active Problems:   Right breast cancer [id number] ER Positive 2012- in remission, on arimidex  Obstructive sleep apnea syndrome in adult- noted   Arthritis- noted   Hypertension- will hold metoprolol at this time   Chest pain on breathing- due to abovve PE   Dyspnea on exertion- due to above PE   Troponin level elevated- due to right heart strain  Transfer to [place].  Full code.  Stepdown.    DAVID,RACHAL A 01/15/2015, 11:00 PM

## 2015-01-15 NOTE — ED Notes (Signed)
CRITICAL VALUE ALERT  Critical value received:  Troponin  Date of notification:  01/15/15  Time of notification:  1803  Critical value read back:Yes.    Nurse who received alert:  Maxwell Caul, RN  MD notified (1st page):  Dr Roderic Palau  Time of first page:  [phone]

## 2015-01-15 NOTE — ED Provider Notes (Signed)
CSN: [id number]     Arrival date & time 01/15/15  1555 History   First MD Initiated Contact with Patient 01/15/15 1647     Chief Complaint  Patient presents with  . Chest Pain     (Consider location/radiation/quality/duration/timing/severity/associated sxs/prior Treatment) Patient is a 65 y.o. female presenting with chest pain. The history is provided by the patient (Patient states she started with chest pain today. Pain is worse with inspiration.).  Chest Pain Pain location:  L chest Pain quality: aching   Pain radiates to:  Does not radiate Pain severity:  Moderate Onset quality:  Sudden Timing:  Constant Progression:  Waxing and waning Chronicity:  New Context: breathing   Associated symptoms: shortness of breath   Associated symptoms: no abdominal pain, no back pain, no cough, no fatigue and no headache     Past Medical History  Diagnosis Date  . Diabetes mellitus   . Hypertension   . Obesity   . Low back pain   . Arthritis     back/saw Dr. Aline Brochure  . Sleep apnea 04/2013  . Cancer [facility])     breast  . Breast cancer [facility])    Past Surgical History  Procedure Laterality Date  . Tubal ligation    . Biopsy breast     Family History  Problem Relation Age of Onset  . Liver cancer Mother   . Cancer Mother   . Diabetes Mother   . Breast cancer Sister   . Cancer Sister    Social History  Substance Use Topics  . Smoking status: Former Smoker -- 1.50 packs/day for 17 years    Types: Cigarettes    Quit date: 12/18/1994  . Smokeless tobacco: Never Used  . Alcohol Use: No   OB History    Gravida Para Term Preterm AB TAB SAB Ectopic Multiple Living   3 2 2  1  1   2      Review of Systems  Constitutional: Negative for appetite change and fatigue.  HENT: Negative for congestion, ear discharge and sinus pressure.   Eyes: Negative for discharge.  Respiratory: Positive for shortness of breath. Negative for cough.   Cardiovascular: Positive for chest pain.   Gastrointestinal: Negative for abdominal pain and diarrhea.  Genitourinary: Negative for frequency and hematuria.  Musculoskeletal: Negative for back pain.  Skin: Negative for rash.  Neurological: Negative for seizures and headaches.  Psychiatric/Behavioral: Negative for hallucinations.      Allergies  Review of patient's allergies indicates no known allergies.  Home Medications   Prior to Admission medications   Medication Sig Start Date End Date Taking? Authorizing Provider  anastrozole (ARIMIDEX) 1 MG tablet Take 1 tablet (1 mg total) by mouth daily. 12/27/14  Yes Baird Cancer, PA-C  calcium-vitamin D (OSCAL WITH D) 500-200 MG-UNIT per tablet Take 1 tablet by mouth 2 (two) times daily.   Yes Historical Provider, MD  glipiZIDE (GLUCOTROL) 10 MG tablet Take 10 mg by mouth 2 (two) times daily before a meal.  06/18/10  Yes Historical Provider, MD  metFORMIN (GLUCOPHAGE) 1000 MG tablet Take 1,000 mg by mouth 2 (two) times daily. 06/20/14  Yes Historical Provider, MD  metoprolol succinate (TOPROL-XL) 25 MG 24 hr tablet Take 25 mg by mouth daily. 06/20/14  Yes Historical Provider, MD  naproxen (NAPROSYN) 500 MG tablet Take 1 tablet by mouth 2 (two) times daily as needed for mild pain or moderate pain.  06/20/14  Yes Historical Provider, MD  Omega-3 Fatty Acids ([place]  OIL) 1000 MG CPDR Take 1,000 mg by mouth daily.    Yes Historical Provider, MD  omeprazole (PRILOSEC) 20 MG capsule Take 20 mg by mouth every other day. 04/18/13  Yes Historical Provider, MD   BP 131/85 mmHg  Pulse 95  Temp(Src) 98 F (36.7 C) (Oral)  Resp 27  Ht 5\' 2"  (1.575 m)  Wt 245 lb (111.131 kg)  BMI 44.80 kg/m2  SpO2 93% Physical Exam  Constitutional: She is oriented to person, place, and time. She appears well-developed.  HENT:  Head: Normocephalic.  Eyes: Conjunctivae and EOM are normal. No scleral icterus.  Neck: Neck supple. No thyromegaly present.  Cardiovascular: Normal rate and regular rhythm.  Exam reveals  no gallop and no friction rub.   No murmur heard. Pulmonary/Chest: No stridor. She has no wheezes. She has no rales. She exhibits no tenderness.  Abdominal: She exhibits no distension. There is no tenderness. There is no rebound.  Musculoskeletal: Normal range of motion. She exhibits no edema.  Lymphadenopathy:    She has no cervical adenopathy.  Neurological: She is oriented to person, place, and time. She exhibits normal muscle tone. Coordination normal.  Skin: No rash noted. No erythema.  Psychiatric: She has a normal mood and affect. Her behavior is normal.    ED Course  Procedures (including critical care time) Labs Review Labs Reviewed  CBC WITH DIFFERENTIAL/PLATELET - Abnormal; Notable for the following:    WBC 14.1 (*)    Neutro Abs 9.7 (*)    All other components within normal limits  COMPREHENSIVE METABOLIC PANEL - Abnormal; Notable for the following:    Glucose, Bld 234 (*)    All other components within normal limits  TROPONIN I - Abnormal; Notable for the following:    Troponin I 0.75 (*)    All other components within normal limits  D-DIMER, QUANTITATIVE (NOT AT [facility]) - Abnormal; Notable for the following:    D-Dimer, Quant 3.33 (*)    All other components within normal limits  BRAIN NATRIURETIC PEPTIDE - Abnormal; Notable for the following:    B Natriuretic Peptide 232.0 (*)    All other components within normal limits    Imaging Review Dg Chest 2 View  01/15/2015  CLINICAL DATA:  Generalized chest pain and shortness of breath. This was intermittent and began today. History of breast cancer. Former smoker. Worse with deep inspiration. EXAM: CHEST  2 VIEW COMPARISON:  None. FINDINGS: Normal cardiac size. Prominence of the main pulmonary artery on the AP view equal in size to the diameter of the transverse arch could suggest RIGHT heart strain. Consider CT scanning for assessment for pulmonary emboli. No other mediastinal abnormalities. No focal infiltrates or  wedge-shaped defects. No evidence for pulmonary edema. No pleural effusion or pneumothorax. Thoracic spondylosis. IMPRESSION: Prominence of the main PICC coronary artery on the AP view could suggest RIGHT heart strain. Recommend CTA chest for further evaluation. Electronically Signed   By: Staci Righter M.D.   On: 01/15/2015 18:57   Ct Angio Chest Pe W/cm &/or Wo Cm  01/15/2015  CLINICAL DATA:  Chest pain and shortness of breath earlier today. Diabetes and hypertension. EXAM: CT ANGIOGRAPHY CHEST WITH CONTRAST TECHNIQUE: Multidetector CT imaging of the chest was performed using the standard protocol during bolus administration of intravenous contrast. Multiplanar CT image reconstructions and MIPs were obtained to evaluate the vascular anatomy. CONTRAST:  115mL OMNIPAQUE IOHEXOL 350 MG/ML SOLN COMPARISON:  Chest radiograph earlier today. FINDINGS: Mediastinum: Extensive clot burden throughout  the BILATERAL upper lobe pulmonary arteries, RIGHT greater than LEFT. Significant proximal truncation of the RIGHT upper lobe pulmonary artery as seen on coronal image 66. Lesser amount of clot extends into the BILATERAL segmental lower lobe pulmonary arteries. Abnormal RV/LV ratio of 1.24, based on chamber measurements of 42 mm RV and 34 mm LV. Mild cardiac enlargement without significant coronary artery calcification. No abnormality of the aorta or great vessels. No mediastinal adenopathy of significance. Normal-appearing esophagus. Lungs/Pleura: No consolidative airspace disease. No pleural effusions. No suspicious appearing pulmonary nodules or masses. Upper Abdomen: Visualized portions of the upper abdomen are unremarkable. Hepatic steatosis. Musculoskeletal: No aggressive appearing lytic or blastic lesions are noted in the visualized portions of the skeleton. Review of the MIP images confirms the above findings. IMPRESSION: Positive for BILATERAL lobar acute PE with CT evidence of right heart strain (RV/LV Ratio = 1.24)  consistent with at least submassive (intermediate risk) PE. The presence of right heart strain has been associated with an increased risk of morbidity and mortality. Please activate Code PE by paging [phone]. These results were called by telephone by myself at the time of interpretation on 01/15/2015 at 7:45 pm to Dr. Milton Ferguson , who verbally acknowledged these results. Electronically Signed   By: Staci Righter M.D.   On: 01/15/2015 19:48   US Venous Img Lower Bilateral  01/15/2015  CLINICAL DATA:  History of pulmonary embolus. EXAM: BILATERAL LOWER EXTREMITY VENOUS DOPPLER ULTRASOUND TECHNIQUE: Gray-scale sonography with graded compression, as well as color Doppler and duplex ultrasound were performed to evaluate the lower extremity deep venous systems from the level of the common femoral vein and including the common femoral, femoral, profunda femoral, popliteal and calf veins including the posterior tibial, peroneal and gastrocnemius veins when visible. The superficial great saphenous vein was also interrogated. Spectral Doppler was utilized to evaluate flow at rest and with distal augmentation maneuvers in the common femoral, femoral and popliteal veins. COMPARISON:  CT scan of same day. FINDINGS: RIGHT LOWER EXTREMITY Common Femoral Vein: No evidence of thrombus. Normal compressibility, respiratory phasicity and response to augmentation. Saphenofemoral Junction: No evidence of thrombus. Normal compressibility and flow on color Doppler imaging. Profunda Femoral Vein: No evidence of thrombus. Normal compressibility and flow on color Doppler imaging. Femoral Vein: No evidence of thrombus. Normal compressibility, respiratory phasicity and response to augmentation. Popliteal Vein: No evidence of thrombus. Normal compressibility, respiratory phasicity and response to augmentation. Calf Veins: No evidence of thrombus. Normal compressibility and flow on color Doppler imaging. Superficial Great Saphenous Vein:  No evidence of thrombus. Normal compressibility and flow on color Doppler imaging. Venous Reflux:  None. Other Findings:  None. LEFT LOWER EXTREMITY Common Femoral Vein: No evidence of thrombus. Normal compressibility, respiratory phasicity and response to augmentation. Saphenofemoral Junction: No evidence of thrombus. Normal compressibility and flow on color Doppler imaging. Profunda Femoral Vein: No evidence of thrombus. Normal compressibility and flow on color Doppler imaging. Femoral Vein: No evidence of thrombus. Normal compressibility, respiratory phasicity and response to augmentation. Popliteal Vein: No evidence of thrombus. Normal compressibility, respiratory phasicity and response to augmentation. Calf Veins: No evidence of thrombus. Normal compressibility and flow on color Doppler imaging. Superficial Great Saphenous Vein: No evidence of thrombus. Normal compressibility and flow on color Doppler imaging. Venous Reflux:  None. Other Findings:  None. IMPRESSION: No evidence of deep venous thrombosis seen in either lower extremity. Electronically Signed   By: Marijo Conception, M.D.   On: 01/15/2015 21:38   I have personally  reviewed and evaluated these images and lab results as part of my medical decision-making.   EKG Interpretation   Date/Time:  Monday January 15 2015 16:09:01 EST Ventricular Rate:  113 PR Interval:  118 QRS Duration: 80 QT Interval:  340 QTC Calculation: 466 R Axis:   3 Text Interpretation:  Sinus tachycardia Otherwise normal ECG Confirmed by  Payge Eppes  MD, Brittyn Salaz [phone]) on 01/15/2015 4:53:59 PM     CRITICAL CARE Performed by: Sherriann Szuch L Total critical care time: 45 minutes Critical care time was exclusive of separately billable procedures and treating other patients. Critical care was necessary to treat or prevent imminent or life-threatening deterioration. Critical care was time spent personally by me on the following activities: development of treatment plan  with patient and/or surrogate as well as nursing, discussions with consultants, evaluation of patient's response to treatment, examination of patient, obtaining history from patient or surrogate, ordering and performing treatments and interventions, ordering and review of laboratory studies, ordering and review of radiographic studies, pulse oximetry and re-evaluation of patient's condition.  MDM   Final diagnoses:  PE (pulmonary thromboembolism) ([place])    Patient has a large PE. I spoke with critical care who wished the patient to have venous Doppler tonight to decide whether the patient needs. Doppler was negative. patient was put on heparin initially.  I spoke with [facility] hospitalists who felt the patient needed to be admitted over [facility] hospital    Milton Ferguson, MD 01/15/15 2231

## 2015-01-15 NOTE — ED Notes (Addendum)
Pt reports chest pain and shortness of breath today, states pain is better at this time, but still present. States pain seems to be worse with deep inspiration.

## 2015-01-16 ENCOUNTER — Inpatient Hospital Stay (HOSPITAL_COMMUNITY): Payer: Medicare PPO

## 2015-01-16 DIAGNOSIS — G4733 Obstructive sleep apnea (adult) (pediatric): Secondary | ICD-10-CM

## 2015-01-16 DIAGNOSIS — R0600 Dyspnea, unspecified: Secondary | ICD-10-CM

## 2015-01-16 DIAGNOSIS — R0989 Other specified symptoms and signs involving the circulatory and respiratory systems: Secondary | ICD-10-CM

## 2015-01-16 LAB — BASIC METABOLIC PANEL
ANION GAP: 12 (ref 5–15)
BUN: 7 mg/dL (ref 6–20)
CALCIUM: 9.4 mg/dL (ref 8.9–10.3)
CO2: 25 mmol/L (ref 22–32)
Chloride: 103 mmol/L (ref 101–111)
Creatinine, Ser: 0.85 mg/dL (ref 0.44–1.00)
GLUCOSE: 191 mg/dL — AB (ref 65–99)
POTASSIUM: 3.6 mmol/L (ref 3.5–5.1)
Sodium: 140 mmol/L (ref 135–145)

## 2015-01-16 LAB — TROPONIN I
TROPONIN I: 0.8 ng/mL — AB (ref <0.031)
TROPONIN I: 0.71 ng/mL — AB (ref <0.031)
TROPONIN I: 0.64 ng/mL — AB (ref <0.031)

## 2015-01-16 LAB — GLUCOSE, CAPILLARY
GLUCOSE-CAPILLARY: 175 mg/dL — AB (ref 65–99)
GLUCOSE-CAPILLARY: 240 mg/dL — AB (ref 65–99)
Glucose-Capillary: 197 mg/dL — ABNORMAL HIGH (ref 65–99)
Glucose-Capillary: 271 mg/dL — ABNORMAL HIGH (ref 65–99)
Glucose-Capillary: 230 mg/dL — ABNORMAL HIGH (ref 65–99)

## 2015-01-16 LAB — CBC
HEMATOCRIT: 40.7 % (ref 36.0–46.0)
HEMOGLOBIN: 13.4 g/dL (ref 12.0–15.0)
MCH: 28.8 pg (ref 26.0–34.0)
MCHC: 32.9 g/dL (ref 30.0–36.0)
MCV: 87.5 fL (ref 78.0–100.0)
Platelets: 255 10*3/uL (ref 150–400)
RBC: 4.65 MIL/uL (ref 3.87–5.11)
RDW: 14.8 % (ref 11.5–15.5)
WBC: 12.3 10*3/uL — ABNORMAL HIGH (ref 4.0–10.5)

## 2015-01-16 LAB — HEPARIN LEVEL (UNFRACTIONATED)
HEPARIN UNFRACTIONATED: 0.31 [IU]/mL (ref 0.30–0.70)
HEPARIN UNFRACTIONATED: 0.44 [IU]/mL (ref 0.30–0.70)
Heparin Unfractionated: 0.21 IU/mL — ABNORMAL LOW (ref 0.30–0.70)
Heparin Unfractionated: 0.43 IU/mL (ref 0.30–0.70)

## 2015-01-16 LAB — MRSA PCR SCREENING: MRSA BY PCR: NEGATIVE

## 2015-01-16 MED ORDER — ONDANSETRON HCL 4 MG/2ML IJ SOLN
4.0000 mg | Freq: Four times a day (QID) | INTRAMUSCULAR | Status: DC | PRN
Start: 2015-01-16 — End: 2015-01-18

## 2015-01-16 MED ORDER — SODIUM CHLORIDE 0.9 % IJ SOLN: 3.0000 mL | INTRAMUSCULAR | Status: DC | PRN

## 2015-01-16 MED ORDER — SODIUM CHLORIDE 0.9 % IV SOLN: 250.0000 mL | INTRAVENOUS | Status: DC | PRN

## 2015-01-16 MED ORDER — HEPARIN (PORCINE) IN NACL 100-0.45 UNIT/ML-% IJ SOLN
1300.0000 [IU]/h | INTRAMUSCULAR | Status: DC
  Administered 2015-01-16 – 2015-01-17 (×2): 1300 [IU]/h via INTRAVENOUS
  Filled 2015-01-16 (×3): qty 250

## 2015-01-16 MED ORDER — PNEUMOCOCCAL VAC POLYVALENT 25 MCG/0.5ML IJ INJ
0.5000 mL | INJECTION | INTRAMUSCULAR | Status: AC
  Administered 2015-01-18: 0.5 mL via INTRAMUSCULAR
  Filled 2015-01-16 (×2): qty 0.5

## 2015-01-16 MED ORDER — MORPHINE SULFATE (PF) 2 MG/ML IV SOLN: 1.0000 mg | INTRAVENOUS | Status: DC | PRN

## 2015-01-16 MED ORDER — ONDANSETRON HCL 4 MG PO TABS: 4.0000 mg | ORAL_TABLET | Freq: Four times a day (QID) | ORAL | Status: DC | PRN

## 2015-01-16 MED ORDER — CETYLPYRIDINIUM CHLORIDE 0.05 % MT LIQD
7.0000 mL | Freq: Two times a day (BID) | OROMUCOSAL | Status: DC
  Administered 2015-01-17 – 2015-01-18 (×3): 7 mL via OROMUCOSAL

## 2015-01-16 MED ORDER — METOPROLOL SUCCINATE ER 25 MG PO TB24
25.0000 mg | ORAL_TABLET | Freq: Every day | ORAL | Status: DC
  Administered 2015-01-16 – 2015-01-18 (×3): 25 mg via ORAL
  Filled 2015-01-16 (×3): qty 1

## 2015-01-16 MED ORDER — SODIUM CHLORIDE 0.9 % IJ SOLN
3.0000 mL | Freq: Two times a day (BID) | INTRAMUSCULAR | Status: DC
  Administered 2015-01-16 – 2015-01-18 (×4): 3 mL via INTRAVENOUS

## 2015-01-16 MED ORDER — INSULIN ASPART 100 UNIT/ML ~~LOC~~ SOLN
0.0000 [IU] | Freq: Three times a day (TID) | SUBCUTANEOUS | Status: DC
  Administered 2015-01-16: 3 [IU] via SUBCUTANEOUS
  Administered 2015-01-16: 2 [IU] via SUBCUTANEOUS
  Administered 2015-01-16 – 2015-01-17 (×3): 5 [IU] via SUBCUTANEOUS
  Administered 2015-01-17: 2 [IU] via SUBCUTANEOUS
  Administered 2015-01-18: 3 [IU] via SUBCUTANEOUS
  Administered 2015-01-18: 5 [IU] via SUBCUTANEOUS

## 2015-01-16 MED ORDER — HEPARIN BOLUS VIA INFUSION
2000.0000 [IU] | Freq: Once | INTRAVENOUS | Status: AC
  Administered 2015-01-16: 2000 [IU] via INTRAVENOUS
  Filled 2015-01-16: qty 2000

## 2015-01-16 NOTE — Progress Notes (Signed)
SATURATION QUALIFICATIONS: (This note is used to comply with regulatory documentation for home oxygen)  Patient Saturations on Room Air at Rest = 88%  Patient Saturations on Room Air while Ambulating = 77%  Patient Saturations on 2 Liters of oxygen while Ambulating = 92-93%  Please briefly explain why patient needs home oxygen:   Low O2 saturation when off of supplemental O2

## 2015-01-16 NOTE — Care Management Note (Addendum)
Case Management Note  Patient Details  Name: DELISE HAMIDI MRN: [id number] Date of Birth: 10-31-1950  Subjective/Objective: Pt admitted for Bilateral PE. Plan will be to return home once stable on Eliquis. CM has benefits check in process and will make pt aware of cost once completed.                     Action/Plan: 30 day free card to be provided to pt before d/c. Pt will need Rx for 30 day free no refills. No  Further needs from CM at this time.    Expected Discharge Date:                  Expected Discharge Plan:  Home/Self Care  In-House Referral:  NA  Discharge planning Services  CM Consult, Medication Assistance  Post Acute Care Choice:  NA Choice offered to:  NA  DME Arranged:  Oxygen DME Agency:  [facility] HH Arranged:  NA HH Agency:  NA  Status of Service:  Completed, signed off  Medicare Important Message Given:    Date Medicare IM Given:    Medicare IM give by:    Date Additional Medicare IM Given:    Additional Medicare Important Message give by:     If discussed at [place] of Stay Meetings, dates discussed:    Additional Comments: 1147 01-18-15 Jacqlyn Krauss, RN,BSN [phone] Plan will now be home on Xarelto: Co pay will be the same as Eliquis dosing below. CM did speak with pt in regards to disposition/ medication needs. [facility] [place] on [address]. Medication Xarelto is available at pharmacy. CM spoke with pt and the co pay is a hefty amount for pt. Per pt she lives in a [place] and only gets $747.00 a month. Pt may qualify for the patient assistance via the company. CM did provide pt with the patient assistance paperwork. CM will discuss with MD if he will fill out. If not papers will be given to patient and she can have her PCP fill out as soon as possible for her to see if approved for year supply free. Pt will need 02 for home. CM did call [place] for DME. Family to provide transport for patient.  No further needs  from CM at this time.   [place] 01-16-15 Jacqlyn Krauss, [place] [phone] Will make pt aware of cost:Per rep at [facility]:  Could only quote 90 day mail order price, fax # [phone] for rx  90 day: tier 3, $456.00, no auth required  Bethena Roys, RN 01/16/2015, 10:06 AM

## 2015-01-16 NOTE — Progress Notes (Addendum)
ANTICOAGULATION CONSULT NOTE - Follow Up Consult  Pharmacy Consult for Heparin Indication: PE  No Known Allergies  Patient Measurements: Height: 5\' 2"  (157.5 cm) Weight: 242 lb 14.4 oz (110.179 kg) IBW/kg (Calculated) : 50.1 Heparin Dosing Weight: 78 kg  Vital Signs: Temp: 99.4 F (37.4 C) (01/03 1530) Temp Source: Oral (01/03 1530) BP: 131/78 mmHg (01/03 1530) Pulse Rate: 105 (01/03 1417)  Labs:  Recent Labs  01/15/15 1632 01/16/15 0207 01/16/15 0755 01/16/15 0920 01/16/15 1350 01/16/15 1510  HGB 14.6 13.4  --   --   --   --   HCT 44.2 40.7  --   --   --   --   PLT 223 255  --   --   --   --   HEPARINUNFRC  --  0.31  --  0.21*  --  0.43  CREATININE 0.95 0.85  --   --   --   --   TROPONINI 0.75* 0.80* 0.71*  --  0.64*  --     Estimated Creatinine Clearance: 77.2 mL/min (by C-G formula based on Cr of 0.85).  Assessment: 65 yo F who presented with chest pain and SOB that started this AM. Patient found to have large pulmonary emboli with evidence of right heart strain. Transferred from [facility] to [facility] last night. Heparin drip rate increased to 1300 units/hr after last level of 0.21. HL is now therapeutic at 0.43. Check 6 hr confirmatory HL. CBC stable, no s/s of bleed.  Goal of Therapy:  Heparin level 0.3-0.7 units/ml Monitor platelets by anticoagulation protocol: Yes  Plan:  Continue heparin gtt at 1,300 units/hr Check 6 hr cHL Monitor daily HL, CBC, s/s of bleed  Elenor Quinones, PharmD, BCPS Clinical Pharmacist Pager [phone] 01/16/2015 5:03 PM   ADDENDUM:  Confirmatory HL is therapeutic. Continue current rate.

## 2015-01-16 NOTE — Plan of Care (Signed)
Problem: Safety: Goal: Ability to remain free from injury will improve Outcome: Completed/Met Date Met:  01/16/15 Patient educated about fall prevention and safety. Patient verbalizes understanding. Bed alarm on; call bell within reach.  Problem: Tissue Perfusion: Goal: Risk factors for ineffective tissue perfusion will decrease Outcome: Progressing Patient was admitted with bilateral PE. Patient is currently on a heparin drip for anticoagulation. Patient is maintaining oxygen saturations on 2L nasal cannula.

## 2015-01-16 NOTE — Progress Notes (Addendum)
UR Completed Charrie Mcconnon Graves-Bigelow, RN,BSN [phone]  

## 2015-01-16 NOTE — Progress Notes (Signed)
ANTICOAGULATION CONSULT NOTE - Follow up  Pharmacy Consult for HEPARIN IV infusion  Indication: pulmonary embolus  No Known Allergies  Patient Measurements: Height: 5\' 2"  (157.5 cm) Weight: 242 lb 14.4 oz (110.179 kg) IBW/kg (Calculated) : 50.1 Heparin Dosing Weight: 78 kg   Vital Signs: Temp: 99 F (37.2 C) (01/03 0735) Temp Source: Oral (01/03 0735) BP: 147/94 mmHg (01/03 0558) Pulse Rate: 109 (01/03 0558)  Labs:  Recent Labs  01/15/15 1632 01/16/15 0207 01/16/15 0755 01/16/15 0920  HGB 14.6 13.4  --   --   HCT 44.2 40.7  --   --   PLT 223 255  --   --   HEPARINUNFRC  --  0.31  --  0.21*  CREATININE 0.95 0.85  --   --   TROPONINI 0.75* 0.80* 0.71*  --     Estimated Creatinine Clearance: 77.2 mL/min (by C-G formula based on Cr of 0.85).   Medical History: Past Medical History  Diagnosis Date  . Diabetes mellitus   . Hypertension   . Obesity   . Low back pain   . Arthritis     back/saw Dr. Aline Brochure  . Sleep apnea 04/2013  . Cancer ([place])     breast  . Breast cancer ([place])     Medications:  Prescriptions prior to admission  Medication Sig Dispense Refill Last Dose  . anastrozole (ARIMIDEX) 1 MG tablet Take 1 tablet (1 mg total) by mouth daily. 90 tablet 1 01/14/2015 at 1100  . calcium-vitamin D (OSCAL WITH D) 500-200 MG-UNIT per tablet Take 1 tablet by mouth 2 (two) times daily.   01/14/2015 at Unknown time  . glipiZIDE (GLUCOTROL) 10 MG tablet Take 10 mg by mouth 2 (two) times daily before a meal.    01/14/2015 at Unknown time  . metFORMIN (GLUCOPHAGE) 1000 MG tablet Take 1,000 mg by mouth 2 (two) times daily.  1 01/14/2015 at Unknown time  . metoprolol succinate (TOPROL-XL) 25 MG 24 hr tablet Take 25 mg by mouth daily.  1 01/14/2015 at 1100a  . naproxen (NAPROSYN) 500 MG tablet Take 1 tablet by mouth 2 (two) times daily as needed for mild pain or moderate pain.   1 Past Week at Unknown time  . Omega-3 Fatty Acids (FISH OIL) 1000 MG CPDR Take 1,000 mg by mouth daily.     01/14/2015 at Unknown time  . omeprazole (PRILOSEC) 20 MG capsule Take 20 mg by mouth every other day.   01/14/2015 at Unknown time    Assessment: 59 YOF who presented with chest pain and SOB that started this AM. Patient found to have large pulmonary emboli with evidence of right heart strain. Transferred from [facility] to [facility] last night. Heparin drip rate increased to 1100 units/hr early this AM.  The 6 hr heparin level has decreased to 0.21 on 1100 units/hr,  SUBtherapeutic heparin level. No bleeding reported. H/H and Pltc remains wnl.  RN reports no complications or interruptions with the heparin infusion and IV site looks appropriate.    Goal of Therapy:  Heparin level 0.3-0.7 units/ml Monitor platelets by anticoagulation protocol: Yes   Plan:  Give 2000 units heparin IV bolus and increase heparin infusion to 1300 units/hr  F/u 6 hr HL Monitor daily HL, CBC and s/s of bleeding  Nicole Cella, RPh Clinical Pharmacist Pager: [phone] 01/16/2015 10:38 AM

## 2015-01-16 NOTE — Progress Notes (Signed)
ANTICOAGULATION CONSULT NOTE - Initial Consult  Pharmacy Consult for heparin  Indication: pulmonary embolus  No Known Allergies  Patient Measurements: Height: 5\' 2"  (157.5 cm) Weight: 245 lb (111.131 kg) IBW/kg (Calculated) : 50.1 Heparin Dosing Weight: 78 kg   Vital Signs: Temp: 98 F (36.7 C) (01/02 1929) Temp Source: Oral (01/02 1929) BP: 139/96 mmHg (01/03 0030) Pulse Rate: 92 (01/03 0030)  Labs:  Recent Labs  01/15/15 1632  HGB 14.6  HCT 44.2  PLT 223  CREATININE 0.95  TROPONINI 0.75*    Estimated Creatinine Clearance: 69.4 mL/min (by C-G formula based on Cr of 0.95).   Medical History: Past Medical History  Diagnosis Date  . Diabetes mellitus   . Hypertension   . Obesity   . Low back pain   . Arthritis     back/saw Dr. Aline Brochure  . Sleep apnea 04/2013  . Cancer ([place])     breast  . Breast cancer ([place])     Medications:  Prescriptions prior to admission  Medication Sig Dispense Refill Last Dose  . anastrozole (ARIMIDEX) 1 MG tablet Take 1 tablet (1 mg total) by mouth daily. 90 tablet 1 01/14/2015 at 1100  . calcium-vitamin D (OSCAL WITH D) 500-200 MG-UNIT per tablet Take 1 tablet by mouth 2 (two) times daily.   01/14/2015 at Unknown time  . glipiZIDE (GLUCOTROL) 10 MG tablet Take 10 mg by mouth 2 (two) times daily before a meal.    01/14/2015 at Unknown time  . metFORMIN (GLUCOPHAGE) 1000 MG tablet Take 1,000 mg by mouth 2 (two) times daily.  1 01/14/2015 at Unknown time  . metoprolol succinate (TOPROL-XL) 25 MG 24 hr tablet Take 25 mg by mouth daily.  1 01/14/2015 at 1100a  . naproxen (NAPROSYN) 500 MG tablet Take 1 tablet by mouth 2 (two) times daily as needed for mild pain or moderate pain.   1 Past Week at Unknown time  . Omega-3 Fatty Acids (FISH OIL) 1000 MG CPDR Take 1,000 mg by mouth daily.    01/14/2015 at Unknown time  . omeprazole (PRILOSEC) 20 MG capsule Take 20 mg by mouth every other day.   01/14/2015 at Unknown time    Assessment: 22 YOF who presented  with chest pain and SOB that started this AM. Patient found to have large pulmonary emboli with evidence of right heart strain. Transferred from [facility] to [facility]. She received a heparin IV bolus of 4000 units and is currently running a heparin infusion at 1000 units/hr since 2041 on 1/2. Anticipate patient is not therapeutic on this regimen. STAT heparin level was low therapeutic at 0.31. H/H and Plt wnl   Goal of Therapy:  Heparin level 0.3-0.7 units/ml Monitor platelets by anticoagulation protocol: Yes   Plan:  Increase heparin infusion to 1100 units/hr  F/u 6 hr HL Monitor daily HL, CBC and s/s of bleeding  Albertina Parr, PharmD., BCPS Clinical Pharmacist Pager [phone]

## 2015-01-16 NOTE — Progress Notes (Signed)
[facility] Progress Note   Alexa Castillo  [id number]  DOB: 10-20-1950  DOA: 01/15/2015 PCP: Geroge Baseman  Brief narrative: Alexa Castillo is a 65 y.o. female with history of breast cancer in the past, diabetes mellitus, hypertension, obstructive sleep apnea who presents to the hospital for sharp chest pains starting on the morning of admission. Imaging in the ER revealed pulmonary emboli with right heart strain and she was started on heparin and admitted to the hospital. Venous duplex of lower extremities was negative for DVT   Subjective: Chest pain has resolved. No complaints of shortness of breath at rest or with exertion-she has been ambulating to the bathroom.  Assessment/Plan: Principal Problem:   Pulmonary embolism- bilateral - submassive -Cardiac strain noted on CT-not a candidate for TPA and has been started on heparin -Troponin noted to be mildly elevated but chest pain resolved-follow 2-D echo - still is tachycardic at rest- resuming Metoprolol to prevent B blocker withdrawal induced tachycardia -Need to assess for home O2 prior to discharge  Active Problems:   Right breast cancer [id number] ER Positive 2012 -Receiving Arimidex which she will need to stop due to PE    Obstructive sleep apnea syndrome in adult - cont CPAP  DM 2 - hold metformin due to CT with contrast - cont SSI  HTN - will resume Metoprolol  Antibiotics: Anti-infectives    None     Code Status:     Code Status Orders        Start     Ordered   01/16/15 0145  Full code   Continuous     01/16/15 0144     Family Communication:   Disposition Plan: home when stable- cont IV Heparin for now DVT prophylaxis: Heparin Consultants:  PCCM Procedures:     Objective: Filed Weights   01/15/15 1612 01/16/15 0151  Weight: 111.131 kg (245 lb) 110.179 kg (242 lb 14.4 oz)    Intake/Output Summary (Last 24 hours) at 01/16/15 1306 Last data filed at 01/16/15 1137  Gross  per 24 hour  Intake    480 ml  Output    550 ml  Net    -70 ml     Vitals Filed Vitals:   01/16/15 0151 01/16/15 0558 01/16/15 0735 01/16/15 1137  BP:  147/94    Pulse:  109    Temp: 98.8 F (37.1 C) 98.4 F (36.9 C) 99 F (37.2 C) 98.3 F (36.8 C)  TempSrc: Oral Oral Oral Oral  Resp:  22    Height: 5\' 2"  (1.575 m)     Weight: 110.179 kg (242 lb 14.4 oz)     SpO2:  93%      Exam:  General:  Pt is alert, not in acute distress  HEENT: No icterus, No thrush, oral mucosa moist  Cardiovascular: regular rate and rhythm, S1/S2 No murmur- tachycardia  Respiratory: clear to auscultation bilaterally   Abdomen: Soft, +Bowel sounds, non tender, non distended, no guarding  MSK: No cyanosis or clubbing- no pedal edema   Data Reviewed: Basic Metabolic Panel:  Recent Labs Lab 01/15/15 1632 01/16/15 0207  NA 143 140  K 3.9 3.6  CL 106 103  CO2 24 25  GLUCOSE 234* 191*  BUN 11 7  CREATININE 0.95 0.85  CALCIUM 9.9 9.4   Liver Function Tests:  Recent Labs Lab 01/15/15 1632  AST 28  ALT 27  ALKPHOS 96  BILITOT 0.5  PROT 7.8  ALBUMIN 3.6   No results  for input(s): LIPASE, AMYLASE in the last 168 hours. No results for input(s): AMMONIA in the last 168 hours. CBC:  Recent Labs Lab 01/15/15 1632 01/16/15 0207  WBC 14.1* 12.3*  NEUTROABS 9.7*  --   HGB 14.6 13.4  HCT 44.2 40.7  MCV 89.7 87.5  PLT 223 255   Cardiac Enzymes:  Recent Labs Lab 01/15/15 1632 01/16/15 0207 01/16/15 0755  TROPONINI 0.75* 0.80* 0.71*   BNP (last 3 results)  Recent Labs  01/15/15 1632  BNP 232.0*    ProBNP (last 3 results) No results for input(s): PROBNP in the last 8760 hours.  CBG:  Recent Labs Lab 01/16/15 0148 01/16/15 0728 01/16/15 1120  GLUCAP 175* 197* 271*    Recent Results (from the past 240 hour(s))  MRSA PCR Screening     Status: None   Collection Time: 01/16/15  1:55 AM  Result Value Ref Range Status   MRSA by PCR NEGATIVE NEGATIVE Final     Comment:        The GeneXpert MRSA Assay (FDA approved for NASAL specimens only), is one component of a comprehensive MRSA colonization surveillance program. It is not intended to diagnose MRSA infection nor to guide or monitor treatment for MRSA infections.      Studies: Dg Chest 2 View  01/15/2015  CLINICAL DATA:  Generalized chest pain and shortness of breath. This was intermittent and began today. History of breast cancer. Former smoker. Worse with deep inspiration. EXAM: CHEST  2 VIEW COMPARISON:  None. FINDINGS: Normal cardiac size. Prominence of the main pulmonary artery on the AP view equal in size to the diameter of the transverse arch could suggest RIGHT heart strain. Consider CT scanning for assessment for pulmonary emboli. No other mediastinal abnormalities. No focal infiltrates or wedge-shaped defects. No evidence for pulmonary edema. No pleural effusion or pneumothorax. Thoracic spondylosis. IMPRESSION: Prominence of the main PICC coronary artery on the AP view could suggest RIGHT heart strain. Recommend CTA chest for further evaluation. Electronically Signed   By: Staci Righter M.D.   On: 01/15/2015 18:57   Ct Angio Chest Pe W/cm &/or Wo Cm  01/15/2015  CLINICAL DATA:  Chest pain and shortness of breath earlier today. Diabetes and hypertension. EXAM: CT ANGIOGRAPHY CHEST WITH CONTRAST TECHNIQUE: Multidetector CT imaging of the chest was performed using the standard protocol during bolus administration of intravenous contrast. Multiplanar CT image reconstructions and MIPs were obtained to evaluate the vascular anatomy. CONTRAST:  146mL OMNIPAQUE IOHEXOL 350 MG/ML SOLN COMPARISON:  Chest radiograph earlier today. FINDINGS: Mediastinum: Extensive clot burden throughout the BILATERAL upper lobe pulmonary arteries, RIGHT greater than LEFT. Significant proximal truncation of the RIGHT upper lobe pulmonary artery as seen on coronal image 66. Lesser amount of clot extends into the BILATERAL  segmental lower lobe pulmonary arteries. Abnormal RV/LV ratio of 1.24, based on chamber measurements of 42 mm RV and 34 mm LV. Mild cardiac enlargement without significant coronary artery calcification. No abnormality of the aorta or great vessels. No mediastinal adenopathy of significance. Normal-appearing esophagus. Lungs/Pleura: No consolidative airspace disease. No pleural effusions. No suspicious appearing pulmonary nodules or masses. Upper Abdomen: Visualized portions of the upper abdomen are unremarkable. Hepatic steatosis. Musculoskeletal: No aggressive appearing lytic or blastic lesions are noted in the visualized portions of the skeleton. Review of the MIP images confirms the above findings. IMPRESSION: Positive for BILATERAL lobar acute PE with CT evidence of right heart strain (RV/LV Ratio = 1.24) consistent with at least submassive (intermediate risk) PE.  The presence of right heart strain has been associated with an increased risk of morbidity and mortality. Please activate Code PE by paging [phone]. These results were called by telephone by myself at the time of interpretation on 01/15/2015 at 7:45 pm to Dr. Milton Ferguson , who verbally acknowledged these results. Electronically Signed   By: Staci Righter M.D.   On: 01/15/2015 19:48   US Venous Img Lower Bilateral  01/15/2015  CLINICAL DATA:  History of pulmonary embolus. EXAM: BILATERAL LOWER EXTREMITY VENOUS DOPPLER ULTRASOUND TECHNIQUE: Gray-scale sonography with graded compression, as well as color Doppler and duplex ultrasound were performed to evaluate the lower extremity deep venous systems from the level of the common femoral vein and including the common femoral, femoral, profunda femoral, popliteal and calf veins including the posterior tibial, peroneal and gastrocnemius veins when visible. The superficial great saphenous vein was also interrogated. Spectral Doppler was utilized to evaluate flow at rest and with distal augmentation  maneuvers in the common femoral, femoral and popliteal veins. COMPARISON:  CT scan of same day. FINDINGS: RIGHT LOWER EXTREMITY Common Femoral Vein: No evidence of thrombus. Normal compressibility, respiratory phasicity and response to augmentation. Saphenofemoral Junction: No evidence of thrombus. Normal compressibility and flow on color Doppler imaging. Profunda Femoral Vein: No evidence of thrombus. Normal compressibility and flow on color Doppler imaging. Femoral Vein: No evidence of thrombus. Normal compressibility, respiratory phasicity and response to augmentation. Popliteal Vein: No evidence of thrombus. Normal compressibility, respiratory phasicity and response to augmentation. Calf Veins: No evidence of thrombus. Normal compressibility and flow on color Doppler imaging. Superficial Great Saphenous Vein: No evidence of thrombus. Normal compressibility and flow on color Doppler imaging. Venous Reflux:  None. Other Findings:  None. LEFT LOWER EXTREMITY Common Femoral Vein: No evidence of thrombus. Normal compressibility, respiratory phasicity and response to augmentation. Saphenofemoral Junction: No evidence of thrombus. Normal compressibility and flow on color Doppler imaging. Profunda Femoral Vein: No evidence of thrombus. Normal compressibility and flow on color Doppler imaging. Femoral Vein: No evidence of thrombus. Normal compressibility, respiratory phasicity and response to augmentation. Popliteal Vein: No evidence of thrombus. Normal compressibility, respiratory phasicity and response to augmentation. Calf Veins: No evidence of thrombus. Normal compressibility and flow on color Doppler imaging. Superficial Great Saphenous Vein: No evidence of thrombus. Normal compressibility and flow on color Doppler imaging. Venous Reflux:  None. Other Findings:  None. IMPRESSION: No evidence of deep venous thrombosis seen in either lower extremity. Electronically Signed   By: Marijo Conception, M.D.   On: 01/15/2015  21:38    Scheduled Meds:  Scheduled Meds: . [START ON 01/17/2015] antiseptic oral rinse  7 mL Mouth Rinse BID  . insulin aspart  0-9 Units Subcutaneous TID WC  . [START ON 01/17/2015] pneumococcal 23 valent vaccine  0.5 mL Intramuscular Tomorrow-1000  . sodium chloride  3 mL Intravenous Q12H   Continuous Infusions: . heparin 1,300 Units/hr (01/16/15 1053)    Time spent on care of this patient: 35 min   [place], MD 01/16/2015, 1:06 PM  LOS: 1 day   [facility] Office  [phone] Pager - Text Page per [email or web address] If 7PM-7AM, please contact night-coverage [email or web address]

## 2015-01-17 LAB — CBC
HEMATOCRIT: 39.4 % (ref 36.0–46.0)
HEMOGLOBIN: 12.7 g/dL (ref 12.0–15.0)
MCH: 28.3 pg (ref 26.0–34.0)
MCHC: 32.2 g/dL (ref 30.0–36.0)
MCV: 87.8 fL (ref 78.0–100.0)
Platelets: 240 10*3/uL (ref 150–400)
RBC: 4.49 MIL/uL (ref 3.87–5.11)
RDW: 14.7 % (ref 11.5–15.5)
WBC: 10.3 10*3/uL (ref 4.0–10.5)

## 2015-01-17 LAB — GLUCOSE, CAPILLARY
GLUCOSE-CAPILLARY: 251 mg/dL — AB (ref 65–99)
GLUCOSE-CAPILLARY: 235 mg/dL — AB (ref 65–99)
Glucose-Capillary: 252 mg/dL — ABNORMAL HIGH (ref 65–99)
Glucose-Capillary: 180 mg/dL — ABNORMAL HIGH (ref 65–99)

## 2015-01-17 LAB — HEPARIN LEVEL (UNFRACTIONATED): Heparin Unfractionated: 0.41 IU/mL (ref 0.30–0.70)

## 2015-01-17 MED ORDER — ANASTROZOLE 1 MG PO TABS
1.0000 mg | ORAL_TABLET | Freq: Every day | ORAL | Status: DC
  Administered 2015-01-18: 1 mg via ORAL
  Filled 2015-01-17 (×2): qty 1

## 2015-01-17 MED ORDER — INSULIN GLARGINE 100 UNIT/ML ~~LOC~~ SOLN
10.0000 [IU] | Freq: Every day | SUBCUTANEOUS | Status: DC
  Administered 2015-01-17 – 2015-01-18 (×2): 10 [IU] via SUBCUTANEOUS
  Filled 2015-01-17 (×4): qty 0.1

## 2015-01-17 NOTE — Progress Notes (Signed)
Pt is on her CPAP at this time, tolerating it well no distress or complications noted

## 2015-01-17 NOTE — Progress Notes (Signed)
SATURATION QUALIFICATIONS: (This note is used to comply with regulatory documentation for home oxygen)  Patient Saturations on Room Air at Rest = 93%  Patient Saturations on Room Air while Ambulating = 85-86%  Patient Saturations on 2 Liters of oxygen while Ambulating = 92-93%  Please briefly explain why patient needs home oxygen:

## 2015-01-17 NOTE — Progress Notes (Signed)
ANTICOAGULATION CONSULT NOTE - Follow Up Consult  Pharmacy Consult for Heparin Indication: PE  No Known Allergies  Patient Measurements: Height: 5\' 2"  (157.5 cm) Weight: 234 lb 12.8 oz (106.505 kg) IBW/kg (Calculated) : 50.1 Heparin Dosing Weight: 78 kg  Vital Signs: Temp: 99 F (37.2 C) (01/04 0739) Temp Source: Oral (01/04 0739) BP: 119/70 mmHg (01/04 0739) Pulse Rate: 94 (01/04 0739)  Labs:  Recent Labs  01/15/15 1632 01/16/15 0207 01/16/15 0755  01/16/15 1350 01/16/15 1510 01/16/15 2127 01/17/15 0308  HGB 14.6 13.4  --   --   --   --   --  12.7  HCT 44.2 40.7  --   --   --   --   --  39.4  PLT 223 255  --   --   --   --   --  240  HEPARINUNFRC  --  0.31  --   < >  --  0.43 0.44 0.41  CREATININE 0.95 0.85  --   --   --   --   --   --   TROPONINI 0.75* 0.80* 0.71*  --  0.64*  --   --   --   < > = values in this interval not displayed.  Estimated Creatinine Clearance: 75.7 mL/min (by C-G formula based on Cr of 0.85).  Assessment: 65 yo F on IV heparin for a large pulmonary emboli with evidence of right heart strain. Heparin level is therapeutic at 0.41 on 1300 units/hr. CBC stable, no s/s of bleed.  Goal of Therapy:  Heparin level 0.3-0.7 units/ml Monitor platelets by anticoagulation protocol: Yes  Plan:  Continue heparin gtt at 1,300 units/hr Monitor daily HL, CBC, s/s of bleed F/u plans for oral anticoagulation.  Maryanna Shape, PharmD, BCPS  Clinical Pharmacist  Pager: [phone]   01/17/2015 8:34 AM

## 2015-01-17 NOTE — Progress Notes (Signed)
Inpatient Diabetes Program Recommendations  AACE/ADA: New Consensus Statement on Inpatient Glycemic Control (2015)  Target Ranges:  Prepandial:   less than 140 mg/dL      Peak postprandial:   less than 180 mg/dL (1-2 hours)      Critically ill patients:  140 - 180 mg/dL   Results for Alexa Castillo, Alexa Castillo (MRN [id number]) as of 01/17/2015 09:51  Ref. Range 01/16/2015 07:28 01/16/2015 11:20 01/16/2015 16:20 01/16/2015 20:57  Glucose-Capillary Latest Ref Range: 65-99 mg/dL 197 (H) 271 (H) 230 (H) 240 (H)    Results for Alexa Castillo, ROPP (MRN [id number]) as of 01/17/2015 09:51  Ref. Range 01/17/2015 07:38  Glucose-Capillary Latest Ref Range: 65-99 mg/dL 251 (H)    Admit Bilateral PEs.   History: DM, OSA, HTN  Home DM Meds: Glipizide 10 mg bid        Metformin 1000 mg bid  Current Orders: Novolog Sensitive SSI (0-9 units) TID AC      MD- Please consider the following in-hospital insulin adjustments while patient's home oral DM medications on hold:  1. Start Lantus 10 units daily (0.1 units/kg dosing)  2. Start Novolog Meal Coverage- Novolog 4 units tidwc     --Will follow patient during hospitalization--  Wyn Quaker RN, MSN, CDE Diabetes Coordinator Inpatient Glycemic Control Team Team Pager: [phone] (8a-5p)

## 2015-01-17 NOTE — Plan of Care (Signed)
Problem: Activity: Goal: Risk for activity intolerance will decrease Outcome: Progressing Patient still sob on exertion, ambulating o2 sats drop to 85-86% on room air

## 2015-01-17 NOTE — Progress Notes (Signed)
[facility] Progress Note   Alexa Castillo  [id number]  DOB: [birth year]  DOA: 01/15/2015 PCP: Alexa Castillo  Brief narrative: Alexa Castillo is a 65 y.o. female with history of breast cancer in the past, diabetes mellitus, hypertension, obstructive sleep apnea who presents to the hospital for sharp chest pains starting on the morning of admission. Imaging in the ER revealed pulmonary emboli with right heart strain and she was started on heparin and admitted to the hospital. Venous duplex of lower extremities was negative for DVT   Subjective: Chest pain has resolved, remains with significant tachycardia and hypoxia on ambulation. Assessment/Plan:    Pulmonary embolism- bilateral - submassive -Cardiac strain noted on CT-not a candidate for TPA and has been started on heparin -Troponin noted to be mildly elevated but chest pain resolved- - still is tachycardic at rest- resuming Metoprolol to prevent B blocker withdrawal induced tachycardia -Hypoxic 85% room air on ambulation, reassess tomorrow. Discharge to see if she is oxygen. - We'll continue with heparin GTT today given she still hypoxic and tachycardic on ambulation, reassess tomorrow if appropriate to resume oral agent ( preferably Xarelto ,as discussed by her oncologist PA), she has a follow-up appointment on 01/29/2015, they will try to arrange for earlier appointment      Right breast cancer [id number] ER Positive 2012 - Discussed with her oncologist PA , they recommend to continue with Arimidex     Obstructive sleep apnea syndrome in adult - cont CPAP  DM 2 - hold metformin due to CT with contrast - cont SSI  HTN - will resume Metoprolol  Antibiotics: Anti-infectives    None     Code Status:     Code Status Orders        Start     Ordered   01/16/15 0145  Full code   Continuous     01/16/15 0144     Family Communication:   Disposition Plan: home when stable- cont IV Heparin for now DVT  prophylaxis: Heparin Consultants:  PCCM Procedures:     Objective: Filed Weights   01/15/15 1612 01/16/15 0151 01/17/15 0600  Weight: 111.131 kg (245 lb) 110.179 kg (242 lb 14.4 oz) 106.505 kg (234 lb 12.8 oz)    Intake/Output Summary (Last 24 hours) at 01/17/15 1324 Last data filed at 01/17/15 1300  Gross per 24 hour  Intake 1721.49 ml  Output    875 ml  Net 846.49 ml     Vitals Filed Vitals:   01/17/15 0600 01/17/15 0739 01/17/15 0857 01/17/15 1117  BP: 122/78 119/70 119/77 116/71  Pulse:  94 102 93  Temp: 98.4 F (36.9 C) 99 F (37.2 C)  100.2 F (37.9 C)  TempSrc: Oral Oral  Oral  Resp:  15  17  Height:      Weight: 106.505 kg (234 lb 12.8 oz)     SpO2: 93% 94%  96%    Exam:  General:  Pt is alert, not in acute distress  HEENT: No icterus, No thrush, oral mucosa moist  Cardiovascular: regular  rhythm, S1/S2 No murmur- tachycardia  Respiratory: clear to auscultation bilaterally   Abdomen: Soft, +Bowel sounds, non tender, non distended, no guarding  MSK: No cyanosis or clubbing- no pedal edema   Data Reviewed: Basic Metabolic Panel:  Recent Labs Lab 01/15/15 1632 01/16/15 0207  NA 143 140  K 3.9 3.6  CL 106 103  CO2 24 25  GLUCOSE 234* 191*  BUN 11 7  CREATININE 0.95  0.85  CALCIUM 9.9 9.4   Liver Function Tests:  Recent Labs Lab 01/15/15 1632  AST 28  ALT 27  ALKPHOS 96  BILITOT 0.5  PROT 7.8  ALBUMIN 3.6   No results for input(s): LIPASE, AMYLASE in the last 168 hours. No results for input(s): AMMONIA in the last 168 hours. CBC:  Recent Labs Lab 01/15/15 1632 01/16/15 0207 01/17/15 0308  WBC 14.1* 12.3* 10.3  NEUTROABS 9.7*  --   --   HGB 14.6 13.4 12.7  HCT 44.2 40.7 39.4  MCV 89.7 87.5 87.8  PLT 223 255 240   Cardiac Enzymes:  Recent Labs Lab 01/15/15 1632 01/16/15 0207 01/16/15 0755 01/16/15 1350  TROPONINI 0.75* 0.80* 0.71* 0.64*   BNP (last 3 results)  Recent Labs  01/15/15 1632  BNP 232.0*     ProBNP (last 3 results) No results for input(s): PROBNP in the last 8760 hours.  CBG:  Recent Labs Lab 01/16/15 1120 01/16/15 1620 01/16/15 2057 01/17/15 0738 01/17/15 1116  GLUCAP 271* 230* 240* 251* 252*    Recent Results (from the past 240 hour(s))  MRSA PCR Screening     Status: None   Collection Time: 01/16/15  1:55 AM  Result Value Ref Range Status   MRSA by PCR NEGATIVE NEGATIVE Final    Comment:        The GeneXpert MRSA Assay (FDA approved for NASAL specimens only), is one component of a comprehensive MRSA colonization surveillance program. It is not intended to diagnose MRSA infection nor to guide or monitor treatment for MRSA infections.      Studies: Dg Chest 2 View  01/15/2015  CLINICAL DATA:  Generalized chest pain and shortness of breath. This was intermittent and began today. History of breast cancer. Former smoker. Worse with deep inspiration. EXAM: CHEST  2 VIEW COMPARISON:  None. FINDINGS: Normal cardiac size. Prominence of the main pulmonary artery on the AP view equal in size to the diameter of the transverse arch could suggest RIGHT heart strain. Consider CT scanning for assessment for pulmonary emboli. No other mediastinal abnormalities. No focal infiltrates or wedge-shaped defects. No evidence for pulmonary edema. No pleural effusion or pneumothorax. Thoracic spondylosis. IMPRESSION: Prominence of the main PICC coronary artery on the AP view could suggest RIGHT heart strain. Recommend CTA chest for further evaluation. Electronically Signed   By: Alexa Castillo M.D.   On: 01/15/2015 18:57   Ct Angio Chest Pe W/cm &/or Wo Cm  01/15/2015  CLINICAL DATA:  Chest pain and shortness of breath earlier today. Diabetes and hypertension. EXAM: CT ANGIOGRAPHY CHEST WITH CONTRAST TECHNIQUE: Multidetector CT imaging of the chest was performed using the standard protocol during bolus administration of intravenous contrast. Multiplanar CT image reconstructions and  MIPs were obtained to evaluate the vascular anatomy. CONTRAST:  123mL OMNIPAQUE IOHEXOL 350 MG/ML SOLN COMPARISON:  Chest radiograph earlier today. FINDINGS: Mediastinum: Extensive clot burden throughout the BILATERAL upper lobe pulmonary arteries, RIGHT greater than LEFT. Significant proximal truncation of the RIGHT upper lobe pulmonary artery as seen on coronal image 66. Lesser amount of clot extends into the BILATERAL segmental lower lobe pulmonary arteries. Abnormal RV/LV ratio of 1.24, based on chamber measurements of 42 mm RV and 34 mm LV. Mild cardiac enlargement without significant coronary artery calcification. No abnormality of the aorta or great vessels. No mediastinal adenopathy of significance. Normal-appearing esophagus. Lungs/Pleura: No consolidative airspace disease. No pleural effusions. No suspicious appearing pulmonary nodules or masses. Upper Abdomen: Visualized portions of the upper  abdomen are unremarkable. Hepatic steatosis. Musculoskeletal: No aggressive appearing lytic or blastic lesions are noted in the visualized portions of the skeleton. Review of the MIP images confirms the above findings. IMPRESSION: Positive for BILATERAL lobar acute PE with CT evidence of right heart strain (RV/LV Ratio = 1.24) consistent with at least submassive (intermediate risk) PE. The presence of right heart strain has been associated with an increased risk of morbidity and mortality. Please activate Code PE by paging [phone]. These results were called by telephone by myself at the time of interpretation on 01/15/2015 at 7:45 pm to Dr. Milton Ferguson , who verbally acknowledged these results. Electronically Signed   By: Alexa Castillo M.D.   On: 01/15/2015 19:48   US Venous Img Lower Bilateral  01/15/2015  CLINICAL DATA:  History of pulmonary embolus. EXAM: BILATERAL LOWER EXTREMITY VENOUS DOPPLER ULTRASOUND TECHNIQUE: Gray-scale sonography with graded compression, as well as color Doppler and duplex  ultrasound were performed to evaluate the lower extremity deep venous systems from the level of the common femoral vein and including the common femoral, femoral, profunda femoral, popliteal and calf veins including the posterior tibial, peroneal and gastrocnemius veins when visible. The superficial great saphenous vein was also interrogated. Spectral Doppler was utilized to evaluate flow at rest and with distal augmentation maneuvers in the common femoral, femoral and popliteal veins. COMPARISON:  CT scan of same day. FINDINGS: RIGHT LOWER EXTREMITY Common Femoral Vein: No evidence of thrombus. Normal compressibility, respiratory phasicity and response to augmentation. Saphenofemoral Junction: No evidence of thrombus. Normal compressibility and flow on color Doppler imaging. Profunda Femoral Vein: No evidence of thrombus. Normal compressibility and flow on color Doppler imaging. Femoral Vein: No evidence of thrombus. Normal compressibility, respiratory phasicity and response to augmentation. Popliteal Vein: No evidence of thrombus. Normal compressibility, respiratory phasicity and response to augmentation. Calf Veins: No evidence of thrombus. Normal compressibility and flow on color Doppler imaging. Superficial Great Saphenous Vein: No evidence of thrombus. Normal compressibility and flow on color Doppler imaging. Venous Reflux:  None. Other Findings:  None. LEFT LOWER EXTREMITY Common Femoral Vein: No evidence of thrombus. Normal compressibility, respiratory phasicity and response to augmentation. Saphenofemoral Junction: No evidence of thrombus. Normal compressibility and flow on color Doppler imaging. Profunda Femoral Vein: No evidence of thrombus. Normal compressibility and flow on color Doppler imaging. Femoral Vein: No evidence of thrombus. Normal compressibility, respiratory phasicity and response to augmentation. Popliteal Vein: No evidence of thrombus. Normal compressibility, respiratory phasicity and  response to augmentation. Calf Veins: No evidence of thrombus. Normal compressibility and flow on color Doppler imaging. Superficial Great Saphenous Vein: No evidence of thrombus. Normal compressibility and flow on color Doppler imaging. Venous Reflux:  None. Other Findings:  None. IMPRESSION: No evidence of deep venous thrombosis seen in either lower extremity. Electronically Signed   By: Marijo Conception, M.D.   On: 01/15/2015 21:38    Scheduled Meds:  Scheduled Meds: . antiseptic oral rinse  7 mL Mouth Rinse BID  . insulin aspart  0-9 Units Subcutaneous TID WC  . insulin glargine  10 Units Subcutaneous Daily  . metoprolol succinate  25 mg Oral Daily  . pneumococcal 23 valent vaccine  0.5 mL Intramuscular Tomorrow-1000  . sodium chloride  3 mL Intravenous Q12H   Continuous Infusions: . heparin 1,300 Units/hr (01/16/15 1053)    Time spent on care of this patient: 30 minutes   Meah Jiron, [place] 01/17/2015, 1:24 PM  LOS: 2 days   [facility] Office  [phone] Pager - Text Page per [email or web address] If 7PM-7AM, please contact night-coverage [email or web address]

## 2015-01-18 DIAGNOSIS — I2699 Other pulmonary embolism without acute cor pulmonale: Secondary | ICD-10-CM | POA: Diagnosis not present

## 2015-01-18 DIAGNOSIS — I2782 Chronic pulmonary embolism: Secondary | ICD-10-CM

## 2015-01-18 LAB — CBC
HEMATOCRIT: 39.7 % (ref 36.0–46.0)
HEMOGLOBIN: 12.9 g/dL (ref 12.0–15.0)
MCH: 29.1 pg (ref 26.0–34.0)
MCHC: 32.5 g/dL (ref 30.0–36.0)
MCV: 89.4 fL (ref 78.0–100.0)
Platelets: 254 10*3/uL (ref 150–400)
RBC: 4.44 MIL/uL (ref 3.87–5.11)
RDW: 15 % (ref 11.5–15.5)
WBC: 10.4 10*3/uL (ref 4.0–10.5)

## 2015-01-18 LAB — GLUCOSE, CAPILLARY
Glucose-Capillary: 234 mg/dL — ABNORMAL HIGH (ref 65–99)
Glucose-Capillary: 283 mg/dL — ABNORMAL HIGH (ref 65–99)

## 2015-01-18 LAB — HEPARIN LEVEL (UNFRACTIONATED): Heparin Unfractionated: 0.55 IU/mL (ref 0.30–0.70)

## 2015-01-18 MED ORDER — RIVAROXABAN (XARELTO) EDUCATION KIT FOR DVT/PE PATIENTS
PACK | Freq: Once | Status: DC
  Filled 2015-01-18 (×2): qty 1

## 2015-01-18 MED ORDER — RIVAROXABAN (XARELTO) VTE STARTER PACK (15 & 20 MG): ORAL_TABLET | ORAL | Status: DC

## 2015-01-18 MED ORDER — RIVAROXABAN 15 MG PO TABS
15.0000 mg | ORAL_TABLET | Freq: Once | ORAL | Status: AC
  Administered 2015-01-18: 15 mg via ORAL
  Filled 2015-01-18: qty 1

## 2015-01-18 MED ORDER — RIVAROXABAN 15 MG PO TABS: 15.0000 mg | ORAL_TABLET | Freq: Two times a day (BID) | ORAL | Status: DC

## 2015-01-18 MED ORDER — RIVAROXABAN 20 MG PO TABS: 20.0000 mg | ORAL_TABLET | Freq: Every day | ORAL | Status: DC

## 2015-01-18 NOTE — Discharge Instructions (Signed)
Follow with Primary MD CLAGGETT,ELIN, PA-C in 7 days   Get CBC, CMP,checked  by Primary MD next visit.    Activity: As tolerated with Full fall precautions use walker/cane & assistance as needed   Disposition Home    Diet: Heart Healthy , carbohydrate modified , with feeding assistance and aspiration precautions.  For Heart failure patients - Check your Weight same time everyday, if you gain over 2 pounds, or you develop in leg swelling, experience more shortness of breath or chest pain, call your Primary MD immediately. Follow Cardiac Low Salt Diet and 1.5 lit/day fluid restriction.   On your next visit with your primary care physician please Get Medicines reviewed and adjusted.   Please request your Prim.MD to go over all Hospital Tests and Procedure/Radiological results at the follow up, please get all Hospital records sent to your Prim MD by signing hospital release before you go home.   If you experience worsening of your admission symptoms, develop shortness of breath, life threatening emergency, suicidal or homicidal thoughts you must seek medical attention immediately by calling 911 or calling your MD immediately  if symptoms less severe.  You Must read complete instructions/literature along with all the possible adverse reactions/side effects for all the Medicines you take and that have been prescribed to you. Take any new Medicines after you have completely understood and accpet all the possible adverse reactions/side effects.   Do not drive, operating heavy machinery, perform activities at heights, swimming or participation in water activities or provide baby sitting services if your were admitted for syncope or siezures until you have seen by Primary MD or a Neurologist and advised to do so again.  Do not drive when taking Pain medications.    Do not take more than prescribed Pain, Sleep and Anxiety Medications  Special Instructions: If you have smoked or chewed Tobacco   in the last 2 yrs please stop smoking, stop any regular Alcohol  and or any Recreational drug use.  Wear Seat belts while driving.   Please note  You were cared for by a hospitalist during your hospital stay. If you have any questions about your discharge medications or the care you received while you were in the hospital after you are discharged, you can call the unit and asked to speak with the hospitalist on call if the hospitalist that took care of you is not available. Once you are discharged, your primary care physician will handle any further medical issues. Please note that NO REFILLS for any discharge medications will be authorized once you are discharged, as it is imperative that you return to your primary care physician (or establish a relationship with a primary care physician if you do not have one) for your aftercare needs so that they can reassess your need for medications and monitor your lab values.  Information on my medicine - XARELTO (rivaroxaban)  This medication education was reviewed with me or my healthcare representative as part of my discharge preparation.   WHY WAS XARELTO PRESCRIBED FOR YOU? Xarelto was prescribed to treat blood clots that may have been found in the veins of your legs (deep vein thrombosis) or in your lungs (pulmonary embolism) and to reduce the risk of them occurring again.  What do you need to know about Xarelto? The starting dose is one 15 mg tablet taken TWICE daily with food for the FIRST 21 DAYS then on 02/08/2015  the dose is changed to one 20 mg tablet taken ONCE A  DAY with your evening meal.  DO NOT stop taking Xarelto without talking to the health care provider who prescribed the medication.  Refill your prescription for 20 mg tablets before you run out.  After discharge, you should have regular check-up appointments with your healthcare provider that is prescribing your Xarelto.  In the future your dose may need to be changed if your  kidney function changes by a significant amount.  What do you do if you miss a dose? If you are taking Xarelto TWICE DAILY and you miss a dose, take it as soon as you remember. You may take two 15 mg tablets (total 30 mg) at the same time then resume your regularly scheduled 15 mg twice daily the next day.  If you are taking Xarelto ONCE DAILY and you miss a dose, take it as soon as you remember on the same day then continue your regularly scheduled once daily regimen the next day. Do not take two doses of Xarelto at the same time.   Important Safety Information Xarelto is a blood thinner medicine that can cause bleeding. You should call your healthcare provider right away if you experience any of the following: ? Bleeding from an injury or your nose that does not stop. ? Unusual colored urine (red or dark brown) or unusual colored stools (red or black). ? Unusual bruising for unknown reasons. ? A serious fall or if you hit your head (even if there is no bleeding).  Some medicines may interact with Xarelto and might increase your risk of bleeding while on Xarelto. To help avoid this, consult your healthcare provider or pharmacist prior to using any new prescription or non-prescription medications, including herbals, vitamins, non-steroidal anti-inflammatory drugs (NSAIDs) and supplements.  This website has more information on Xarelto: [email or web address] Pulmonary Embolism A pulmonary embolism (PE) is a sudden blockage or decrease of blood flow in one lung or both lungs. Most blockages come from a blood clot that travels from the legs or the pelvis to the lungs. PE is a dangerous and potentially life-threatening condition if it is not treated right away. CAUSES A pulmonary embolism occurs most commonly when a blood clot travels from one of your veins to your lungs. Rarely, PE is caused by air, fat, amniotic fluid, or part of a tumor traveling through your veins to your lungs. RISK FACTORS A  PE is more likely to develop in:  People who smoke.  People who areolder, especially over 86 years of age.  People who are overweight (obese).  People who sit or lie still for a long time, such as during long-distance travel (over 4 hours), bed rest, hospitalization, or during recovery from certain medical conditions like a stroke.  People who do not engage in much physical activity (sedentary lifestyle).  People who have chronic breathing disorders.  People whohave a personal or family history of blood clots or blood clotting disease.  People whohave peripheral vascular disease (PVD), diabetes, or some types of cancer.  People who haveheart disease, especially if the person had a recent heart attack or has congestive heart failure.  People who have neurological diseases that affect the legs (leg paresis).  People who have had a traumatic injury, such as breaking a hip or leg.  People whohave recently had major or lengthy surgery, especially on the hip, knee, or abdomen.  People who have hada central line placed inside a large vein.  People who takemedicines that contain the hormone estrogen. These include birth control pills  and hormone replacement therapy.  Pregnancy or during childbirth or the postpartum period. SIGNS AND SYMPTOMS  The symptoms of a PE usually start suddenly and include:  Shortness of breath while active or at rest.  Coughing or coughing up blood or blood-tinged mucus.  Chest pain that is often worse with deep breaths.  Rapid or irregular heartbeat.  Feeling light-headed or dizzy.  Fainting.  Feelinganxious.  Sweating. There may also be pain and swelling in a leg if that is where the blood clot started. These symptoms may represent a serious problem that is an emergency. Do not wait to see if the symptoms will go away. Get medical help right away. Call your local emergency services (911 in the [place] Do not drive yourself to the  hospital. DIAGNOSIS Your health care provider will take a medical history and perform a physical exam. You may also have other tests, including:  Blood tests to assess the clotting properties of your blood, assess oxygen levels in your blood, and find blood clots.  Imaging tests, such as CT, ultrasound, MRI, X-ray, and other tests to see if you have clots anywhere in your body.  An electrocardiogram (ECG) to look for heart strain from blood clots in the lungs. TREATMENT The main goals of PE treatment are:  To stop a blood clot from growing larger.  To stop new blood clots from forming. The type of treatment that you receive depends on many factors, such as the cause of your PE, your risk for bleeding or developing more clots, and other medical conditions that you have. Sometimes, a combination of treatments is necessary. This condition may be treated with:  Medicines, including newer oral blood thinners (anticoagulants), warfarin, low molecular weight heparins, thrombolytics, or heparins.  Wearing compression stockings or using different types of devices.  Surgery (rare) to remove the blood clot or to place a filter in your abdomen to stop the blood clot from traveling to your lungs. Treatments for a PE are often divided into immediate treatment, long-term treatment (up to 3 months after PE), and extended treatment (more than 3 months after PE). Your treatment may continue for several months. This is called maintenance therapy, and it is used to prevent the forming of new blood clots. You can work with your health care provider to choose the treatment program that is best for you. What are anticoagulants? Anticoagulants are medicines that treat PEs. They can stop current blood clots from growing and stop new clots from forming. They cannot dissolve existing clots. Your body dissolves clots by itself over time. Anticoagulants are given by mouth, by injection, or through an IV tube. What are  thrombolytics? Thrombolytics are clot-dissolving medicines that are used to dissolve a PE. They carry a high risk of bleeding, so they tend to be used only in severe cases or if you have very low blood pressure. HOME CARE INSTRUCTIONS If you are taking a newer oral anticoagulant:  Take the medicine every single day at the same time each day.  Understand what foods and drugs interact with this medicine.  Understand that there are no regular blood tests required when using this medicine.  Understandthe side effects of this medicine, including excessive bruising or bleeding. Ask your health care provider or pharmacist about other possible side effects. If you are taking warfarin:  Understand how to take warfarin and know which foods can affect how warfarin works in Veterinary surgeon.  Understand that it is dangerous to taketoo much or too little  warfarin. Too much warfarin increases the risk of bleeding. Too little warfarin continues to allow the risk for blood clots.  Follow your PT and INR blood testing schedule. The PT and INR results allow your health care provider to adjust your dose of warfarin. It is very important that you have your PT and INR tested as often as told by your health care provider.  Avoid major changes in your diet, or tell your health care provider before you change your diet. Arrange a visit with a registered dietitian to answer your questions. Many foods, especially foods that are high in vitamin K, can interfere with warfarin and affect the PT and INR results. Eat a consistent amount of foods that are high in vitamin K, such as:  Spinach, kale, broccoli, cabbage, collard greens, turnip greens, Brussels sprouts, peas, cauliflower, seaweed, and parsley.  Beef liver and pork liver.  Green tea.  Soybean oil.  Tell your health care provider about any and all medicines, vitamins, and supplements that you take, including aspirin and other over-the-counter anti-inflammatory  medicines. Be especially cautious with aspirin and anti-inflammatory medicines. Do not take those before you ask your health care provider if it is safe to do so. This is important because many medicines can interfere with warfarin and affect the PT and INR results.  Do not start or stop taking any over-the-counter or prescription medicine unless your health care provider or pharmacist tells you to do so. If you take warfarin, you will also need to do these things:  Hold pressure over cuts for longer than usual.  Tell your dentist and other health care providers that you are taking warfarin before you have any procedures in which bleeding may occur.  Avoid alcohol or drink very small amounts. Tell your health care provider if you change your alcohol intake.  Do not use tobacco products, including cigarettes, chewing tobacco, and e-cigarettes. If you need help quitting, ask your health care provider.  Avoid contact sports. General Instructions  Take over-the-counter and prescription medicines only as told by your health care provider. Anticoagulant medicines can have side effects, including easy bruising and difficulty stopping bleeding. If you are prescribed an anticoagulant, you will also need to do these things:  Hold pressure over cuts for longer than usual.  Tell your dentist and other health care providers that you are taking anticoagulants before you have any procedures in which bleeding may occur.  Avoid contact sports.  Wear a medical alert bracelet or carry a medical alert card that says you have had a PE.  Ask your health care provider how soon you can go back to your normal activities. Stay active to prevent new blood clots from forming.  Make sure to exercise while traveling or when you have been sitting or standing for a long period of time. It is very important to exercise. Exercise your legs by walking or by tightening and relaxing your leg muscles often. Take frequent  walks.  Wear compression stockings as told by your health care provider to help prevent more blood clots from forming.  Do not use tobacco products, including cigarettes, chewing tobacco, and e-cigarettes. If you need help quitting, ask your health care provider.  Keep all follow-up appointments with your health care provider. This is important. PREVENTION Take these actions to decrease your risk of developing another PE:  Exercise regularly. For at least 30 minutes every day, engage in:  Activity that involves moving your arms and legs.  Activity that encourages  good blood flow through your body by increasing your heart rate.  Exercise your arms and legs every hour during long-distance travel (over 4 hours). Drink plenty of water and avoid drinking alcohol while traveling.  Avoid sitting or lying in bed for long periods of time without moving your legs.  Maintain a weight that is appropriate for your height. Ask your health care provider what weight is healthy for you.  If you are a woman who is over 27 years of age, avoid unnecessary use of medicines that contain estrogen. These include birth control pills.  Do not smoke, especially if you take estrogen medicines. If you need help quitting, ask your health care provider.  If you are at very high risk for PE, wear compression stockings.  If you recently had a PE, have regularly scheduled ultrasound testing on your legs to check for new blood clots. If you are hospitalized, prevention measures may include:  Early walking after surgery, as soon as your health care provider says that it is safe.  Receiving anticoagulants to prevent blood clots. If you cannot take anticoagulants, other options may be available, such as wearing compression stockings or using different types of devices. SEEK IMMEDIATE MEDICAL CARE IF:  You have new or increased pain, swelling, or redness in an arm or leg.  You have numbness or tingling in an arm or  leg.  You have shortness of breath while active or at rest.  You have chest pain.  You have a rapid or irregular heartbeat.  You feel light-headed or dizzy.  You cough up blood.  You notice blood in your vomit, bowel movement, or urine.  You have a fever. These symptoms may represent a serious problem that is an emergency. Do not wait to see if the symptoms will go away. Get medical help right away. Call your local emergency services (911 in the [place] Do not drive yourself to the hospital.   This information is not intended to replace advice given to you by your health care provider. Make sure you discuss any questions you have with your health care provider.   Document Released: 12/28/1999 Document Revised: 09/20/2014 Document Reviewed: 04/26/2014 Elsevier Interactive Patient Education [employer].

## 2015-01-18 NOTE — Discharge Summary (Signed)
DORENA LAZZARA, is a 65 y.o. female  DOB January 15, 1950  MRN [id number].  Admission date:  01/15/2015  Admitting Physician  Phillips Grout, MD  Discharge Date:  01/18/2015   Primary MD  Geroge Baseman  Recommendations for primary care physician for things to follow:  - Please check CBC, BMP during next visit. - Patient will be discharged on Xarelto, given prescription for 30 days with no refills, she will apply for financial assistance company, if not approved she will need to be transitioned to warfarin .   Admission Diagnosis  Arthritis [M19.90] PE (pulmonary thromboembolism) ([place]) [I26.99] Essential hypertension [I10]   Discharge Diagnosis  Arthritis [M19.90] PE (pulmonary thromboembolism) ([place]) [I26.99] Essential hypertension [I10]    Principal Problem:   Pulmonary embolism (HCC) Active Problems:   Right breast cancer T1bN0M0 ER Positive 2012   Obstructive sleep apnea syndrome in adult   Arthritis   Hypertension   Chest pain on breathing   Dyspnea on exertion   Troponin level elevated      Past Medical History  Diagnosis Date  . Diabetes mellitus   . Hypertension   . Obesity   . Low back pain   . Arthritis     back/saw Dr. Aline Brochure  . Sleep apnea 04/2013  . Cancer [facility])     breast  . Breast cancer [facility])     Past Surgical History  Procedure Laterality Date  . Tubal ligation    . Biopsy breast         History of present illness and  Hospital Course:     Kindly see H&P for history of present illness and admission details, please review complete Labs, Consult reports and Test reports for all details in brief  HPI  from the history and physical done on the day of admission 01/15/2015 65 yo female h/o breast cancer in remission, OSA, DM, HTN comes in with chest pain and sob that started this am after waking up. She reports early this am, she started have this pain in her  chest throughout the day she has progressively gotten more sob with exertion and the chest pain was getting worse particularly with deep breathing. No cough. No fevers. No le edema, pain or swelling. No recent traveling, no recent surgery or injuries or trauma. No previous h/o VTE. She is feeling better right now in the ED, while resting denies any chest pain or sob. Pt found to have large pulmonary emboli with evidence of right heart strain. PCCM called at [facility], did not think patient was candidate for direct thrombolytics at this time and recommended [place] stepdown admission. Pt referred for admission for PE now on heparin drip. All of pt cancer screening is up to date per her report.   [place] is a 65 y.o. female with history of breast cancer in the past, diabetes mellitus, hypertension, obstructive sleep apnea who presents to the hospital for sharp chest pains starting on the morning of admission. Imaging in the  ER revealed pulmonary emboli with right heart strain and she was started on heparin and admitted to the hospital. Venous duplex of lower extremities was negative for DVT  Pulmonary embolism- bilateral - submassive -Cardiac strain noted on CT-not a candidate for TPA and has been started on heparin, patient has been transitioned to Xarelto on discharge. -Troponin noted to be mildly elevated this is related to her PE. - Patient remains hypoxic on exertion, saturating 85% on room air on ambulation, she will be discharged on home oxygen. - she has a follow-up appointment on 01/29/2015 her oncologist Dr. Whitney Muse, discussed with her PA, they will try to arrange for earlier appointment   Acute hypoxic respiratory failure - Secondary to PE, she will be discharged on home oxygen   Right breast cancer T1bN0M0 ER Positive 2012 - Discussed with her oncologist PA , they recommend to continue with Arimidex   Obstructive sleep apnea syndrome in adult -  cont CPAP  DM 2 - Resume home medication on discharge   HTN - will resume Metoprolol    Discharge Condition:  Stable   Follow UP  Follow-up Information    Follow up with [facility].   Why:  Oxygen   Contact information:   [place] [id number] [phone]       Follow up with CLAGGETT,ELIN, PA-C. Schedule an appointment as soon as possible for a visit in 1 week.   Specialty:  Family Medicine   Why:  Posthospitalization follow-up   Contact information:   [address] [place] [address] [phone]         Discharge Instructions  and  Discharge Medications     Discharge Instructions    Discharge instructions    Complete by:  As directed   Follow with Primary MD CLAGGETT,ELIN, PA-C in 7 days   Get CBC, CMP,checked  by Primary MD next visit.    Activity: As tolerated with Full fall precautions use walker/cane & assistance as needed   Disposition Home    Diet: Heart Healthy , carbohydrate modified , with feeding assistance and aspiration precautions.  For Heart failure patients - Check your Weight same time everyday, if you gain over 2 pounds, or you develop in leg swelling, experience more shortness of breath or chest pain, call your Primary MD immediately. Follow Cardiac Low Salt Diet and 1.5 lit/day fluid restriction.   On your next visit with your primary care physician please Get Medicines reviewed and adjusted.   Please request your Prim.MD to go over all Hospital Tests and Procedure/Radiological results at the follow up, please get all Hospital records sent to your Prim MD by signing hospital release before you go home.   If you experience worsening of your admission symptoms, develop shortness of breath, life threatening emergency, suicidal or homicidal thoughts you must seek medical attention immediately by calling 911 or calling your MD immediately  if symptoms less severe.  You Must read complete  instructions/literature along with all the possible adverse reactions/side effects for all the Medicines you take and that have been prescribed to you. Take any new Medicines after you have completely understood and accpet all the possible adverse reactions/side effects.   Do not drive, operating heavy machinery, perform activities at heights, swimming or participation in water activities or provide baby sitting services if your were admitted for syncope or siezures until you have seen by Primary MD or a Neurologist and advised to do so again.  Do not drive when taking Pain medications.    Do not take more than prescribed Pain, Sleep and Anxiety Medications  Special Instructions: If you have smoked or chewed Tobacco  in the last 2 yrs please stop smoking, stop any regular Alcohol  and or any Recreational drug use.  Wear Seat belts while driving.   Please note  You were cared for by a hospitalist during your hospital stay. If you have any questions about your discharge medications or the care you received while you were in the hospital after you are discharged, you can call the unit and asked to speak with the hospitalist on call if the hospitalist that took care of you is not available. Once you are discharged, your primary care physician will handle any further medical issues. Please note that NO REFILLS for any discharge medications will be authorized once you are discharged, as it is imperative that you return to your primary care physician (or establish a relationship with a primary care physician if you do not have one) for your aftercare needs so that they can reassess your need for medications and monitor your lab values.     Increase activity slowly    Complete by:  As directed             Medication List    STOP taking these medications        naproxen 500 MG tablet  Commonly known as:  NAPROSYN      TAKE these medications        anastrozole 1 MG tablet  Commonly known as:   ARIMIDEX  Take 1 tablet (1 mg total) by mouth daily.     calcium-vitamin D 500-200 MG-UNIT tablet  Commonly known as:  OSCAL WITH D  Take 1 tablet by mouth 2 (two) times daily.     Fish Oil 1000 MG Cpdr  Take 1,000 mg by mouth daily.     glipiZIDE 10 MG tablet  Commonly known as:  GLUCOTROL  Take 10 mg by mouth 2 (two) times daily before a meal.     metFORMIN 1000 MG tablet  Commonly known as:  GLUCOPHAGE  Take 1,000 mg by mouth 2 (two) times daily.     metoprolol succinate 25 MG 24 hr tablet  Commonly known as:  TOPROL-XL  Take 25 mg by mouth daily.     omeprazole 20 MG capsule  Commonly known as:  PRILOSEC  Take 20 mg by mouth every other day.     Rivaroxaban 15 & 20 MG Tbpk  Commonly known as:  XARELTO STARTER PACK  Take as directed on package: Start with one 15mg  tablet by mouth twice a day with food. On Day 22, switch to one 20mg  tablet once a day with food.  Start taking on:  01/19/2015          Diet and Activity recommendation: See Discharge Instructions above   Consults obtained -  PCCM   Major procedures and Radiology Reports - PLEASE review detailed and final reports for all details, in brief -      Dg Chest 2 View  01/15/2015  CLINICAL DATA:  Generalized chest pain and shortness of breath. This was intermittent and began today. History of breast cancer. Former smoker. Worse with deep inspiration. EXAM: CHEST  2 VIEW COMPARISON:  None. FINDINGS: Normal cardiac size. Prominence of the main pulmonary artery on the AP view equal in size to the diameter of the transverse arch could suggest RIGHT heart  strain. Consider CT scanning for assessment for pulmonary emboli. No other mediastinal abnormalities. No focal infiltrates or wedge-shaped defects. No evidence for pulmonary edema. No pleural effusion or pneumothorax. Thoracic spondylosis. IMPRESSION: Prominence of the main PICC coronary artery on the AP view could suggest RIGHT heart strain. Recommend CTA chest for  further evaluation. Electronically Signed   By: Staci Righter M.D.   On: 01/15/2015 18:57   Ct Angio Chest Pe W/cm &/or Wo Cm  01/15/2015  CLINICAL DATA:  Chest pain and shortness of breath earlier today. Diabetes and hypertension. EXAM: CT ANGIOGRAPHY CHEST WITH CONTRAST TECHNIQUE: Multidetector CT imaging of the chest was performed using the standard protocol during bolus administration of intravenous contrast. Multiplanar CT image reconstructions and MIPs were obtained to evaluate the vascular anatomy. CONTRAST:  185mL OMNIPAQUE IOHEXOL 350 MG/ML SOLN COMPARISON:  Chest radiograph earlier today. FINDINGS: Mediastinum: Extensive clot burden throughout the BILATERAL upper lobe pulmonary arteries, RIGHT greater than LEFT. Significant proximal truncation of the RIGHT upper lobe pulmonary artery as seen on coronal image 66. Lesser amount of clot extends into the BILATERAL segmental lower lobe pulmonary arteries. Abnormal RV/LV ratio of 1.24, based on chamber measurements of 42 mm RV and 34 mm LV. Mild cardiac enlargement without significant coronary artery calcification. No abnormality of the aorta or great vessels. No mediastinal adenopathy of significance. Normal-appearing esophagus. Lungs/Pleura: No consolidative airspace disease. No pleural effusions. No suspicious appearing pulmonary nodules or masses. Upper Abdomen: Visualized portions of the upper abdomen are unremarkable. Hepatic steatosis. Musculoskeletal: No aggressive appearing lytic or blastic lesions are noted in the visualized portions of the skeleton. Review of the MIP images confirms the above findings. IMPRESSION: Positive for BILATERAL lobar acute PE with CT evidence of right heart strain (RV/LV Ratio = 1.24) consistent with at least submassive (intermediate risk) PE. The presence of right heart strain has been associated with an increased risk of morbidity and mortality. Please activate Code PE by paging [phone]. These results were called by  telephone by myself at the time of interpretation on 01/15/2015 at 7:45 pm to Dr. Milton Ferguson , who verbally acknowledged these results. Electronically Signed   By: Staci Righter M.D.   On: 01/15/2015 19:48   US Venous Img Lower Bilateral  01/15/2015  CLINICAL DATA:  History of pulmonary embolus. EXAM: BILATERAL LOWER EXTREMITY VENOUS DOPPLER ULTRASOUND TECHNIQUE: Gray-scale sonography with graded compression, as well as color Doppler and duplex ultrasound were performed to evaluate the lower extremity deep venous systems from the level of the common femoral vein and including the common femoral, femoral, profunda femoral, popliteal and calf veins including the posterior tibial, peroneal and gastrocnemius veins when visible. The superficial great saphenous vein was also interrogated. Spectral Doppler was utilized to evaluate flow at rest and with distal augmentation maneuvers in the common femoral, femoral and popliteal veins. COMPARISON:  CT scan of same day. FINDINGS: RIGHT LOWER EXTREMITY Common Femoral Vein: No evidence of thrombus. Normal compressibility, respiratory phasicity and response to augmentation. Saphenofemoral Junction: No evidence of thrombus. Normal compressibility and flow on color Doppler imaging. Profunda Femoral Vein: No evidence of thrombus. Normal compressibility and flow on color Doppler imaging. Femoral Vein: No evidence of thrombus. Normal compressibility, respiratory phasicity and response to augmentation. Popliteal Vein: No evidence of thrombus. Normal compressibility, respiratory phasicity and response to augmentation. Calf Veins: No evidence of thrombus. Normal compressibility and flow on color Doppler imaging. Superficial Great Saphenous Vein: No evidence of thrombus. Normal compressibility and flow on color Doppler imaging.  Venous Reflux:  None. Other Findings:  None. LEFT LOWER EXTREMITY Common Femoral Vein: No evidence of thrombus. Normal compressibility, respiratory phasicity  and response to augmentation. Saphenofemoral Junction: No evidence of thrombus. Normal compressibility and flow on color Doppler imaging. Profunda Femoral Vein: No evidence of thrombus. Normal compressibility and flow on color Doppler imaging. Femoral Vein: No evidence of thrombus. Normal compressibility, respiratory phasicity and response to augmentation. Popliteal Vein: No evidence of thrombus. Normal compressibility, respiratory phasicity and response to augmentation. Calf Veins: No evidence of thrombus. Normal compressibility and flow on color Doppler imaging. Superficial Great Saphenous Vein: No evidence of thrombus. Normal compressibility and flow on color Doppler imaging. Venous Reflux:  None. Other Findings:  None. IMPRESSION: No evidence of deep venous thrombosis seen in either lower extremity. Electronically Signed   By: Marijo Conception, M.D.   On: 01/15/2015 21:38    Micro Results     Recent Results (from the past 240 hour(s))  MRSA PCR Screening     Status: None   Collection Time: 01/16/15  1:55 AM  Result Value Ref Range Status   MRSA by PCR NEGATIVE NEGATIVE Final    Comment:        The GeneXpert MRSA Assay (FDA approved for NASAL specimens only), is one component of a comprehensive MRSA colonization surveillance program. It is not intended to diagnose MRSA infection nor to guide or monitor treatment for MRSA infections.        Today   Subjective:   Briceyda Wilda today has no headache,no chest abdominal pain,no new weakness tingling or numbness, feels much better wants to go home today.   Objective:   Blood pressure 104/71, pulse 89, temperature 98.6 F (37 C), temperature source Oral, resp. rate 16, height 5\' 2"  (1.575 m), weight 106.505 kg (234 lb 12.8 oz), SpO2 95 %.   Intake/Output Summary (Last 24 hours) at 01/18/15 1348 Last data filed at 01/18/15 0629  Gross per 24 hour  Intake 790.05 ml  Output    525 ml  Net 265.05 ml    Exam Awake Alert,  Oriented x 3, No new F.N deficits, Normal affect .AT,PERRAL Supple Neck,No JVD, No cervical lymphadenopathy appriciated.  Symmetrical Chest wall movement, Good air movement bilaterally, CTAB RRR,No Gallops,Rubs or new Murmurs, No Parasternal Heave +ve B.Sounds, Abd Soft, Non tender, No organomegaly appriciated, No rebound -guarding or rigidity. No Cyanosis, Clubbing or edema, No new Rash or bruise  Data Review   CBC w Diff: Lab Results  Component Value Date   WBC 10.4 01/18/2015   HGB 12.9 01/18/2015   HCT 39.7 01/18/2015   PLT 254 01/18/2015   LYMPHOPCT 23 01/15/2015   MONOPCT 6 01/15/2015   EOSPCT 1 01/15/2015   BASOPCT 0 01/15/2015    CMP: Lab Results  Component Value Date   NA 140 01/16/2015   K 3.6 01/16/2015   CL 103 01/16/2015   CO2 25 01/16/2015   BUN 7 01/16/2015   CREATININE 0.85 01/16/2015   PROT 7.8 01/15/2015   ALBUMIN 3.6 01/15/2015   BILITOT 0.5 01/15/2015   ALKPHOS 96 01/15/2015   AST 28 01/15/2015   ALT 27 01/15/2015  .   Total Time in preparing paper work, data evaluation and todays exam - 35 minutes  Miyonna Ormiston M.D on 01/18/2015 at 1:48 PM  [facility]   Office  [phone]

## 2015-01-18 NOTE — Care Management Important Message (Signed)
Important Message  Patient Details  Name: Alexa Castillo MRN: [id number] Date of Birth: May 12, 1950   Medicare Important Message Given:  Yes    Kortney Schoenfelder P Kaylie Ritter 01/18/2015, 2:27 PM

## 2015-01-18 NOTE — Progress Notes (Signed)
ANTICOAGULATION CONSULT NOTE - Follow Up Consult  Pharmacy Consult for change to Xarelto from Heparin Indication: PE  No Known Allergies  Patient Measurements: Height: 5\' 2"  (157.5 cm) Weight: 234 lb 12.8 oz (106.505 kg) IBW/kg (Calculated) : 50.1 Heparin Dosing Weight: 78 kg  Vital Signs: Temp: 98.6 F (37 C) (01/05 0626) Temp Source: Oral (01/05 0626) BP: 104/71 mmHg (01/05 0626) Pulse Rate: 89 (01/05 0626)  Labs:  Recent Labs  01/15/15 1632 01/16/15 0207 01/16/15 0755  01/16/15 1350  01/16/15 2127 01/17/15 0308 01/18/15 0439  HGB 14.6 13.4  --   --   --   --   --  12.7 12.9  HCT 44.2 40.7  --   --   --   --   --  39.4 39.7  PLT 223 255  --   --   --   --   --  240 254  HEPARINUNFRC  --  0.31  --   < >  --   < > 0.44 0.41 0.55  CREATININE 0.95 0.85  --   --   --   --   --   --   --   TROPONINI 0.75* 0.80* 0.71*  --  0.64*  --   --   --   --   < > = values in this interval not displayed.  Estimated Creatinine Clearance: 75.7 mL/min (by C-G formula based on Cr of 0.85).  Assessment: 65 yo F on IV heparin for a large pulmonary emboli with evidence of right heart strain. Pt being changed to xarelto. CBC stable, no s/s of bleed.  Goal of Therapy:  Resolution and prevention of VTE  Plan:  Discontinue heparin gtt and starting Xarelto 15 mg BID x 21 days, then 20 mg daily Monitor daily CBC, renal function, and s/s of bleed Will educate patient now on Xarelto, to be discharged today  Evon Slack, PharmD  Clinical Pharmacist   01/18/2015 1:44 PM

## 2015-01-18 NOTE — Progress Notes (Addendum)
SATURATION QUALIFICATIONS: (This note is used to comply with regulatory documentation for home oxygen)  Patient Saturations on Room Air at Rest = 93%  Patient Saturations on Room Air while Ambulating = 85%  Patient Saturations on 2 liters 02 while Ambulating = 89%   Please briefly explain why patient needs home oxygen: desaturation with exertion

## 2015-01-29 ENCOUNTER — Encounter (HOSPITAL_COMMUNITY): Payer: Medicare PPO | Attending: Hematology & Oncology | Admitting: Hematology & Oncology

## 2015-01-29 ENCOUNTER — Encounter (HOSPITAL_COMMUNITY): Payer: Self-pay | Admitting: Hematology & Oncology

## 2015-01-29 VITALS — BP 161/85 | HR 105 | Temp 98.6°F | Resp 16 | Wt 238.0 lb

## 2015-01-29 DIAGNOSIS — Z170 Estrogen receptor positive status [ER+]: Secondary | ICD-10-CM

## 2015-01-29 DIAGNOSIS — I2782 Chronic pulmonary embolism: Secondary | ICD-10-CM | POA: Diagnosis not present

## 2015-01-29 DIAGNOSIS — C50911 Malignant neoplasm of unspecified site of right female breast: Secondary | ICD-10-CM

## 2015-01-29 DIAGNOSIS — I2699 Other pulmonary embolism without acute cor pulmonale: Secondary | ICD-10-CM

## 2015-01-29 DIAGNOSIS — C50319 Malignant neoplasm of lower-inner quadrant of unspecified female breast: Secondary | ICD-10-CM

## 2015-01-29 LAB — D-DIMER, QUANTITATIVE (NOT AT ARMC): D DIMER QUANT: 0.51 ug{FEU}/mL — AB (ref 0.00–0.50)

## 2015-01-29 NOTE — Patient Instructions (Addendum)
[place] at [facility] Discharge Instructions  RECOMMENDATIONS MADE BY THE CONSULTANT AND ANY TEST RESULTS WILL BE SENT TO YOUR REFERRING PHYSICIAN.    Exam completed by Dr Whitney Muse today May do some labs today Continue taking your Xarelto as prescribed Continue taking arimidex as prescribed Mammogram in may  Return to see the doctor in 3 months Please call the clinic if you have any questions or concerns     Thank you for choosing [place] at [facility] to provide your oncology and hematology care.  To afford each patient quality time with our provider, please arrive at least 15 minutes before your scheduled appointment time.    You need to re-schedule your appointment should you arrive 10 or more minutes late.  We strive to give you quality time with our providers, and arriving late affects you and other patients whose appointments are after yours.  Also, if you no show three or more times for appointments you may be dismissed from the clinic at the providers discretion.     Again, thank you for choosing [facility].  Our hope is that these requests will decrease the amount of time that you wait before being seen by our physicians.       _____________________________________________________________  Should you have questions after your visit to [facility], please contact our office at [phone] between the hours of 8:30 a.m. and 4:30 p.m.  Voicemails left after 4:30 p.m. will not be returned until the following business day.  For prescription refill requests, have your pharmacy contact our office.

## 2015-01-29 NOTE — Progress Notes (Signed)
[facility], PA-C [address] [place] [id number]  Right breast cancer T1bN0M0 ER Positive status post lumpectomy and sentinel node biopsy (07/24/2010)  Adjuvant Radiation Anastrozole initiated on 12/10/2010.  plans to continue therapy at least through November of 2017.  DEXA 01/25/2014 with normal bone density  Mammogram 05/30/2014  CURRENT THERAPY: Arimidex  INTERVAL HISTORY: Alexa Castillo 65 y.o. female returns for follow-up of her ER+ early stage breast cancer.  Alexa Castillo returns to the [facility] alone today. She confirms that she is ready to be finished with her medicine this coming November. She says that, overall, she's doing pretty good.  She did recently have a blood clot that put her in the hospital around [place]. She isn't sure how it happened, but states that on New Year's day, she noticed she was more short of breath. The next day, she woke up and was experiencing chest pain, and felt "really" short of breath. This is what prompted her to go to the ER. After she was released from the hospital, they gave her oxygen to use for two weeks, but she remarks that though she's been using it, she hasn't been using it all day long, and confirms that her shortness of breath is much better. She says she is feeling pretty good without it, and remarks that when she walks around, she's not short of breath at all.  When they worked her up in the ER, they did not find any clots in her legs. She had no recent surgery, no recent falls, and no other injuries that she can think of in the last few months that would have caused her blood clot. She has had no long distance travel. She also cannot think of any new medications she's started in the past few months either.  She is on Xarelto as a result of her recent blood clot. She further reports that she walked up here by herself today and experienced no major shortness of breath or chest pain.  When asked about a family  history of blood clots, she comments that her cousin has had blood clots in her legs and her lung, but that blood clotting does not run in her immediate family as far as she knows. She is unsure if special bloodwork was done on her while she was in the ER to check on her risk for clots.  Alexa Castillo reports that her appetite has been good and she says she hasn't experienced any new pain. Her back still hurts from time to time, but this is her norm.  She will need a mammogram in May, but confirms that she's up to date with everything else.  MEDICAL HISTORY: Past Medical History  Diagnosis Date  . Diabetes mellitus   . Hypertension   . Obesity   . Low back pain   . Arthritis     back/saw Dr. Aline Brochure  . Sleep apnea 04/2013  . Cancer [facility])     breast  . Breast cancer ([place])     has Right breast cancer T1bN0M0 ER Positive 2012; Osteoarthritis of hip; DDD (degenerative disc disease), lumbar; Weakness of right leg; Left leg weakness; Obstructive sleep apnea syndrome in adult; Pulmonary embolism ([place]); Arthritis; Hypertension; Chest pain on breathing; Dyspnea on exertion; Troponin level elevated; Essential hypertension; and PE (pulmonary thromboembolism) ([place]) on her problem list.     No history exists.     has No Known Allergies.  Alexa Castillo had no medications administered  during this visit.  SURGICAL HISTORY: Past Surgical History  Procedure Laterality Date  . Tubal ligation    . Biopsy breast      SOCIAL HISTORY: Social History   Social History  . Marital Status: Single    Spouse Name: N/A  . Number of Children: N/A  . Years of Education: N/A   Occupational History  . Not on file.   Social History Main Topics  . Smoking status: Former Smoker -- 1.50 packs/day for 17 years    Types: Cigarettes    Quit date: 12/18/1994  . Smokeless tobacco: Never Used  . Alcohol Use: No  . Drug Use: No  . Sexual Activity: Not on file   Other Topics Concern  . Not on file   Social  History Narrative    FAMILY HISTORY: Family History  Problem Relation Age of Onset  . Liver cancer Mother   . Cancer Mother   . Diabetes Mother   . Breast cancer Sister   . Cancer Sister     Review of Systems  Musculoskeletal: Positive for back pain and joint pain. Negative for myalgias, falls and neck pain.  All other systems reviewed and are negative. 14 point review of systems was performed and is negative except as detailed under history of present illness and above  PHYSICAL EXAMINATION  ECOG PERFORMANCE STATUS: 1 - Symptomatic but completely ambulatory  Filed Vitals:   01/29/15 1305  BP: 161/85  Pulse: 105  Temp: 98.6 F (37 C)  Resp: 16    Physical Exam  Constitutional: She is oriented to person, place, and time and well-developed, well-nourished, and in no distress.  HENT:  Head: Normocephalic and atraumatic.  Nose: Nose normal.  Mouth/Throat: Oropharynx is clear and moist. No oropharyngeal exudate.  Eyes: Conjunctivae and EOM are normal. Pupils are equal, round, and reactive to light. Right eye exhibits no discharge. Left eye exhibits no discharge. No scleral icterus.  Neck: Normal range of motion. Neck supple. No tracheal deviation present. No thyromegaly present.  Cardiovascular: Normal rate, regular rhythm and normal heart sounds.  Exam reveals no gallop and no friction rub.   No murmur heard. Pulmonary/Chest: Effort normal and breath sounds normal. She has no wheezes. She has no rales.  Abdominal: Soft. Bowel sounds are normal. She exhibits no distension and no mass. There is no tenderness. There is no rebound and no guarding.  Musculoskeletal: Normal range of motion. She exhibits no edema.  Lymphadenopathy:    She has no cervical adenopathy.  Neurological: She is alert and oriented to person, place, and time. She has normal reflexes. No cranial nerve deficit. Gait normal. Coordination normal.  Skin: Skin is warm and dry. No rash noted.  Psychiatric: Mood,  memory, affect and judgment normal.  Nursing note and vitals reviewed.   LABORATORY DATA: I have reviewed the data as listed  CBC    Component Value Date/Time   WBC 10.4 01/18/2015 0439   RBC 4.44 01/18/2015 0439   HGB 12.9 01/18/2015 0439   HCT 39.7 01/18/2015 0439   PLT 254 01/18/2015 0439   MCV 89.4 01/18/2015 0439   MCH 29.1 01/18/2015 0439   MCHC 32.5 01/18/2015 0439   RDW 15.0 01/18/2015 0439   LYMPHSABS 3.3 01/15/2015 1632   MONOABS 0.9 01/15/2015 1632   EOSABS 0.2 01/15/2015 1632   BASOSABS 0.0 01/15/2015 1632   CMP     Component Value Date/Time   NA 140 01/16/2015 0207   K 3.6 01/16/2015 0207  CL 103 01/16/2015 0207   CO2 25 01/16/2015 0207   GLUCOSE 191* 01/16/2015 0207   BUN 7 01/16/2015 0207   CREATININE 0.85 01/16/2015 0207   CALCIUM 9.4 01/16/2015 0207   PROT 7.8 01/15/2015 1632   ALBUMIN 3.6 01/15/2015 1632   AST 28 01/15/2015 1632   ALT 27 01/15/2015 1632   ALKPHOS 96 01/15/2015 1632   BILITOT 0.5 01/15/2015 1632   GFRNONAA >60 01/16/2015 0207   GFRAA >60 01/16/2015 0207     ASSESSMENT and THERAPY PLAN:  Right breast cancer T1bN0M0 ER Positive Anastrozole initiated on 12/10/2010 plans to continue therapy at least through November of 2017. DEXA 01/25/2014 with normal bone density Mammogram 05/30/2014 Chronic LBP Hospital admission on 01/15/2015 through 01/18/2015 for PE, cardiac strain noted on CT  PE is reported on AI therapy. I discussed with the patient however that I have not had a patient develop PE while on arimidex therapy however (that certainly does not mean it does not happen!). We discussed duration of XARELTO and at this point continuing until she has completed arimidex is not unreasonable if she is doing well. She does with to try to complete AI therapy.  I discussed this with Dr. Lindi Adie as well.   She will need a hypercoag panel as there is no other obvious cause for her PE. She is currently doing well and no longer needs O2.   I will  also set up her mammogram for May.  Alexa Castillo will return for follow-up in 3 months.  No orders of the defined types were placed in this encounter.   All questions were answered. The patient knows to call the clinic with any problems, questions or concerns. We can certainly see the patient much sooner if necessary.   This document serves as a record of services personally performed by Ancil Linsey, MD. It was created on her behalf by Toni Amend, a trained medical scribe. The creation of this record is based on the scribe's personal observations and the provider's statements to them. This document has been checked and approved by the attending provider.  I have reviewed the above documentation for accuracy and completeness, and I agree with the above.  Kelby Fam. Whitney Muse, MD

## 2015-01-29 NOTE — Progress Notes (Signed)
Alexa Castillo's reason for visit today is for labs as scheduled per MD orders.  Venipuncture performed with a 23 gauge butterfly needle to L Antecubital.  Alexa Castillo tolerated procedure well and without incident; questions were answered and patient was discharged.

## 2015-01-30 ENCOUNTER — Other Ambulatory Visit (HOSPITAL_COMMUNITY): Payer: Self-pay | Admitting: Hematology & Oncology

## 2015-01-30 DIAGNOSIS — Z9889 Other specified postprocedural states: Secondary | ICD-10-CM

## 2015-01-30 LAB — CARDIOLIPIN ANTIBODIES, IGG, IGM, IGA
Anticardiolipin IgG: <9 GPL U/mL (ref 0–14)
Anticardiolipin IgM: <9 MPL U/mL (ref 0–12)

## 2015-01-31 LAB — BETA-2-GLYCOPROTEIN I ABS, IGG/M/A

## 2015-02-01 LAB — FACTOR 5 LEIDEN

## 2015-02-02 LAB — PROTHROMBIN GENE MUTATION

## 2015-02-08 ENCOUNTER — Encounter (HOSPITAL_COMMUNITY): Payer: Self-pay | Admitting: *Deleted

## 2015-02-08 ENCOUNTER — Emergency Department (HOSPITAL_COMMUNITY)
Admission: EM | Admit: 2015-02-08 | Discharge: 2015-02-08 | Disposition: A | Discharge status: Home / Self Care | Payer: Medicare PPO | Attending: Emergency Medicine | Admitting: Emergency Medicine

## 2015-02-08 DIAGNOSIS — Z853 Personal history of malignant neoplasm of breast: Secondary | ICD-10-CM | POA: Diagnosis not present

## 2015-02-08 DIAGNOSIS — E669 Obesity, unspecified: Secondary | ICD-10-CM | POA: Insufficient documentation

## 2015-02-08 DIAGNOSIS — I10 Essential (primary) hypertension: Secondary | ICD-10-CM | POA: Diagnosis not present

## 2015-02-08 DIAGNOSIS — R21 Rash and other nonspecific skin eruption: Secondary | ICD-10-CM | POA: Diagnosis present

## 2015-02-08 DIAGNOSIS — M1990 Unspecified osteoarthritis, unspecified site: Secondary | ICD-10-CM | POA: Diagnosis not present

## 2015-02-08 DIAGNOSIS — Z8669 Personal history of other diseases of the nervous system and sense organs: Secondary | ICD-10-CM | POA: Insufficient documentation

## 2015-02-08 DIAGNOSIS — Z79899 Other long term (current) drug therapy: Secondary | ICD-10-CM | POA: Diagnosis not present

## 2015-02-08 DIAGNOSIS — Z7901 Long term (current) use of anticoagulants: Secondary | ICD-10-CM | POA: Diagnosis not present

## 2015-02-08 DIAGNOSIS — L232 Allergic contact dermatitis due to cosmetics: Secondary | ICD-10-CM | POA: Diagnosis not present

## 2015-02-08 DIAGNOSIS — Z7984 Long term (current) use of oral hypoglycemic drugs: Secondary | ICD-10-CM | POA: Diagnosis not present

## 2015-02-08 DIAGNOSIS — R000 Tachycardia, unspecified: Secondary | ICD-10-CM | POA: Diagnosis not present

## 2015-02-08 DIAGNOSIS — Z87891 Personal history of nicotine dependence: Secondary | ICD-10-CM | POA: Diagnosis not present

## 2015-02-08 DIAGNOSIS — L259 Unspecified contact dermatitis, unspecified cause: Secondary | ICD-10-CM

## 2015-02-08 DIAGNOSIS — E119 Type 2 diabetes mellitus without complications: Secondary | ICD-10-CM | POA: Diagnosis not present

## 2015-02-08 MED ORDER — METHYLPREDNISOLONE ACETATE 80 MG/ML IJ SUSP
80.0000 mg | Freq: Once | INTRAMUSCULAR | Status: AC
  Administered 2015-02-08: 80 mg via INTRAMUSCULAR
  Filled 2015-02-08: qty 1

## 2015-02-08 MED ORDER — HALOBETASOL PROPIONATE 0.05 % EX CREA: TOPICAL_CREAM | Freq: Two times a day (BID) | CUTANEOUS | Status: DC

## 2015-02-08 MED ORDER — PREDNISONE 20 MG PO TABS: ORAL_TABLET | ORAL | Status: DC

## 2015-02-08 NOTE — ED Provider Notes (Signed)
CSN: [id number]     Arrival date & time 02/08/15  1034 History   By signing my name below, I, Alexa Castillo, attest that this documentation has been prepared under the direction and in the presence of No att. providers found Electronically Signed: Evonnie Dawes, ED Scribe 02/09/2015 at 7:35 AM.     Chief Complaint  Patient presents with  . Allergic Reaction   The history is provided by the patient. No language interpreter was used.  HPI Comments: Alexa Castillo is a 65 y.o. female with a h/o DM and eczema who presents to the Emergency Department for evaluation of possible allergic reaction to her bilateral hands that began yesterday. She reports associated moderate to severe swelling of left hand and itchy hives to b/l hands. Pt notes she came in contact with known soap allergy in home; pt thought she poured all the soap out and replaced it, however some may have remained. Pt admits that symptoms are similar to previous alllergic reaction after using the same soap. She notes she has previously been prescribed prednisone for similar episodes with relief. Pt has an appt with her dermatologist scheduled for next week. Pt denies any other rashes elsewhere. Pt denies any SOB, n/v/d or, throat swelling. Pt notes was also recently Dx with blood clots and is taking Xarelto for mgmt of this.  Past Medical History  Diagnosis Date  . Diabetes mellitus   . Hypertension   . Obesity   . Low back pain   . Arthritis     back/saw Dr. Aline Brochure  . Sleep apnea 04/2013  . Cancer [facility])     breast  . Breast cancer [facility])    Past Surgical History  Procedure Laterality Date  . Tubal ligation    . Biopsy breast     Family History  Problem Relation Age of Onset  . Liver cancer Mother   . Cancer Mother   . Diabetes Mother   . Breast cancer Sister   . Cancer Sister    Social History  Substance Use Topics  . Smoking status: Former Smoker -- 1.50 packs/day for 17 years    Types: Cigarettes   Quit date: 12/18/1994  . Smokeless tobacco: Never Used  . Alcohol Use: No   OB History    Gravida Para Term Preterm AB TAB SAB Ectopic Multiple Living   3 2 2  1  1   2      Review of Systems  Skin: Positive for rash.  Allergic/Immunologic: Positive for environmental allergies (soap).  All other systems reviewed and are negative.     Allergies  Review of patient's allergies indicates no known allergies.  Home Medications   Prior to Admission medications   Medication Sig Start Date End Date Taking? Authorizing Provider  anastrozole (ARIMIDEX) 1 MG tablet Take 1 tablet (1 mg total) by mouth daily. 12/27/14  Yes [facility], PA-C  calcium-vitamin D (OSCAL WITH D) 500-200 MG-UNIT per tablet Take 1 tablet by mouth 2 (two) times daily.   Yes Historical Provider, MD  glipiZIDE (GLUCOTROL) 10 MG tablet Take 10 mg by mouth 2 (two) times daily before a meal.  06/18/10  Yes Historical Provider, MD  metFORMIN (GLUCOPHAGE) 1000 MG tablet Take 1,000 mg by mouth 2 (two) times daily. 06/20/14  Yes Historical Provider, MD  metoprolol succinate (TOPROL-XL) 25 MG 24 hr tablet Take 25 mg by mouth daily. 06/20/14  Yes Historical Provider, MD  Omega-3 Fatty Acids (FISH OIL) 1000 MG CPDR Take  1,000 mg by mouth daily.    Yes Historical Provider, MD  omeprazole (PRILOSEC) 20 MG capsule Take 20 mg by mouth every other day. 04/18/13  Yes Historical Provider, MD  Rivaroxaban (XARELTO STARTER PACK) 15 & 20 MG TBPK Take as directed on package: Start with one 15mg  tablet by mouth twice a day with food. On Day 22, switch to one 20mg  tablet once a day with food. 01/19/15  Yes Silver Huguenin Elgergawy, MD  halobetasol (ULTRAVATE) 0.05 % cream Apply topically 2 (two) times daily. 02/08/15   Merrily Pew, MD  predniSONE (DELTASONE) 20 MG tablet 3 tabs po daily x 3 days, then 2 tabs x 3 days, then 1.5 tabs x 3 days, then 1 tab x 3 days, then 0.5 tabs x 3 days 02/08/15   Merrily Pew, MD   BP 126/72 mmHg  Pulse 78  Temp(Src) 98.3  F (36.8 C) (Oral)  Resp 14  Ht 5\' 2"  (1.575 m)  Wt 235 lb (106.595 kg)  BMI 42.97 kg/m2  SpO2 100% Physical Exam  Constitutional: She is oriented to person, place, and time. She appears well-developed and well-nourished. No distress.  HENT:  Head: Normocephalic and atraumatic.  Eyes: Conjunctivae and EOM are normal.  Neck: Neck supple. No tracheal deviation present.  Cardiovascular: Normal rate and normal heart sounds.  Exam reveals no gallop and no friction rub.   No murmur heard. Slightly tachycardic.   Pulmonary/Chest: Effort normal and breath sounds normal. No respiratory distress. She has no wheezes. She has no rales.  No crackles  Musculoskeletal: Normal range of motion.  Neurological: She is alert and oriented to person, place, and time.  Skin: Skin is warm and dry. Rash noted.  Left Hand: 2 large bullae on volar aspect of left palm. Multiple fluid filled lesions with surrounding erythema, some left dorsal swelling, no pitting. Good pulse Right Hand: few fluid fillled vesicle lesions on lateral side of 4th digit, no erythema, full ROM. No other rashes elsewhere  Psychiatric: She has a normal mood and affect. Her behavior is normal.  Nursing note and vitals reviewed.   ED Course  Procedures (including critical care time) DIAGNOSTIC STUDIES: Oxygen Saturation is 93% on RA, adequate by my interpretation.    COORDINATION OF CARE: 10:49 AM-Will prescribe 2 week prednisone taper. Discussed with pt at bedside and pt agreed to plan.   Labs Review Labs Reviewed - No data to display  Imaging Review No results found.    EKG Interpretation None      MDM   Final diagnoses:  Contact dermatitis   Patient with multiple bullae and vesicles on her left hand after contact with soap that she's been known to be allergic to person the past. A few vesicles on her right fingers well. No dyspnea, chest pain, vomiting, diarrhea or other evidence of anaphylaxis. I discussed case  with the dermatologist who she has appointment with on Tuesday and I will start a prednisone taper and reinstitute topical steroid cream. This seems to work for her couple years ago when she had the same reaction. Seems To be more of a contact dermatitis than it is allergic reaction.   I personally performed the services described in this documentation, which was scribed in my presence. The recorded information has been reviewed and is accurate.      Merrily Pew, MD 02/09/15 [phone]

## 2015-02-08 NOTE — ED Notes (Signed)
Pt presents with left hand swelling and blistering. Pt used a soap 2 days ago and believes this is the cause. Pt denies any medication use. NAD noted upon triage. No trouble breathing.

## 2015-02-27 ENCOUNTER — Telehealth (HOSPITAL_COMMUNITY): Payer: Self-pay | Admitting: *Deleted

## 2015-02-27 NOTE — Telephone Encounter (Signed)
Patient notified that she could discontinue the Arimidex due to leg weakness and that Dr. Whitney Muse would get patient in sooner than April to be seen to discuss discontinuing treatment. Appt 3/8 @ 9:30. Patient aware of all these instructions.

## 2015-03-02 ENCOUNTER — Encounter (HOSPITAL_COMMUNITY): Payer: Self-pay | Admitting: *Deleted

## 2015-03-02 ENCOUNTER — Emergency Department (HOSPITAL_COMMUNITY)
Admission: EM | Admit: 2015-03-02 | Discharge: 2015-03-03 | Disposition: A | Discharge status: Home / Self Care | Payer: Medicare PPO | Attending: Emergency Medicine | Admitting: Emergency Medicine

## 2015-03-02 DIAGNOSIS — N390 Urinary tract infection, site not specified: Secondary | ICD-10-CM | POA: Insufficient documentation

## 2015-03-02 DIAGNOSIS — I10 Essential (primary) hypertension: Secondary | ICD-10-CM | POA: Insufficient documentation

## 2015-03-02 DIAGNOSIS — E1165 Type 2 diabetes mellitus with hyperglycemia: Secondary | ICD-10-CM | POA: Diagnosis present

## 2015-03-02 DIAGNOSIS — Z79899 Other long term (current) drug therapy: Secondary | ICD-10-CM | POA: Insufficient documentation

## 2015-03-02 DIAGNOSIS — E669 Obesity, unspecified: Secondary | ICD-10-CM | POA: Diagnosis not present

## 2015-03-02 DIAGNOSIS — R739 Hyperglycemia, unspecified: Secondary | ICD-10-CM

## 2015-03-02 DIAGNOSIS — Z853 Personal history of malignant neoplasm of breast: Secondary | ICD-10-CM | POA: Insufficient documentation

## 2015-03-02 LAB — CBC
HEMATOCRIT: 43.8 % (ref 36.0–46.0)
HEMOGLOBIN: 14.6 g/dL (ref 12.0–15.0)
MCH: 29.3 pg (ref 26.0–34.0)
MCHC: 33.3 g/dL (ref 30.0–36.0)
MCV: 87.8 fL (ref 78.0–100.0)
Platelets: 215 10*3/uL (ref 150–400)
RBC: 4.99 MIL/uL (ref 3.87–5.11)
RDW: 14.7 % (ref 11.5–15.5)
WBC: 14.3 10*3/uL — ABNORMAL HIGH (ref 4.0–10.5)

## 2015-03-02 LAB — BASIC METABOLIC PANEL
Anion gap: 12 (ref 5–15)
BUN: 15 mg/dL (ref 6–20)
CALCIUM: 9.3 mg/dL (ref 8.9–10.3)
CHLORIDE: 99 mmol/L — AB (ref 101–111)
CO2: 23 mmol/L (ref 22–32)
Creatinine, Ser: 0.92 mg/dL (ref 0.44–1.00)
GLUCOSE: 482 mg/dL — AB (ref 65–99)
Potassium: 4.3 mmol/L (ref 3.5–5.1)
Sodium: 134 mmol/L — ABNORMAL LOW (ref 135–145)

## 2015-03-02 LAB — CBG MONITORING, ED
GLUCOSE-CAPILLARY: 417 mg/dL — AB (ref 65–99)
Glucose-Capillary: 458 mg/dL — ABNORMAL HIGH (ref 65–99)

## 2015-03-02 MED ORDER — SODIUM CHLORIDE 0.9 % IV SOLN
INTRAVENOUS | Status: DC
  Administered 2015-03-03: 01:00:00 via INTRAVENOUS

## 2015-03-02 MED ORDER — INSULIN ASPART 100 UNIT/ML ~~LOC~~ SOLN
5.0000 [IU] | Freq: Once | SUBCUTANEOUS | Status: AC
  Administered 2015-03-02: 5 [IU] via SUBCUTANEOUS
  Filled 2015-03-02: qty 1

## 2015-03-02 MED ORDER — SODIUM CHLORIDE 0.9 % IV BOLUS (SEPSIS)
1000.0000 mL | Freq: Once | INTRAVENOUS | Status: AC
  Administered 2015-03-02: 1000 mL via INTRAVENOUS

## 2015-03-02 NOTE — ED Notes (Signed)
Pt c/o generalized weakness for the past few days, elevated blood sugar tonight, states that she has not taken her blood sugar lately due to not being able to use her new machine, her granddaughter took it tonight and it was over 500.

## 2015-03-02 NOTE — ED Provider Notes (Signed)
By signing my name below, I, Helane Gunther, attest that this documentation has been prepared under the direction and in the presence of [employer], DO. Electronically Signed: Helane Gunther, ED Scribe. 03/02/2015. 11:22 PM.  TIME SEEN: 11:18 PM  CHIEF COMPLAINT: Hyperglycemia  [id number] Alexa Castillo is a 65 y.o. female with a PMHx of NIDDM, HTN, obesity, and breast cancer who presents to the Emergency Department complaining of hyperglycemia first noticed this evening when it was measured greater than 500. Pt states she has not been checking her sugars recently up until her granddaughter measured it tonight. She reports associated weakness, increased thirst, and urinary frequency. She notes she is on metformin and glipizide. Reports compliance with these medications but states that she has not been checking her blood glucose recently because of her new glucometer. Pt denies fever, cough, n/v/d, CP, SOB, numbness, and tingling, focal weakness.   ROS: See HPI Constitutional: no fever  Eyes: no drainage  ENT: no runny nose   Cardiovascular:  no chest pain  Resp: no SOB  GI: no vomiting GU: no dysuria Integumentary: no rash  Allergy: no hives  Musculoskeletal: no leg swelling  Neurological: no slurred speech ROS otherwise negative  PAST MEDICAL HISTORY/PAST SURGICAL HISTORY:  Past Medical History  Diagnosis Date  . Diabetes mellitus   . Hypertension   . Obesity   . Low back pain   . Arthritis     back/saw Dr. Aline Brochure  . Sleep apnea 04/2013  . Cancer ([place])     breast  . Breast cancer ([place])     MEDICATIONS:  Prior to Admission medications   Medication Sig Start Date End Date Taking? Authorizing Provider  calcium-vitamin D (OSCAL WITH D) 500-200 MG-UNIT per tablet Take 1 tablet by mouth 2 (two) times daily.   Yes Historical Provider, MD  glipiZIDE (GLUCOTROL) 10 MG tablet Take 10 mg by mouth 2 (two) times daily before a meal.  06/18/10  Yes Historical Provider, MD  metFORMIN  (GLUCOPHAGE) 1000 MG tablet Take 1,000 mg by mouth 2 (two) times daily. 06/20/14  Yes Historical Provider, MD  metoprolol succinate (TOPROL-XL) 25 MG 24 hr tablet Take 25 mg by mouth daily. 06/20/14  Yes Historical Provider, MD  Omega-3 Fatty Acids (FISH OIL) 1000 MG CPDR Take 1,000 mg by mouth daily.    Yes Historical Provider, MD  omeprazole (PRILOSEC) 20 MG capsule Take 20 mg by mouth every other day. 04/18/13  Yes Historical Provider, MD  rivaroxaban (XARELTO) 20 MG TABS tablet Take 20 mg by mouth daily.   Yes Historical Provider, MD  anastrozole (ARIMIDEX) 1 MG tablet Take 1 tablet (1 mg total) by mouth daily. Patient not taking: Reported on 03/02/2015 12/27/14   Manon Hilding Kefalas, PA-C  halobetasol (ULTRAVATE) 0.05 % cream Apply topically 2 (two) times daily. Patient not taking: Reported on 03/02/2015 02/08/15   Merrily Pew, MD  predniSONE (DELTASONE) 20 MG tablet 3 tabs po daily x 3 days, then 2 tabs x 3 days, then 1.5 tabs x 3 days, then 1 tab x 3 days, then 0.5 tabs x 3 days Patient not taking: Reported on 03/02/2015 02/08/15   Merrily Pew, MD    ALLERGIES:  No Known Allergies  SOCIAL HISTORY:  Social History  Substance Use Topics  . Smoking status: Former Smoker -- 1.50 packs/day for 17 years    Types: Cigarettes    Quit date: 12/18/1994  . Smokeless tobacco: Never Used  . Alcohol Use: No    FAMILY  HISTORY: Family History  Problem Relation Age of Onset  . Liver cancer Mother   . Cancer Mother   . Diabetes Mother   . Breast cancer Sister   . Cancer Sister     EXAM: BP 137/86 mmHg  Pulse 107  Temp(Src) 99.1 F (37.3 C) (Oral)  Resp 16  Ht 5\' 2"  (1.575 m)  Wt 235 lb (106.595 kg)  BMI 42.97 kg/m2  SpO2 95% CONSTITUTIONAL: Alert and oriented and responds appropriately to questions. Well-appearing; well-nourished HEAD: Normocephalic EYES: Conjunctivae clear, PERRL ENT: normal nose; no rhinorrhea; moist mucous membranes; pharynx without lesions noted NECK: Supple, no  meningismus, no LAD  CARD: RRR; S1 and S2 appreciated; no murmurs, no clicks, no rubs, no gallops RESP: Normal chest excursion without splinting or tachypnea; breath sounds clear and equal bilaterally; no wheezes, no rhonchi, no rales, no hypoxia or respiratory distress, speaking full sentences ABD/GI: Normal bowel sounds; non-distended; soft, non-tender, no rebound, no guarding, no peritoneal signs BACK:  The back appears normal and is non-tender to palpation, there is no CVA tenderness EXT: Normal ROM in all joints; non-tender to palpation; no edema; normal capillary refill; no cyanosis, no calf tenderness or swelling    SKIN: Normal color for age and race; warm NEURO: Moves all extremities equally, sensation to light touch intact diffusely, cranial nerves II through XII intact PSYCH: The patient's mood and manner are appropriate. Grooming and personal hygiene are appropriate.  MEDICAL DECISION MAKING: Patient here with hyperglycemia. Suspect poor compliance to diet as patient has a [employer] bag in the room with her. Reports she has been taking her side and metformin. Lap show mild leukocytosis which may be reactive. She has no infectious complaints. Her glucose has improved slightly with IV hydration. We'll give subcutaneous insulin and continue to hydrate patient and reassess. She is not in DKA.  ED PROGRESS:  1:30 AM  Patient's urine does show a small amount of ketones. She does have blood in her urine and is nitrite positive with many bacteria.  There are 6-30 WBCs.  We'll send urine culture.   We'll give IV ceftriaxone for UTI. This could be contributing to her hyperglycemia. Blood glucose is now 376 after fluids and 5 units of subcutaneous insulin. Will give another 5 units and continue to hydrate patient.   3:30  AM  Pt's  Blood glucose continues to improve. She has received IV fluids, ceftriaxone. I feel she is safe to be discharged home and continue her glipizide and metformin. I recommend  close outpatient follow-up. We'll discharge on Keflex for her UTI. Recommended changing her diet. Discussed return precautions. She verbalized understanding and is comfortable with this plan.      I personally performed the services described in this documentation, which was scribed in my presence. The recorded information has been reviewed and is accurate.    [place], DO 03/03/15 [phone]

## 2015-03-03 LAB — URINALYSIS, ROUTINE W REFLEX MICROSCOPIC
Bilirubin Urine: NEGATIVE
Ketones, ur: 15 mg/dL — AB
Leukocytes, UA: NEGATIVE
Nitrite: POSITIVE — AB
PH: 5.5 (ref 5.0–8.0)
Protein, ur: NEGATIVE mg/dL
SPECIFIC GRAVITY, URINE: 1.01 (ref 1.005–1.030)

## 2015-03-03 LAB — CBG MONITORING, ED
GLUCOSE-CAPILLARY: 376 mg/dL — AB (ref 65–99)
Glucose-Capillary: 337 mg/dL — ABNORMAL HIGH (ref 65–99)

## 2015-03-03 LAB — URINE MICROSCOPIC-ADD ON

## 2015-03-03 MED ORDER — INSULIN ASPART 100 UNIT/ML ~~LOC~~ SOLN
5.0000 [IU] | Freq: Once | SUBCUTANEOUS | Status: AC
  Administered 2015-03-03: 5 [IU] via SUBCUTANEOUS
  Filled 2015-03-03: qty 1

## 2015-03-03 MED ORDER — DEXTROSE 5 % IV SOLN
1.0000 g | Freq: Once | INTRAVENOUS | Status: AC
  Administered 2015-03-03: 1 g via INTRAVENOUS
  Filled 2015-03-03: qty 10

## 2015-03-03 MED ORDER — CEPHALEXIN 500 MG PO CAPS: 500.0000 mg | ORAL_CAPSULE | Freq: Four times a day (QID) | ORAL | Status: DC

## 2015-03-03 NOTE — Discharge Instructions (Signed)
Blood Glucose Monitoring, Adult Monitoring your blood glucose (also know as blood sugar) helps you to manage your diabetes. It also helps you and your health care provider monitor your diabetes and determine how well your treatment plan is working. WHY SHOULD YOU MONITOR YOUR BLOOD GLUCOSE?  It can help you understand how food, exercise, and medicine affect your blood glucose.  It allows you to know what your blood glucose is at any given moment. You can quickly tell if you are having low blood glucose (hypoglycemia) or high blood glucose (hyperglycemia).  It can help you and your health care provider know how to adjust your medicines.  It can help you understand how to manage an illness or adjust medicine for exercise. WHEN SHOULD YOU TEST? Your health care provider will help you decide how often you should check your blood glucose. This may depend on the type of diabetes you have, your diabetes control, or the types of medicines you are taking. Be sure to write down all of your blood glucose readings so that this information can be reviewed with your health care provider. See below for examples of testing times that your health care provider may suggest. Type 1 Diabetes  Test at least 2 times per day if your diabetes is well controlled, if you are using an insulin pump, or if you perform multiple daily injections.  If your diabetes is not well controlled or if you are sick, you may need to test more often.  It is a good idea to also test:  Before every insulin injection.  Before and after exercise.  Between meals and 2 hours after a meal.  Occasionally between 2:00 a.m. and 3:00 a.m. Type 2 Diabetes  If you are taking insulin, test at least 2 times per day. However, it is best to test before every insulin injection.  If you take medicines by mouth (orally), test 2 times a day.  If you are on a controlled diet, test once a day.  If your diabetes is not well controlled or if you  are sick, you may need to monitor more often. HOW TO MONITOR YOUR BLOOD GLUCOSE Supplies Needed  Blood glucose meter.  Test strips for your meter. Each meter has its own strips. You must use the strips that go with your own meter.  A pricking needle (lancet).  A device that holds the lancet (lancing device).  A journal or log book to write down your results. Procedure  Wash your hands with soap and water. Alcohol is not preferred.  Prick the side of your finger (not the tip) with the lancet.  Gently milk the finger until a small drop of blood appears.  Follow the instructions that come with your meter for inserting the test strip, applying blood to the strip, and using your blood glucose meter. Other Areas to Get Blood for Testing Some meters allow you to use other areas of your body (other than your finger) to test your blood. These areas are called alternative sites. The most common alternative sites are:  The forearm.  The thigh.  The back area of the lower leg.  The palm of the hand. The blood flow in these areas is slower. Therefore, the blood glucose values you get may be delayed, and the numbers are different from what you would get from your fingers. Do not use alternative sites if you think you are having hypoglycemia. Your reading will not be accurate. Always use a finger if you are  having hypoglycemia. Also, if you cannot feel your lows (hypoglycemia unawareness), always use your fingers for your blood glucose checks. ADDITIONAL TIPS FOR GLUCOSE MONITORING  Do not reuse lancets.  Always carry your supplies with you.  All blood glucose meters have a 24-hour "hotline" number to call if you have questions or need help.  Adjust (calibrate) your blood glucose meter with a control solution after finishing a few boxes of strips. BLOOD GLUCOSE RECORD KEEPING It is a good idea to keep a daily record or log of your blood glucose readings. Most glucose meters, if not all,  keep your glucose records stored in the meter. Some meters come with the ability to download your records to your home computer. Keeping a record of your blood glucose readings is especially helpful if you are wanting to look for patterns. Make notes to go along with the blood glucose readings because you might forget what happened at that exact time. Keeping good records helps you and your health care provider to work together to achieve good diabetes management.    This information is not intended to replace advice given to you by your health care provider. Make sure you discuss any questions you have with your health care provider.   Document Released: 01/02/2003 Document Revised: 01/20/2014 Document Reviewed: 05/24/2012 Elsevier Interactive Patient Education 2016 [place].  Hyperglycemia Hyperglycemia occurs when the glucose (sugar) in your blood is too high. Hyperglycemia can happen for many reasons, but it most often happens to people who do not know they have diabetes or are not managing their diabetes properly.  CAUSES  Whether you have diabetes or not, there are other causes of hyperglycemia. Hyperglycemia can occur when you have diabetes, but it can also occur in other situations that you might not be as aware of, such as: Diabetes  If you have diabetes and are having problems controlling your blood glucose, hyperglycemia could occur because of some of the following reasons:  Not following your meal plan.  Not taking your diabetes medications or not taking it properly.  Exercising less or doing less activity than you normally do.  Being sick. Pre-diabetes  This cannot be ignored. Before people develop Type 2 diabetes, they almost always have "pre-diabetes." This is when your blood glucose levels are higher than normal, but not yet high enough to be diagnosed as diabetes. Research has shown that some long-term damage to the body, especially the heart and circulatory system, may  already be occurring during pre-diabetes. If you take action to manage your blood glucose when you have pre-diabetes, you may delay or prevent Type 2 diabetes from developing. Stress  If you have diabetes, you may be "diet" controlled or on oral medications or insulin to control your diabetes. However, you may find that your blood glucose is higher than usual in the hospital whether you have diabetes or not. This is often referred to as "stress hyperglycemia." Stress can elevate your blood glucose. This happens because of hormones put out by the body during times of stress. If stress has been the cause of your high blood glucose, it can be followed regularly by your caregiver. That way he/she can make sure your hyperglycemia does not continue to get worse or progress to diabetes. Steroids  Steroids are medications that act on the infection fighting system (immune system) to block inflammation or infection. One side effect can be a rise in blood glucose. Most people can produce enough extra insulin to allow for this rise, but  for those who cannot, steroids make blood glucose levels go even higher. It is not unusual for steroid treatments to "uncover" diabetes that is developing. It is not always possible to determine if the hyperglycemia will go away after the steroids are stopped. A special blood test called an A1c is sometimes done to determine if your blood glucose was elevated before the steroids were started. SYMPTOMS  Thirsty.  Frequent urination.  Dry mouth.  Blurred vision.  Tired or fatigue.  Weakness.  Sleepy.  Tingling in feet or leg. DIAGNOSIS  Diagnosis is made by monitoring blood glucose in one or all of the following ways:  A1c test. This is a chemical found in your blood.  Fingerstick blood glucose monitoring.  Laboratory results. TREATMENT  First, knowing the cause of the hyperglycemia is important before the hyperglycemia can be treated. Treatment may include, but is  not be limited to:  Education.  Change or adjustment in medications.  Change or adjustment in meal plan.  Treatment for an illness, infection, etc.  More frequent blood glucose monitoring.  Change in exercise plan.  Decreasing or stopping steroids.  Lifestyle changes. HOME CARE INSTRUCTIONS   Test your blood glucose as directed.  Exercise regularly. Your caregiver will give you instructions about exercise. Pre-diabetes or diabetes which comes on with stress is helped by exercising.  Eat wholesome, balanced meals. Eat often and at regular, fixed times. Your caregiver or nutritionist will give you a meal plan to guide your sugar intake.  Being at an ideal weight is important. If needed, losing as little as 10 to 15 pounds may help improve blood glucose levels. SEEK MEDICAL CARE IF:   You have questions about medicine, activity, or diet.  You continue to have symptoms (problems such as increased thirst, urination, or weight gain). SEEK IMMEDIATE MEDICAL CARE IF:   You are vomiting or have diarrhea.  Your breath smells fruity.  You are breathing faster or slower.  You are very sleepy or incoherent.  You have numbness, tingling, or pain in your feet or hands.  You have chest pain.  Your symptoms get worse even though you have been following your caregiver's orders.  If you have any other questions or concerns.   This information is not intended to replace advice given to you by your health care provider. Make sure you discuss any questions you have with your health care provider.   Document Released: 06/25/2000 Document Revised: 03/24/2011 Document Reviewed: 09/05/2014 Elsevier Interactive Patient Education 2016 Elsevier Inc[employer]  Urinary Tract Infection Urinary tract infections (UTIs) can develop anywhere along your urinary tract. Your urinary tract is your body's drainage system for removing wastes and extra water. Your urinary tract includes two kidneys, two ureters,  a bladder, and a urethra. Your kidneys are a pair of bean-shaped organs. Each kidney is about the size of your fist. They are located below your ribs, one on each side of your spine. CAUSES Infections are caused by microbes, which are microscopic organisms, including fungi, viruses, and bacteria. These organisms are so small that they can only be seen through a microscope. Bacteria are the microbes that most commonly cause UTIs. SYMPTOMS  Symptoms of UTIs may vary by age and gender of the patient and by the location of the infection. Symptoms in young women typically include a frequent and intense urge to urinate and a painful, burning feeling in the bladder or urethra during urination. Older women and men are more likely to be tired, shaky, and  weak and have muscle aches and abdominal pain. A fever may mean the infection is in your kidneys. Other symptoms of a kidney infection include pain in your back or sides below the ribs, nausea, and vomiting. DIAGNOSIS To diagnose a UTI, your caregiver will ask you about your symptoms. Your caregiver will also ask you to provide a urine sample. The urine sample will be tested for bacteria and white blood cells. White blood cells are made by your body to help fight infection. TREATMENT  Typically, UTIs can be treated with medication. Because most UTIs are caused by a bacterial infection, they usually can be treated with the use of antibiotics. The choice of antibiotic and length of treatment depend on your symptoms and the type of bacteria causing your infection. HOME CARE INSTRUCTIONS  If you were prescribed antibiotics, take them exactly as your caregiver instructs you. Finish the medication even if you feel better after you have only taken some of the medication.  Drink enough water and fluids to keep your urine clear or pale yellow.  Avoid caffeine, tea, and carbonated beverages. They tend to irritate your bladder.  Empty your bladder often. Avoid holding  urine for long periods of time.  Empty your bladder before and after sexual intercourse.  After a bowel movement, women should cleanse from front to back. Use each tissue only once. SEEK MEDICAL CARE IF:   You have back pain.  You develop a fever.  Your symptoms do not begin to resolve within 3 days. SEEK IMMEDIATE MEDICAL CARE IF:   You have severe back pain or lower abdominal pain.  You develop chills.  You have nausea or vomiting.  You have continued burning or discomfort with urination. MAKE SURE YOU:   Understand these instructions.  Will watch your condition.  Will get help right away if you are not doing well or get worse.   This information is not intended to replace advice given to you by your health care provider. Make sure you discuss any questions you have with your health care provider.   Document Released: 10/09/2004 Document Revised: 09/20/2014 Document Reviewed: 02/07/2011 Elsevier Interactive Patient Education [employer].

## 2015-03-06 LAB — URINE CULTURE

## 2015-03-07 ENCOUNTER — Telehealth (HOSPITAL_BASED_OUTPATIENT_CLINIC_OR_DEPARTMENT_OTHER): Payer: Self-pay | Admitting: Emergency Medicine

## 2015-03-07 NOTE — Telephone Encounter (Signed)
Post ED Visit - Positive Culture Follow-up  Culture report reviewed by antimicrobial stewardship pharmacist:  []  Elenor Quinones, Pharm.D. []  Heide Guile, Pharm.D., BCPS [x]  Parks Neptune, Pharm.D. []  Alycia Rossetti, Pharm.D., BCPS []  [place], [place].D., BCPS, AAHIVP []  Legrand Como, Pharm.D., BCPS, AAHIVP []  Milus Glazier, Pharm.D. []  Stephens November, Pharm.D.  Positive urine culture E coli Treated with cephalexin, organism sensitive to the same and no further patient follow-up is required at this time.  Hazle Nordmann 03/07/2015, 12:14 PM

## 2015-03-21 ENCOUNTER — Ambulatory Visit (HOSPITAL_COMMUNITY): Payer: Medicare PPO | Admitting: Hematology & Oncology

## 2015-04-20 ENCOUNTER — Encounter (HOSPITAL_COMMUNITY): Payer: Self-pay | Admitting: Hematology & Oncology

## 2015-04-20 ENCOUNTER — Encounter (HOSPITAL_COMMUNITY): Payer: Medicare PPO | Attending: Hematology & Oncology | Admitting: Hematology & Oncology

## 2015-04-20 DIAGNOSIS — C50319 Malignant neoplasm of lower-inner quadrant of unspecified female breast: Secondary | ICD-10-CM

## 2015-04-20 DIAGNOSIS — C50911 Malignant neoplasm of unspecified site of right female breast: Secondary | ICD-10-CM | POA: Diagnosis not present

## 2015-04-20 DIAGNOSIS — I2782 Chronic pulmonary embolism: Secondary | ICD-10-CM | POA: Insufficient documentation

## 2015-04-20 DIAGNOSIS — I2699 Other pulmonary embolism without acute cor pulmonale: Secondary | ICD-10-CM | POA: Diagnosis not present

## 2015-04-20 DIAGNOSIS — Z780 Asymptomatic menopausal state: Secondary | ICD-10-CM

## 2015-04-20 NOTE — Progress Notes (Signed)
[facility] [facility] [place], PA-C [address] [place] [address]  Right breast cancer T1bN0M0 ER Positive status post lumpectomy and sentinel node biopsy (07/24/2010)  Adjuvant Radiation Anastrozole initiated on 12/10/2010.  plans to continue therapy at least through November of 2017. DEXA 01/25/2014 with normal bone density Hospital admission on 01/15/2015 through 01/18/2015 for PE, cardiac strain noted on CT   CURRENT THERAPY: Arimidex  INTERVAL HISTORY: Alexa Castillo 65 y.o. female returns for follow-up of her ER+ early stage breast cancer.  Alexa Castillo returns to the [facility] alone today. She has stopped her Arimidex.  She notes that her pain is better; that her legs area  "whole lot better."   She is still on Xarelto. She thinks she will be left on it for 6 months. Alexa Castillo notes that she hasn't really been seeing anybody other than [place] here at the [facility].  She notes that she doesn't need anything else she can think of today.  Her appetite is good. [name] confirms she is up to date on her colonoscopy, with her most recent being the year before last.  She is no longer taking prednisone or those creams, noting "I only use them if my hands are irritated."  When asked about her family, she says everybody's doing well, and that they are treating her well, "so far, so good."  During the physical exam, she denies any chest pain and confirms that her breathing is "pretty good." She denies any pain when her belly is pushed. She confirms that her right leg is staying a little bit swollen, but denies that it's any worse at the end of the day.  Last mammogram was in May of last year.   MEDICAL HISTORY: Past Medical History  Diagnosis Date  . Diabetes mellitus   . Hypertension   . Obesity   . Low back pain   . Arthritis     back/saw Dr. Aline Brochure  . Sleep apnea 04/2013  . Cancer [facility])     breast  . Breast  cancer ([place])     has Right breast cancer T1bN0M0 ER Positive 2012; Osteoarthritis of hip; DDD (degenerative disc disease), lumbar; Weakness of right leg; Left leg weakness; Obstructive sleep apnea syndrome in adult; Pulmonary embolism ([place]); Arthritis; Hypertension; Chest pain on breathing; Dyspnea on exertion; Troponin level elevated; Essential hypertension; and PE (pulmonary thromboembolism) ([place]) on her problem list.     No history exists.     has No Known Allergies.  Alexa Castillo does not currently have medications on file.  SURGICAL HISTORY: Past Surgical History  Procedure Laterality Date  . Tubal ligation    . Biopsy breast      SOCIAL HISTORY: Social History   Social History  . Marital Status: Single    Spouse Name: N/A  . Number of Children: N/A  . Years of Education: N/A   Occupational History  . Not on file.   Social History Main Topics  . Smoking status: Former Smoker -- 1.50 packs/day for 17 years    Types: Cigarettes    Quit date: 12/18/1994  . Smokeless tobacco: Never Used  . Alcohol Use: No  . Drug Use: No  . Sexual Activity: Not on file   Other Topics Concern  . Not on file   Social History Narrative    FAMILY HISTORY: Family History  Problem Relation Age of Onset  . Liver cancer Mother   .  Cancer Mother   . Diabetes Mother   . Breast cancer Sister   . Cancer Sister     Review of Systems  Musculoskeletal: Positive for back pain and joint pain. Negative for myalgias, falls and neck pain.  All other systems reviewed and are negative. 14 point review of systems was performed and is negative except as detailed under history of present illness and above   PHYSICAL EXAMINATION  ECOG PERFORMANCE STATUS: 1 - Symptomatic but completely ambulatory  Physical Exam  Constitutional: She is oriented to person, place, and time and well-developed, well-nourished, and in no distress.  HENT:  Head: Normocephalic and atraumatic.  Nose: Nose normal.    Mouth/Throat: Oropharynx is clear and moist. No oropharyngeal exudate.  Eyes: Conjunctivae and EOM are normal. Pupils are equal, round, and reactive to light. Right eye exhibits no discharge. Left eye exhibits no discharge. No scleral icterus.  Neck: Normal range of motion. Neck supple. No tracheal deviation present. No thyromegaly present.  Cardiovascular: Normal rate, regular rhythm and normal heart sounds.  Exam reveals no gallop and no friction rub.   No murmur heard. Pulmonary/Chest: Effort normal and breath sounds normal. She has no wheezes. She has no rales.  Abdominal: Soft. Bowel sounds are normal. She exhibits no distension and no mass. There is no tenderness. There is no rebound and no guarding.  Musculoskeletal: Normal range of motion. She exhibits no edema.  Lymphadenopathy:    She has no cervical adenopathy.  Neurological: She is alert and oriented to person, place, and time. She has normal reflexes. No cranial nerve deficit. Gait normal. Coordination normal.  Skin: Skin is warm and dry. No rash noted.  Psychiatric: Mood, memory, affect and judgment normal.  Nursing note and vitals reviewed.   LABORATORY DATA: I have reviewed the data as listed  CBC    Component Value Date/Time   WBC 14.3* 03/02/2015 2209   RBC 4.99 03/02/2015 2209   HGB 14.6 03/02/2015 2209   HCT 43.8 03/02/2015 2209   PLT 215 03/02/2015 2209   MCV 87.8 03/02/2015 2209   MCH 29.3 03/02/2015 2209   MCHC 33.3 03/02/2015 2209   RDW 14.7 03/02/2015 2209   LYMPHSABS 3.3 01/15/2015 1632   MONOABS 0.9 01/15/2015 1632   EOSABS 0.2 01/15/2015 1632   BASOSABS 0.0 01/15/2015 1632   CMP     Component Value Date/Time   NA 134* 03/02/2015 2209   K 4.3 03/02/2015 2209   CL 99* 03/02/2015 2209   CO2 23 03/02/2015 2209   GLUCOSE 482* 03/02/2015 2209   BUN 15 03/02/2015 2209   CREATININE 0.92 03/02/2015 2209   CALCIUM 9.3 03/02/2015 2209   PROT 7.8 01/15/2015 1632   ALBUMIN 3.6 01/15/2015 1632   AST 28  01/15/2015 1632   ALT 27 01/15/2015 1632   ALKPHOS 96 01/15/2015 1632   BILITOT 0.5 01/15/2015 1632   GFRNONAA >60 03/02/2015 2209   GFRAA >60 03/02/2015 2209     ASSESSMENT and THERAPY PLAN:  Right breast cancer T1bN0M0 ER Positive Anastrozole initiated on 12/10/2010 plans to continue therapy at least through November of 2017. DEXA 01/25/2014 with normal bone density Mammogram 05/30/2014 Chronic LBP Hospital admission on 01/15/2015 through 01/18/2015 for PE, cardiac strain noted on CT  She was to continue her AI therapy through November but opted to discontinue because of joint pain. She is not interested in trying another therapy. BCI has not been performed. We can discuss this at follow-up.   I will bring her  back in 2 months to check on her Xarelto. She believes she started it in January, and will be close to stopping it. We will sit down and discuss when she needs to stop. I do feel she should have a hypercoag panel and discussed this with her today. PE was unprovoked.   She knows to let us know if anything comes up or she has any questions in the meantime.  No orders of the defined types were placed in this encounter.   All questions were answered. The patient knows to call the clinic with any problems, questions or concerns. We can certainly see the patient much sooner if necessary.   This document serves as a record of services personally performed by Ancil Linsey, MD. It was created on her behalf by Toni Amend, a trained medical scribe. The creation of this record is based on the scribe's personal observations and the provider's statements to them. This document has been checked and approved by the attending provider.  I have reviewed the above documentation for accuracy and completeness, and I agree with the above.  Kelby Fam. Whitney Muse, MD

## 2015-04-20 NOTE — Patient Instructions (Signed)
[place] at [facility] Discharge Instructions  RECOMMENDATIONS MADE BY THE CONSULTANT AND ANY TEST RESULTS WILL BE SENT TO YOUR REFERRING PHYSICIAN.  Exam done and seen today by Dr. Whitney Muse Everything looks good. Will talk about stopping Xerelto when you return for follow up Return to see the doctor in 2 months Please call the clinic if you have any questions or concerns   Thank you for choosing [place] at [facility] to provide your oncology and hematology care.  To afford each patient quality time with our provider, please arrive at least 15 minutes before your scheduled appointment time.   Beginning January 23rd 2017 lab work for the [facility] will be done in the  Main lab at [facility] on 1st floor. If you have a lab appointment with the [facility] please come in thru the  Main Entrance and check in at the main information desk  You need to re-schedule your appointment should you arrive 10 or more minutes late.  We strive to give you quality time with our providers, and arriving late affects you and other patients whose appointments are after yours.  Also, if you no show three or more times for appointments you may be dismissed from the clinic at the providers discretion.     Again, thank you for choosing [facility].  Our hope is that these requests will decrease the amount of time that you wait before being seen by our physicians.       _____________________________________________________________  Should you have questions after your visit to [facility], please contact our office at [phone] between the hours of 8:30 a.m. and 4:30 p.m.  Voicemails left after 4:30 p.m. will not be returned until the following business day.  For prescription refill requests, have your pharmacy contact our office.         Resources For Cancer Patients and their Caregivers ? American Cancer Society: Can  assist with transportation, wigs, general needs, runs Look Good Feel Better.        628 124 2248 ? Cancer Care: Provides financial assistance, online support groups, medication/co-pay assistance.  1-800-813-HOPE 317-461-2267) ? [place] Assists [place] cancer patients and their families through emotional , educational and financial support.  651-597-7088 ? [place] DSS Where to apply for food stamps, Medicaid and utility assistance. (438)634-6532 ? [facility]: Transportation to medical appointments. 931-506-5318 ? Social Security Administration: May apply for disability if have a Stage IV cancer. 808-803-0050 9051180611 ? [employer], Disability and Transit Services: Assists with nutrition, care and transit needs. [phone]

## 2015-04-30 ENCOUNTER — Ambulatory Visit (HOSPITAL_COMMUNITY): Payer: BLUE CROSS/BLUE SHIELD | Admitting: Hematology & Oncology

## 2015-05-20 ENCOUNTER — Encounter (HOSPITAL_COMMUNITY): Payer: Self-pay | Admitting: Hematology & Oncology

## 2015-05-25 ENCOUNTER — Other Ambulatory Visit (HOSPITAL_COMMUNITY): Payer: Self-pay | Admitting: Hematology & Oncology

## 2015-05-25 DIAGNOSIS — Z9889 Other specified postprocedural states: Secondary | ICD-10-CM

## 2015-06-05 ENCOUNTER — Ambulatory Visit (HOSPITAL_COMMUNITY)
Admission: RE | Admit: 2015-06-05 | Discharge: 2015-06-05 | Disposition: A | Discharge status: Home / Self Care | Payer: Medicare PPO | Source: Ambulatory Visit | Attending: Hematology & Oncology | Admitting: Hematology & Oncology

## 2015-06-05 DIAGNOSIS — Z9889 Other specified postprocedural states: Secondary | ICD-10-CM

## 2015-06-05 DIAGNOSIS — Z853 Personal history of malignant neoplasm of breast: Secondary | ICD-10-CM | POA: Diagnosis not present

## 2015-06-22 ENCOUNTER — Encounter (HOSPITAL_COMMUNITY): Payer: Medicare PPO | Attending: Hematology & Oncology | Admitting: Hematology & Oncology

## 2015-06-22 ENCOUNTER — Encounter (HOSPITAL_COMMUNITY): Payer: Self-pay | Admitting: Hematology & Oncology

## 2015-06-22 VITALS — BP 162/95 | HR 89 | Temp 98.1°F | Resp 92 | Wt 232.9 lb

## 2015-06-22 DIAGNOSIS — I2782 Chronic pulmonary embolism: Secondary | ICD-10-CM | POA: Insufficient documentation

## 2015-06-22 DIAGNOSIS — C50911 Malignant neoplasm of unspecified site of right female breast: Secondary | ICD-10-CM

## 2015-06-22 DIAGNOSIS — C50319 Malignant neoplasm of lower-inner quadrant of unspecified female breast: Secondary | ICD-10-CM

## 2015-06-22 NOTE — Patient Instructions (Signed)
[place] at [facility] Discharge Instructions  RECOMMENDATIONS MADE BY THE CONSULTANT AND ANY TEST RESULTS WILL BE SENT TO YOUR REFERRING PHYSICIAN.  Labs @ the end of July.  Return 1 week after labs at the end of July/first week in August.  Thank you for choosing [place] at [facility] to provide your oncology and hematology care.  To afford each patient quality time with our provider, please arrive at least 15 minutes before your scheduled appointment time.   Beginning January 23rd 2017 lab work for the [facility] will be done in the  Main lab at [facility] on 1st floor. If you have a lab appointment with the [facility] please come in thru the  Main Entrance and check in at the main information desk  You need to re-schedule your appointment should you arrive 10 or more minutes late.  We strive to give you quality time with our providers, and arriving late affects you and other patients whose appointments are after yours.  Also, if you no show three or more times for appointments you may be dismissed from the clinic at the providers discretion.     Again, thank you for choosing [facility].  Our hope is that these requests will decrease the amount of time that you wait before being seen by our physicians.       _____________________________________________________________  Should you have questions after your visit to [facility], please contact our office at [phone] between the hours of 8:30 a.m. and 4:30 p.m.  Voicemails left after 4:30 p.m. will not be returned until the following business day.  For prescription refill requests, have your pharmacy contact our office.         Resources For Cancer Patients and their Caregivers ? American Cancer Society: Can assist with transportation, wigs, general needs, runs Look Good Feel Better.        214-677-0616 ? Cancer Care: Provides financial  assistance, online support groups, medication/co-pay assistance.  1-800-813-HOPE 213-868-6605) ? [place] Assists [place] cancer patients and their families through emotional , educational and financial support.  802-734-7718 ? [place] DSS Where to apply for food stamps, Medicaid and utility assistance. ([phone] ? RCATS: Transportation to medical appointments. 475-886-1222 ? Social Security Administration: May apply for disability if have a Stage IV cancer. [phone] ? [employer], Disability and Transit Services: Assists with nutrition, care and transit needs. [place] Support Programs: @10RELATIVEDAYS @ > Cancer Support Group  2nd Tuesday of the month 1pm-2pm, Journey Room  > Creative Journey  3rd Tuesday of the month 1130am-1pm, Journey Room  > Look Good Feel Better  1st Wednesday of the month 10am-12 noon, Journey Room (Call [place] to register [phone])

## 2015-06-22 NOTE — Progress Notes (Signed)
[place] at [place] Not In System [address] [place] [address]  Right breast cancer T1bN0M0 ER Positive status post lumpectomy and sentinel node biopsy (07/24/2010)  Adjuvant Radiation Anastrozole initiated on 12/10/2010.  plans to continue therapy at least through November of 2017. DEXA 01/25/2014 with normal bone density Hospital admission on 01/15/2015 through 01/18/2015 for PE, cardiac strain noted on CT   CURRENT THERAPY: XARELTO  INTERVAL HISTORY: Alexa Castillo 65 y.o. female returns for follow-up of her ER+ early stage breast cancer.  Alexa Castillo returns to the [place] alone today. She has stopped her Arimidex.  She notes she's been going to therapy for her back on Wednesdays, and that the only day she had any complaints was Wednesday. She had no complaints prior to this.  She denies any bleeding or bruising on the Xarelto, or any other problems. She also confirms that she's up to date on her mammogram.  During the physical exam, she confirms that she's sleeping pretty good, and that her appetite is "too good." She denies any pain in her calves, commenting "my knees been hurting a little bit, but I know what that is."  She can't think of anything different she did or anything that happened before her clot.  She confirms that she started her Xarelto in January, and plans on being finished after she completes her current refill.     MEDICAL HISTORY: Past Medical History  Diagnosis Date  . Diabetes mellitus   . Hypertension   . Obesity   . Low back pain   . Arthritis     back/saw Dr. Aline Brochure  . Sleep apnea 04/2013  . Cancer [facility])     breast  . Breast cancer ([place])     has Right breast cancer T1bN0M0 ER Positive 2012; Osteoarthritis of hip; DDD (degenerative disc disease), lumbar; Weakness of right leg; Left leg weakness; Obstructive sleep apnea syndrome in adult; Pulmonary embolism ([place]); Arthritis;  Hypertension; Chest pain on breathing; Dyspnea on exertion; Troponin level elevated; Essential hypertension; and PE (pulmonary thromboembolism) ([place]) on her problem list.     No history exists.     has No Known Allergies.  Alexa Castillo does not currently have medications on file.  SURGICAL HISTORY: Past Surgical History  Procedure Laterality Date  . Tubal ligation    . Biopsy breast      SOCIAL HISTORY: Social History   Social History  . Marital Status: Single    Spouse Name: N/A  . Number of Children: N/A  . Years of Education: N/A   Occupational History  . Not on file.   Social History Main Topics  . Smoking status: Former Smoker -- 1.50 packs/day for 17 years    Types: Cigarettes    Quit date: 12/18/1994  . Smokeless tobacco: Never Used  . Alcohol Use: No  . Drug Use: No  . Sexual Activity: Not on file   Other Topics Concern  . Not on file   Social History Narrative    FAMILY HISTORY: Family History  Problem Relation Age of Onset  . Liver cancer Mother   . Cancer Mother   . Diabetes Mother   . Breast cancer Sister   . Cancer Sister     Review of Systems  Musculoskeletal: Positive for back pain and joint pain. Negative for myalgias, falls and neck pain.  All other systems reviewed and are negative. 14 point review of  systems was performed and is negative except as detailed under history of present illness and above   PHYSICAL EXAMINATION  ECOG PERFORMANCE STATUS: 1 - Symptomatic but completely ambulatory  Physical Exam  Constitutional: She is oriented to person, place, and time and well-developed, well-nourished, and in no distress.  HENT:  Head: Normocephalic and atraumatic.  Nose: Nose normal.  Mouth/Throat: Oropharynx is clear and moist. No oropharyngeal exudate.  Eyes: Conjunctivae and EOM are normal. Pupils are equal, round, and reactive to light. Right eye exhibits no discharge. Left eye exhibits no discharge. No scleral icterus.  Neck:  Normal range of motion. Neck supple. No tracheal deviation present. No thyromegaly present.  Cardiovascular: Normal rate, regular rhythm and normal heart sounds.  Exam reveals no gallop and no friction rub.   No murmur heard. Pulmonary/Chest: Effort normal and breath sounds normal. She has no wheezes. She has no rales.  Abdominal: Soft. Bowel sounds are normal. She exhibits no distension and no mass. There is no tenderness. There is no rebound and no guarding.  Musculoskeletal: Normal range of motion. She exhibits no edema.  Lymphadenopathy:    She has no cervical adenopathy.  Neurological: She is alert and oriented to person, place, and time. She has normal reflexes. No cranial nerve deficit. Gait normal. Coordination normal.  Skin: Skin is warm and dry. No rash noted.  Psychiatric: Mood, memory, affect and judgment normal.  Nursing note and vitals reviewed.   LABORATORY DATA: I have reviewed the data as listed  CBC    Component Value Date/Time   WBC 14.3* 03/02/2015 2209   RBC 4.99 03/02/2015 2209   HGB 14.6 03/02/2015 2209   HCT 43.8 03/02/2015 2209   PLT 215 03/02/2015 2209   MCV 87.8 03/02/2015 2209   MCH 29.3 03/02/2015 2209   MCHC 33.3 03/02/2015 2209   RDW 14.7 03/02/2015 2209   LYMPHSABS 3.3 01/15/2015 1632   MONOABS 0.9 01/15/2015 1632   EOSABS 0.2 01/15/2015 1632   BASOSABS 0.0 01/15/2015 1632   CMP     Component Value Date/Time   NA 134* 03/02/2015 2209   K 4.3 03/02/2015 2209   CL 99* 03/02/2015 2209   CO2 23 03/02/2015 2209   GLUCOSE 482* 03/02/2015 2209   BUN 15 03/02/2015 2209   CREATININE 0.92 03/02/2015 2209   CALCIUM 9.3 03/02/2015 2209   PROT 7.8 01/15/2015 1632   ALBUMIN 3.6 01/15/2015 1632   AST 28 01/15/2015 1632   ALT 27 01/15/2015 1632   ALKPHOS 96 01/15/2015 1632   BILITOT 0.5 01/15/2015 1632   GFRNONAA >60 03/02/2015 2209   GFRAA >60 03/02/2015 2209     ASSESSMENT and THERAPY PLAN:  Right breast cancer T1bN0M0 ER  Positive Anastrozole initiated on 12/10/2010 plans to continue therapy at least through November of 2017. DEXA 01/25/2014 with normal bone density Mammogram 05/30/2014 Chronic LBP Hospital admission on 01/15/2015 through 01/18/2015 for PE, cardiac strain noted on CT  She was to continue her AI therapy through November but opted to discontinue because of joint pain. She is not interested in trying another therapy. BCI has not been performed. At this point I see no relevance in sending as she is not interested in pursuing AI therapy.   She will complete XARELTO the second week of July then discontinue. We will complete her thrombophilia evaluation at that time.   She will return at the end of July to review her results. We discussed potential risks for recurrence of PE. We will address this  again at follow-up in a few weeks.   Orders Placed This Encounter  Procedures  . D-dimer, quantitative    Standing Status: Future     Number of Occurrences:      Standing Expiration Date: 06/21/2016  . Lupus anticoagulant panel    Standing Status: Future     Number of Occurrences:      Standing Expiration Date: 06/21/2016  . Protein C activity    Standing Status: Future     Number of Occurrences:      Standing Expiration Date: 06/21/2016  . Protein C, total    Standing Status: Future     Number of Occurrences:      Standing Expiration Date: 06/21/2016  . Protein S activity    Standing Status: Future     Number of Occurrences:      Standing Expiration Date: 06/21/2016  . Protein S, total    Standing Status: Future     Number of Occurrences:      Standing Expiration Date: 06/21/2016   All questions were answered. The patient knows to call the clinic with any problems, questions or concerns. We can certainly see the patient much sooner if necessary.   This document serves as a record of services personally performed by Ancil Linsey, MD. It was created on her behalf by Toni Amend, a trained medical  scribe. The creation of this record is based on the scribe's personal observations and the provider's statements to them. This document has been checked and approved by the attending provider.  I have reviewed the above documentation for accuracy and completeness, and I agree with the above.  Kelby Fam. Whitney Muse, MD

## 2015-06-23 ENCOUNTER — Telehealth (HOSPITAL_COMMUNITY): Payer: Self-pay | Admitting: *Deleted

## 2015-06-23 NOTE — Telephone Encounter (Signed)
BCI sent off today for patient. Fax confirmation obtained.

## 2015-07-09 ENCOUNTER — Encounter (HOSPITAL_COMMUNITY): Payer: Self-pay | Admitting: Hematology & Oncology

## 2015-07-12 ENCOUNTER — Encounter (HOSPITAL_COMMUNITY): Payer: Self-pay

## 2015-08-07 ENCOUNTER — Encounter (HOSPITAL_COMMUNITY): Payer: Medicare PPO | Attending: Hematology & Oncology

## 2015-08-07 DIAGNOSIS — C50319 Malignant neoplasm of lower-inner quadrant of unspecified female breast: Secondary | ICD-10-CM | POA: Insufficient documentation

## 2015-08-07 DIAGNOSIS — I2782 Chronic pulmonary embolism: Secondary | ICD-10-CM | POA: Insufficient documentation

## 2015-08-07 LAB — D-DIMER, QUANTITATIVE: D-Dimer, Quant: 1.38 ug/mL-FEU — ABNORMAL HIGH (ref 0.00–0.50)

## 2015-08-08 LAB — LUPUS ANTICOAGULANT PANEL
DRVVT: 44.7 s (ref 0.0–47.0)
PTT LA: 31.1 s (ref 0.0–51.9)

## 2015-08-08 LAB — PROTEIN S ACTIVITY: PROTEIN S ACTIVITY: 110 % (ref 63–140)

## 2015-08-08 LAB — PROTEIN C ACTIVITY: PROTEIN C ACTIVITY: 152 % (ref 73–180)

## 2015-08-08 LAB — PROTEIN S, TOTAL: Protein S Ag, Total: 115 % (ref 60–150)

## 2015-08-10 LAB — PROTEIN C, TOTAL: PROTEIN C, TOTAL: 118 % (ref 60–150)

## 2015-08-15 ENCOUNTER — Encounter (HOSPITAL_COMMUNITY): Payer: Self-pay | Admitting: Hematology & Oncology

## 2015-08-15 ENCOUNTER — Ambulatory Visit: Payer: Medicare PPO

## 2015-08-15 ENCOUNTER — Encounter (HOSPITAL_COMMUNITY): Payer: Medicare PPO | Attending: Hematology & Oncology | Admitting: Hematology & Oncology

## 2015-08-15 ENCOUNTER — Ambulatory Visit (HOSPITAL_COMMUNITY)
Admission: RE | Admit: 2015-08-15 | Discharge: 2015-08-15 | Disposition: A | Discharge status: Home / Self Care | Payer: Medicare PPO | Source: Ambulatory Visit | Attending: Hematology & Oncology | Admitting: Hematology & Oncology

## 2015-08-15 VITALS — BP 164/100 | HR 102 | Temp 98.5°F | Resp 16 | Wt 228.7 lb

## 2015-08-15 DIAGNOSIS — I10 Essential (primary) hypertension: Secondary | ICD-10-CM | POA: Diagnosis not present

## 2015-08-15 DIAGNOSIS — Z86711 Personal history of pulmonary embolism: Secondary | ICD-10-CM

## 2015-08-15 DIAGNOSIS — I2782 Chronic pulmonary embolism: Secondary | ICD-10-CM | POA: Diagnosis not present

## 2015-08-15 DIAGNOSIS — J432 Centrilobular emphysema: Secondary | ICD-10-CM | POA: Diagnosis not present

## 2015-08-15 DIAGNOSIS — C50319 Malignant neoplasm of lower-inner quadrant of unspecified female breast: Secondary | ICD-10-CM

## 2015-08-15 DIAGNOSIS — R0602 Shortness of breath: Secondary | ICD-10-CM

## 2015-08-15 DIAGNOSIS — R000 Tachycardia, unspecified: Secondary | ICD-10-CM

## 2015-08-15 DIAGNOSIS — I159 Secondary hypertension, unspecified: Secondary | ICD-10-CM

## 2015-08-15 DIAGNOSIS — C50911 Malignant neoplasm of unspecified site of right female breast: Secondary | ICD-10-CM

## 2015-08-15 DIAGNOSIS — R0902 Hypoxemia: Secondary | ICD-10-CM

## 2015-08-15 LAB — POCT I-STAT, CHEM 8
BUN: 11 mg/dL (ref 6–20)
CALCIUM ION: 1.21 mmol/L (ref 1.12–1.23)
CREATININE: 0.9 mg/dL (ref 0.44–1.00)
Chloride: 104 mmol/L (ref 101–111)
Glucose, Bld: 144 mg/dL — ABNORMAL HIGH (ref 65–99)
HEMATOCRIT: 45 % (ref 36.0–46.0)
HEMOGLOBIN: 15.3 g/dL — AB (ref 12.0–15.0)
Potassium: 4 mmol/L (ref 3.5–5.1)
Sodium: 142 mmol/L (ref 135–145)
TCO2: 24 mmol/L (ref 0–100)

## 2015-08-15 MED ORDER — IOPAMIDOL (ISOVUE-370) INJECTION 76%
100.0000 mL | Freq: Once | INTRAVENOUS | Status: AC | PRN
  Administered 2015-08-15: 100 mL via INTRAVENOUS

## 2015-08-15 NOTE — Patient Instructions (Signed)
[place] at [facility] Discharge Instructions  RECOMMENDATIONS MADE BY THE CONSULTANT AND ANY TEST RESULTS WILL BE SENT TO YOUR REFERRING PHYSICIAN.  Exam done and seen today by Dr. Youlanda Roys had a CT scan today, you still have some residual clot left. Will restart you on xarelto, call us when you almost run out so we can refill it. Return to see the doctor in 2 months Please call the clinic if you have any questions or concerns  Thank you for choosing [place] at [facility] to provide your oncology and hematology care.  To afford each patient quality time with our provider, please arrive at least 15 minutes before your scheduled appointment time.   Beginning January 23rd 2017 lab work for the [facility] will be done in the  Main lab at [facility] on 1st floor. If you have a lab appointment with the [facility] please come in thru the  Main Entrance and check in at the main information desk  You need to re-schedule your appointment should you arrive 10 or more minutes late.  We strive to give you quality time with our providers, and arriving late affects you and other patients whose appointments are after yours.  Also, if you no show three or more times for appointments you may be dismissed from the clinic at the providers discretion.     Again, thank you for choosing [facility].  Our hope is that these requests will decrease the amount of time that you wait before being seen by our physicians.       _____________________________________________________________  Should you have questions after your visit to [facility], please contact our office at [phone] between the hours of 8:30 a.m. and 4:30 p.m.  Voicemails left after 4:30 p.m. will not be returned until the following business day.  For prescription refill requests, have your pharmacy contact our office.         Resources For Cancer Patients  and their Caregivers ? American Cancer Society: Can assist with transportation, wigs, general needs, runs Look Good Feel Better.        [phone] ? Cancer Care: Provides financial assistance, online support groups, medication/co-pay assistance.  1-800-813-HOPE 248-218-3330) ? [place] Assists [place] cancer patients and their families through emotional , educational and financial support.  919-507-9989 ? [place] DSS Where to apply for food stamps, Medicaid and utility assistance. 787-454-1900 ? RCATS: Transportation to medical appointments. 424-566-0084 ? Social Security Administration: May apply for disability if have a Stage IV cancer. 540-380-2011 847-854-9860 ? [employer], Disability and Transit Services: Assists with nutrition, care and transit needs. [place] Support Programs: @10RELATIVEDAYS @ > Cancer Support Group  2nd Tuesday of the month 1pm-2pm, Journey Room  > Creative Journey  3rd Tuesday of the month 1130am-1pm, Journey Room  > Look Good Feel Better  1st Wednesday of the month 10am-12 noon, Journey Room (Call [place] to register [phone])

## 2015-08-15 NOTE — Progress Notes (Signed)
[place] at [place] Not In System [address] [place] [address]  Right breast cancer T1bN0M0 ER Positive status post lumpectomy and sentinel node biopsy (07/24/2010)  Adjuvant Radiation Anastrozole initiated on 12/10/2010.  plans to continue therapy at least through November of 2017. DEXA 01/25/2014 with normal bone density Hospital admission on 01/15/2015 through 01/18/2015 for PE, cardiac strain noted on CT   Right breast cancer T1bN0M0 ER Positive 2012   07/18/2010 Initial Diagnosis    Right breast cancer T1bN0M0 ER Positive 2012      07/11/2015 Pathology Results    BCI- 7.3% risk of late recurrence (5-10 years) HIGH risk, HIGH liklihood of benefit from extended endocrine therapy, with 16.5% absolute benefit, 13.5% risk of overall recurrence (0-10 years) intermediate risk, with high likelihood of benefit.        CURRENT THERAPY: None  INTERVAL HISTORY: Alexa Castillo 65 y.o. female returns for follow-up of her ER+ early stage breast cancer.  Alexa Castillo returns to the [place] alone today. She has stopped her Arimidex.  Alexa Castillo is unaccompanied. Resting oxygen saturation 88%. Blood pressure 164/100.   She has been off her blood thinner since early July.  She reports shortness of breath though no more than normal.  She denies chest pain.   MEDICAL HISTORY: Past Medical History:  Diagnosis Date  . Arthritis    back/saw Dr. Aline Brochure  . Breast cancer ([place])   . Cancer ([facility])    breast  . Diabetes mellitus   . Hypertension   . Low back pain   . Obesity   . Sleep apnea 04/2013    has Right breast cancer T1bN0M0 ER Positive 2012; Osteoarthritis of hip; DDD (degenerative disc disease), lumbar; Weakness of right leg; Left leg weakness; Obstructive sleep apnea syndrome in adult; Pulmonary embolism ([place]); Arthritis; Hypertension; Chest pain on breathing; Dyspnea on exertion; Troponin level elevated; Essential  hypertension; and PE (pulmonary thromboembolism) ([place]) on her problem list.     is allergic to lisinopril.  Alexa Castillo does not currently have medications on file.  SURGICAL HISTORY: Past Surgical History:  Procedure Laterality Date  . BIOPSY BREAST    . TUBAL LIGATION      SOCIAL HISTORY: Social History   Social History  . Marital status: Single    Spouse name: N/A  . Number of children: N/A  . Years of education: N/A   Occupational History  . Not on file.   Social History Main Topics  . Smoking status: Former Smoker    Packs/day: 1.50    Years: 17.00    Types: Cigarettes    Quit date: 12/18/1994  . Smokeless tobacco: Never Used  . Alcohol use No  . Drug use: No  . Sexual activity: Not on file   Other Topics Concern  . Not on file   Social History Narrative  . No narrative on file    FAMILY HISTORY: Family History  Problem Relation Age of Onset  . Liver cancer Mother   . Cancer Mother   . Diabetes Mother   . Breast cancer Sister   . Cancer Sister     Review of Systems  Musculoskeletal: Positive for back pain and joint pain. Negative for myalgias, falls and neck pain.  All other systems reviewed and are negative. 14 point review of systems was performed and is negative except as detailed under history of present illness and above  PHYSICAL EXAMINATION  ECOG PERFORMANCE STATUS: 1 - Symptomatic but completely ambulatory  Vitals with BMI 08/15/2015  Height   Weight 228 lbs 11 oz  BMI   Systolic 123456  Diastolic 123XX123  Pulse [id number]  Respirations 16    Physical Exam  Constitutional: She is oriented to person, place, and time and well-developed, well-nourished, and in no distress.  HENT:  Head: Normocephalic and atraumatic.  Nose: Nose normal.  Mouth/Throat: Oropharynx is clear and moist. No oropharyngeal exudate.  Eyes: Conjunctivae and EOM are normal. Pupils are equal, round, and reactive to light. Right eye exhibits no discharge. Left eye exhibits no  discharge. No scleral icterus.  Neck: Normal range of motion. Neck supple. No tracheal deviation present. No thyromegaly present.  Cardiovascular: Normal rate, regular rhythm and normal heart sounds.  Exam reveals no gallop and no friction rub.   No murmur heard. Pulmonary/Chest: Effort normal and breath sounds normal. She has no wheezes. She has no rales.  Abdominal: Soft. Bowel sounds are normal. She exhibits no distension and no mass. There is no tenderness. There is no rebound and no guarding.  Musculoskeletal: Normal range of motion. She exhibits no edema.  Lymphadenopathy:    She has no cervical adenopathy.  Neurological: She is alert and oriented to person, place, and time. She has normal reflexes. No cranial nerve deficit. Gait normal. Coordination normal.  Skin: Skin is warm and dry. No rash noted.  Psychiatric: Mood, memory, affect and judgment normal.  Nursing note and vitals reviewed.   LABORATORY DATA: I have reviewed the data as listed  CBC    Component Value Date/Time   WBC 14.3 (H) 03/02/2015 2209   RBC 4.99 03/02/2015 2209   HGB 14.6 03/02/2015 2209   HCT 43.8 03/02/2015 2209   PLT 215 03/02/2015 2209   MCV 87.8 03/02/2015 2209   MCH 29.3 03/02/2015 2209   MCHC 33.3 03/02/2015 2209   RDW 14.7 03/02/2015 2209   LYMPHSABS 3.3 01/15/2015 1632   MONOABS 0.9 01/15/2015 1632   EOSABS 0.2 01/15/2015 1632   BASOSABS 0.0 01/15/2015 1632   CMP     Component Value Date/Time   NA 134 (L) 03/02/2015 2209   K 4.3 03/02/2015 2209   CL 99 (L) 03/02/2015 2209   CO2 23 03/02/2015 2209   GLUCOSE 482 (H) 03/02/2015 2209   BUN 15 03/02/2015 2209   CREATININE 0.92 03/02/2015 2209   CALCIUM 9.3 03/02/2015 2209   PROT 7.8 01/15/2015 1632   ALBUMIN 3.6 01/15/2015 1632   AST 28 01/15/2015 1632   ALT 27 01/15/2015 1632   ALKPHOS 96 01/15/2015 1632   BILITOT 0.5 01/15/2015 1632   GFRNONAA >60 03/02/2015 2209   GFRAA >60 03/02/2015 2209   RADIOGRAPHIC STUDIES: I have  personally reviewed the radiological images as listed and agreed with the findings in the report. Study Result   CLINICAL DATA:  Status post right lumpectomy and radiation therapy for breast cancer in 2012.  EXAM: 2D DIGITAL DIAGNOSTIC BILATERAL MAMMOGRAM WITH CAD AND ADJUNCT TOMO  COMPARISON:  Previous exam(s).  ACR Breast Density Category a: The breast tissue is almost entirely fatty.  FINDINGS: Stable post lumpectomy changes on the right. On the left, there is a small group of tiny calcifications in the central, inferior portion of the breast. These demonstrate some dependent layering on spot magnification views and in comparison to previous examinations, compatible with benign milk of calcium. There are no findings suspicious for malignancy in either breast.  Mammographic images  were processed with CAD.  IMPRESSION: No evidence of malignancy.  RECOMMENDATION: Bilateral diagnostic mammogram in 1 year.  I have discussed the findings and recommendations with the patient. Results were also provided in writing at the conclusion of the visit. If applicable, a reminder letter will be sent to the patient regarding the next appointment.  BI-RADS CATEGORY  2: Benign.   Electronically Signed   By: Claudie Revering M.D.   On: 06/05/2015 11:24     ASSESSMENT and THERAPY PLAN:  Right breast cancer T1bN0M0 ER Positive Anastrozole initiated on 12/10/2010 plans to continue therapy at least through November of 2017. DEXA 01/25/2014 with normal bone density Mammogram 05/30/2014 Chronic LBP Hospital admission on 01/15/2015 through 01/18/2015 for PE, cardiac strain noted on CT Hypertension Tachycardia Hypoxia  She was to continue her AI therapy through November but opted to discontinue because of joint pain. She is not interested in trying another therapy. BCI has not been performed. At this point I see no relevance in sending as she is not interested in pursuing AI therapy.    She completed XARELTO in July. Thrombophilia evaluation was negative  At presentation Initial resting oxygen saturation 88%, tachycardia and BP was 164/100. Based on these vitals and prior PE history, the patient has been sent to the ED. D-dimer today is elevated as well. CTA chest recommended as well.   Results for BREYANNA, CALDON (MRN [id number]) as of 08/15/2015 10:43  Ref. Range 01/15/2015 16:55 01/29/2015 13:30 08/07/2015 13:10  D-Dimer, Quant Latest Ref Range: 0.00 - 0.50 ug/mL-FEU 3.33 (H) 0.51 (H) 1.38 (H)    Last mammogram performed on 06/05/15.  She will return for follow up in several weeks pending the results of ED visit and CTA.   Orders Placed This Encounter  Procedures  . CT ANGIO CHEST PE W OR WO CONTRAST    Standing Status:   Future    Number of Occurrences:   1    Standing Expiration Date:   11/12/2016    Order Specific Question:   Reason for Exam (SYMPTOM  OR DIAGNOSIS REQUIRED)    Answer:   prior PE, O2 sat in clinic 88%, SOB    Order Specific Question:   Preferred imaging location?    Answer:   [facility]    Order Specific Question:   Call Results- Best Contact Number?    Answer:   [phone]     All questions were answered. The patient knows to call the clinic with any problems, questions or concerns. We can certainly see the patient much sooner if necessary.   This document serves as a record of services personally performed by Ancil Linsey, MD. It was created on her behalf by Arlyce Harman, a trained medical scribe. The creation of this record is based on the scribe's personal observations and the provider's statements to them. This document has been checked and approved by the attending provider.  I have reviewed the above documentation for accuracy and completeness, and I agree with the above.  Kelby Fam. Whitney Muse, MD

## 2015-09-09 ENCOUNTER — Encounter (HOSPITAL_COMMUNITY): Payer: Self-pay | Admitting: Hematology & Oncology

## 2015-10-16 ENCOUNTER — Encounter (HOSPITAL_COMMUNITY): Payer: Medicare PPO | Attending: Hematology & Oncology | Admitting: Hematology & Oncology

## 2015-10-16 ENCOUNTER — Encounter (HOSPITAL_COMMUNITY): Payer: Self-pay | Admitting: Hematology & Oncology

## 2015-10-16 VITALS — BP 146/79 | HR 94 | Temp 98.1°F | Resp 18 | Wt 227.8 lb

## 2015-10-16 DIAGNOSIS — G8929 Other chronic pain: Secondary | ICD-10-CM | POA: Diagnosis not present

## 2015-10-16 DIAGNOSIS — C50911 Malignant neoplasm of unspecified site of right female breast: Secondary | ICD-10-CM | POA: Diagnosis not present

## 2015-10-16 DIAGNOSIS — M2550 Pain in unspecified joint: Secondary | ICD-10-CM

## 2015-10-16 DIAGNOSIS — I2699 Other pulmonary embolism without acute cor pulmonale: Secondary | ICD-10-CM

## 2015-10-16 DIAGNOSIS — I10 Essential (primary) hypertension: Secondary | ICD-10-CM

## 2015-10-16 DIAGNOSIS — C50319 Malignant neoplasm of lower-inner quadrant of unspecified female breast: Secondary | ICD-10-CM

## 2015-10-16 DIAGNOSIS — I2782 Chronic pulmonary embolism: Secondary | ICD-10-CM | POA: Insufficient documentation

## 2015-10-16 MED ORDER — RIVAROXABAN 20 MG PO TABS: 20.0000 mg | ORAL_TABLET | Freq: Every day | ORAL | 6 refills | Status: DC

## 2015-10-16 MED ORDER — TRAMADOL HCL 50 MG PO TABS: 50.0000 mg | ORAL_TABLET | Freq: Four times a day (QID) | ORAL | 2 refills | Status: DC | PRN

## 2015-10-16 NOTE — Assessment & Plan Note (Addendum)
Hospital admission on 01/15/2015 through 01/18/2015 for PE, cardiac strain noted on CT. She completed 6 months of XARELTO in July. Thrombophilia evaluation was negative. She presented to our office hypoxemic and tachpneic, repeat CTA on 08/15/2015 still showed residual pulmonary emboli. She continues on XARELTO. She notes her breathing is much improved. Plan is to continue with XARELTO. I have called her in refills today to her pharmacy

## 2015-10-16 NOTE — Assessment & Plan Note (Addendum)
Right breast cancer T1bN0M0 ER Positive, began arimidex 12/10/2010. She stopped arimidex earlier this year secondary to joint pain and opted not to pursue other endocrine therapy. BCI has not been performed. She is up to date on mammography, with last mammogram being performed in May 2017.

## 2015-10-16 NOTE — Patient Instructions (Addendum)
[place] at [facility] Discharge Instructions  RECOMMENDATIONS MADE BY THE CONSULTANT AND ANY TEST RESULTS WILL BE SENT TO YOUR REFERRING PHYSICIAN.  You saw Dr. Whitney Muse today. You are being referred to neurology  For the numbness in your fingers and hand. Follow up in 4 months with labs.  Thank you for choosing [place] at [facility] to provide your oncology and hematology care.  To afford each patient quality time with our provider, please arrive at least 15 minutes before your scheduled appointment time.   Beginning January 23rd 2017 lab work for the [facility] will be done in the  Main lab at [facility] on 1st floor. If you have a lab appointment with the [facility] please come in thru the  Main Entrance and check in at the main information desk  You need to re-schedule your appointment should you arrive 10 or more minutes late.  We strive to give you quality time with our providers, and arriving late affects you and other patients whose appointments are after yours.  Also, if you no show three or more times for appointments you may be dismissed from the clinic at the providers discretion.     Again, thank you for choosing [facility].  Our hope is that these requests will decrease the amount of time that you wait before being seen by our physicians.       _____________________________________________________________  Should you have questions after your visit to [facility], please contact our office at [phone] between the hours of 8:30 a.m. and 4:30 p.m.  Voicemails left after 4:30 p.m. will not be returned until the following business day.  For prescription refill requests, have your pharmacy contact our office.         Resources For Cancer Patients and their Caregivers ? American Cancer Society: Can assist with transportation, wigs, general needs, runs Look Good Feel Better.         641-278-6114 ? Cancer Care: Provides financial assistance, online support groups, medication/co-pay assistance.  1-800-813-HOPE 484-001-6547) ? [facility] Assists [place] cancer patients and their families through emotional , educational and financial support.  ([phone] [phone] ? [place] DSS Where to apply for food stamps, Medicaid and utility assistance. [phone] ? RCATS: Transportation to medical appointments. 412-351-6392 ? Social Security Administration: May apply for disability if have a Stage IV cancer. 207-709-5871 302-248-9415 ? [employer], Disability and Transit Services: Assists with nutrition, care and transit needs. [place] Support Programs: @10RELATIVEDAYS @ > Cancer Support Group  2nd Tuesday of the month 1pm-2pm, Journey Room  > Creative Journey  3rd Tuesday of the month 1130am-1pm, Journey Room  > Look Good Feel Better  1st Wednesday of the month 10am-12 noon, Journey Room (Call [place] to register [phone])

## 2015-10-16 NOTE — [place] Notes (Signed)
[place] at [facility] [address] [place] [address]  Right breast cancer T1bN0M0 ER Positive status post lumpectomy and sentinel node biopsy (07/24/2010)  Adjuvant Radiation Anastrozole initiated on 12/10/2010.  plans to continue therapy at least through November of 2017. DEXA 01/25/2014 with normal bone density Hospital admission on 01/15/2015 through 01/18/2015 for PE, cardiac strain noted on CT   Right breast cancer T1bN0M0 ER Positive 2012   07/18/2010 Initial Diagnosis    Right breast cancer T1bN0M0 ER Positive 2012      07/11/2015 Pathology Results    BCI- 7.3% risk of late recurrence (5-10 years) HIGH risk, HIGH liklihood of benefit from extended endocrine therapy, with 16.5% absolute benefit, 13.5% risk of overall recurrence (0-10 years) intermediate risk, with high likelihood of benefit.        CURRENT THERAPY: None  INTERVAL HISTORY: Alexa Castillo 65 y.o. female returns for follow-up of her ER+ early stage breast cancer.  Patient is back on blood thinner (Xarelto) due to blood clots in her lungs, but otherwise is well.   She reports some neuropathy in first and second digits only. She also reports musculoskeletal pain from arthritis. She says she has not been able to take any pain reliever other than Tylenol, because she is on Xarelto. However, Tylenol is not alleviating her pain. [name] no longer experiences dyspnea. She denies hematochezia and hematuria.  Her mammogram is up to date.  Patient needs refill for Xarelto 20 mg.    MEDICAL HISTORY: Past Medical History:  Diagnosis Date  . Arthritis    back/saw Dr. Aline Brochure  . Breast cancer ([place])   . Cancer (HCC)    breast  . Diabetes mellitus   . Hypertension   . Low back pain   . Obesity   . Sleep apnea 04/2013    has Right breast cancer T1bN0M0 ER Positive 2012; Osteoarthritis of hip; DDD (degenerative disc disease), lumbar; Weakness of  right leg; Left leg weakness; Obstructive sleep apnea syndrome in adult; Pulmonary embolism ([place]); Arthritis; Hypertension; Chest pain on breathing; Dyspnea on exertion; Troponin level elevated; Essential hypertension; and PE (pulmonary thromboembolism) ([place]) on her problem list.     is allergic to lisinopril.  [name] does not currently have medications on file.  SURGICAL HISTORY: Past Surgical History:  Procedure Laterality Date  . BIOPSY BREAST    . TUBAL LIGATION      SOCIAL HISTORY: Social History   Social History  . Marital status: Single    Spouse name: N/A  . Number of children: N/A  . Years of education: N/A   Occupational History  . Not on file.   Social History Main Topics  . Smoking status: Former Smoker    Packs/day: 1.50    Years: 17.00    Types: Cigarettes    Quit date: 12/18/1994  . Smokeless tobacco: Never Used  . Alcohol use No  . Drug use: No  . Sexual activity: Not on file   Other Topics Concern  . Not on file   Social History Narrative  . No narrative on file    FAMILY HISTORY: Family History  Problem Relation Age of Onset  . Liver cancer Mother   . Cancer Mother   . Diabetes Mother   . Breast cancer Sister   . Cancer Sister     Review of Systems  Musculoskeletal: Positive for back pain, joint pain, and  nerve pain. Negative for myalgias, falls and neck pain.  Tingling in first and second digits All other systems reviewed and are negative. 14 point review of systems was performed and is negative except as detailed under history of present illness and above   PHYSICAL EXAMINATION  ECOG PERFORMANCE STATUS: 1 - Symptomatic but completely ambulatory Vitals with BMI 10/16/2015  Height   Weight 227 lbs 13 oz  BMI   Systolic 123456  Diastolic 79  Pulse 94  Respirations 18   Physical Exam  Constitutional: She is oriented to person, place, and time and well-developed, well-nourished, and in no distress.  Right leg is larger than left  leg; chronic HENT:  Head: Normocephalic and atraumatic.  Nose: Nose normal.  Mouth/Throat: Oropharynx is clear and moist. No oropharyngeal exudate.  Eyes: Conjunctivae and EOM are normal. Pupils are equal, round, and reactive to light. Right eye exhibits no discharge. Left eye exhibits no discharge. No scleral icterus.  Neck: Normal range of motion. Neck supple. No tracheal deviation present. No thyromegaly present.  Cardiovascular: Normal rate, regular rhythm and normal heart sounds.  Exam reveals no gallop and no friction rub.   No murmur heard. Pulmonary/Chest: Effort normal and breath sounds normal. She has no wheezes. She has no rales.  Abdominal: Soft. Bowel sounds are normal. She exhibits no distension and no mass. There is no tenderness. There is no rebound and no guarding.  Musculoskeletal: Normal range of motion. She exhibits no edema.  Lymphadenopathy:    She has no cervical adenopathy.  Neurological: She is alert and oriented to person, place, and time. She has normal reflexes. No cranial nerve deficit. Gait normal. Coordination normal.  Skin: Skin is warm and dry. No rash noted.  Psychiatric: Mood, memory, affect and judgment normal.  Nursing note and vitals reviewed.   LABORATORY DATA: I have reviewed the data as listed  CBC    Component Value Date/Time   WBC 14.3 (H) 03/02/2015 2209   RBC 4.99 03/02/2015 2209   HGB 15.3 (H) 08/15/2015 1127   HCT 45.0 08/15/2015 1127   PLT 215 03/02/2015 2209   MCV 87.8 03/02/2015 2209   MCH 29.3 03/02/2015 2209   MCHC 33.3 03/02/2015 2209   RDW 14.7 03/02/2015 2209   LYMPHSABS 3.3 01/15/2015 1632   MONOABS 0.9 01/15/2015 1632   EOSABS 0.2 01/15/2015 1632   BASOSABS 0.0 01/15/2015 1632   CMP     Component Value Date/Time   NA 142 08/15/2015 1127   K 4.0 08/15/2015 1127   CL 104 08/15/2015 1127   CO2 23 03/02/2015 2209   GLUCOSE 144 (H) 08/15/2015 1127   BUN 11 08/15/2015 1127   CREATININE 0.90 08/15/2015 1127   CALCIUM  9.3 03/02/2015 2209   PROT 7.8 01/15/2015 1632   ALBUMIN 3.6 01/15/2015 1632   AST 28 01/15/2015 1632   ALT 27 01/15/2015 1632   ALKPHOS 96 01/15/2015 1632   BILITOT 0.5 01/15/2015 1632   GFRNONAA >60 03/02/2015 2209   GFRAA >60 03/02/2015 2209   RADIOGRAPHIC STUDIES: I have personally reviewed the radiological images as listed and agreed with the findings in the report. Study Result   CLINICAL DATA:  Short of breath for week, history of prior pulmonary embolism, history of right breast carcinoma with lumpectomy in 2012  EXAM: CT ANGIOGRAPHY CHEST WITH CONTRAST  TECHNIQUE: Multidetector CT imaging of the chest was performed using the standard protocol during bolus administration of intravenous contrast. Multiplanar CT image reconstructions and MIPs were obtained  to evaluate the vascular anatomy.  CONTRAST:  100 cc Isovue 370  COMPARISON:  CT angio chest of 01/15/2015  FINDINGS: There is moderate opacification of the pulmonary arteries. However unfortunately, there is significant amount of motion particularly obscuring the lung bases and limiting resolution. Compared to the prior CT, there is do appear to be nonocclusive linear filling defects within branches of the pulmonary arteries to both the left and the right upper lobes. These may be sequelae of prior pulmonary emboli which are resolving, leaving some linear stranding present. Again no large central embolus is seen on the best images obtained. No occlusive embolism is evident. The thoracic aorta opacifies with no significant abnormality. The ascending thoracic aorta measures 33 mm in diameter. A slightly prominent precarinal node is stable measuring 12 mm in short axis diameter. Minimal prominence of hilar nodes also appears stable. No significant coronary artery calcifications are seen and the heart is borderline enlarged. The thyroid gland is unremarkable.  On lung window images, no parenchymal infiltrate  is seen and no suspicious lung nodule or mass is noted. There is no evidence of pleural effusion. The central airway is patent changes of centrilobular emphysema are noted. There is degenerative change throughout the mid to lower thoracic spine. No compression deformity is seen.  Review of the MIP images confirms the above findings.  IMPRESSION: 1. The previously noted pulmonary emboli have largely resolved with a few remaining linear opacities within branches to the upper lobes which are nonocclusive most likely representing resolving pulmonary emboli. No new or occlusive pulmonary emboli are seen. 2. Centrilobular emphysema. 3. Unfortunately there is a considerable amount of motion obscuring much of the lung bases but no clinically significant central embolus is seen.   Electronically Signed   By: Ivar Drape M.D.   On: 08/15/2015 12:03     ASSESSMENT and THERAPY PLAN:  Right breast cancer T1bN0M0 ER Positive Anastrozole initiated on 12/10/2010 plans to continue therapy at least through November of 2017. DEXA 01/25/2014 with normal bone density Mammogram 05/30/2014 Chronic LBP Hospital admission on 01/15/2015 through 01/18/2015 for PE, cardiac strain noted on CT Hypertension Tachycardia Hypoxia  Right breast cancer T1bN0M0 ER Positive 2012 Right breast cancer T1bN0M0 ER Positive, began arimidex 12/10/2010. She stopped arimidex earlier this year secondary to joint pain and opted not to pursue other endocrine therapy. BCI has not been performed. She is up to date on mammography, with last mammogram being performed in May 2017.    PE (pulmonary thromboembolism) [facility]) Hospital admission on 01/15/2015 through 01/18/2015 for PE, cardiac strain noted on CT. She completed 6 months of XARELTO in July. Thrombophilia evaluation was negative. She presented to our office hypoxemic and tachpneic, repeat CTA on 08/15/2015 still showed residual pulmonary emboli. She continues on XARELTO. She  notes her breathing is much improved. Plan is to continue with XARELTO. I have called her in refills today to her pharmacy   Chronic joint pain She was given a prescription for tramadol. She had previously taken 800mg  ibuprofen which helped but notes she can no longer take while on XARELTO.   Will do breast exam when patient comes back for follow up in four months.   All questions were answered. The patient knows to call the clinic with any problems, questions or concerns. We can certainly see the patient much sooner if necessary.   This document serves as a record of services personally performed by Ancil Linsey, MD. It was created on her behalf by Elmyra Ricks, a  trained medical scribe. The creation of this record is based on the scribe's personal observations and the provider's statements to them. This document has been checked and approved by the attending provider.  I have reviewed the above documentation for accuracy and completeness, and I agree with the above.  [place] Fam. Whitney Muse, MD

## 2015-10-16 NOTE — Assessment & Plan Note (Signed)
She was given a prescription for tramadol. She had previously taken 800mg  ibuprofen which helped but notes she can no longer take while on XARELTO.

## 2015-10-17 ENCOUNTER — Encounter (HOSPITAL_COMMUNITY): Payer: Self-pay | Admitting: Lab

## 2015-10-17 NOTE — Progress Notes (Unsigned)
Referral to Dr Merlene Laughter ( neurology),  Records faxed on 10/4.  They will contact patient

## 2015-11-27 ENCOUNTER — Encounter (HOSPITAL_COMMUNITY): Payer: Self-pay | Admitting: Emergency Medicine

## 2015-11-27 ENCOUNTER — Emergency Department (HOSPITAL_COMMUNITY)
Admission: EM | Admit: 2015-11-27 | Discharge: 2015-11-27 | Disposition: A | Discharge status: Home / Self Care | Payer: Medicare PPO | Attending: Emergency Medicine | Admitting: Emergency Medicine

## 2015-11-27 DIAGNOSIS — H5712 Ocular pain, left eye: Secondary | ICD-10-CM | POA: Diagnosis present

## 2015-11-27 DIAGNOSIS — I10 Essential (primary) hypertension: Secondary | ICD-10-CM | POA: Diagnosis not present

## 2015-11-27 DIAGNOSIS — Z87891 Personal history of nicotine dependence: Secondary | ICD-10-CM | POA: Insufficient documentation

## 2015-11-27 DIAGNOSIS — Z79899 Other long term (current) drug therapy: Secondary | ICD-10-CM | POA: Insufficient documentation

## 2015-11-27 DIAGNOSIS — E119 Type 2 diabetes mellitus without complications: Secondary | ICD-10-CM | POA: Diagnosis not present

## 2015-11-27 DIAGNOSIS — Z7984 Long term (current) use of oral hypoglycemic drugs: Secondary | ICD-10-CM | POA: Diagnosis not present

## 2015-11-27 DIAGNOSIS — H1132 Conjunctival hemorrhage, left eye: Secondary | ICD-10-CM | POA: Insufficient documentation

## 2015-11-27 DIAGNOSIS — Z853 Personal history of malignant neoplasm of breast: Secondary | ICD-10-CM | POA: Insufficient documentation

## 2015-11-27 NOTE — ED Triage Notes (Signed)
Pt reports redness in her L eye that started 4-5 days ago. Pt was seen at Urgent Care today and sent for possible bleeding in the eye.

## 2015-11-27 NOTE — ED Provider Notes (Signed)
[place] DEPT Provider Note   CSN: [id number] Arrival date & time: 11/27/15  1710     History   Chief Complaint Chief Complaint  Patient presents with  . Eye Pain    HPI Alexa Castillo is a 65 y.o. female.  HPI Patient has had some redness of her left eye for the last 5 days. No vision change. States the redness is been about the same over that time. Get seen in urgent care and sent in for bleeding in her eye. No injury. She is however on Xarelto. No pain.   Past Medical History:  Diagnosis Date  . Arthritis    back/saw Dr. Aline Brochure  . Breast cancer ([place])   . Cancer (HCC)    breast  . Diabetes mellitus   . Hypertension   . Low back pain   . Obesity   . Sleep apnea 04/2013    Patient Active Problem List   Diagnosis Date Noted  . Chronic joint pain 10/16/2015  . Pulmonary embolism ([place]) 01/15/2015  . Chest pain on breathing 01/15/2015  . Dyspnea on exertion 01/15/2015  . Troponin level elevated 01/15/2015  . Arthritis   . Hypertension   . Essential hypertension   . PE (pulmonary thromboembolism) ([place])   . Obstructive sleep apnea syndrome in adult 07/20/2013  . Weakness of right leg 03/11/2011  . Left leg weakness 03/11/2011  . Osteoarthritis of hip 03/08/2011  . DDD (degenerative disc disease), lumbar 03/08/2011  . Right breast cancer T1bN0M0 ER Positive 2012 07/18/2010    Past Surgical History:  Procedure Laterality Date  . BIOPSY BREAST    . TUBAL LIGATION      OB History    Gravida Para Term Preterm AB Living   3 2 2   1 2    SAB TAB Ectopic Multiple Live Births   1               Home Medications    Prior to Admission medications   Medication Sig Start Date End Date Taking? Authorizing Provider  calcium-vitamin D (OSCAL WITH D) 500-200 MG-UNIT per tablet Take 1 tablet by mouth 2 (two) times daily.    Historical Provider, MD  cephALEXin (KEFLEX) 500 MG capsule Take 1 capsule (500 mg total) by mouth 4 (four) times daily. 03/03/15    Kristen N Ward, DO  glipiZIDE (GLUCOTROL) 10 MG tablet Take 10 mg by mouth 2 (two) times daily before a meal.  06/18/10   Historical Provider, MD  metFORMIN (GLUCOPHAGE) 1000 MG tablet Take 1,000 mg by mouth 2 (two) times daily. 06/20/14   Historical Provider, MD  metoprolol succinate (TOPROL-XL) 25 MG 24 hr tablet Take 25 mg by mouth daily. 06/20/14   Historical Provider, MD  Omega-3 Fatty Acids (FISH OIL) 1000 MG CPDR Take 1,000 mg by mouth daily.     Historical Provider, MD  omeprazole (PRILOSEC) 20 MG capsule Take 20 mg by mouth every other day. 04/18/13   Historical Provider, MD  rivaroxaban (XARELTO) 20 MG TABS tablet Take 1 tablet (20 mg total) by mouth daily. 10/16/15   Patrici Ranks, MD  traMADol (ULTRAM) 50 MG tablet Take 1 tablet (50 mg total) by mouth every 6 (six) hours as needed. 10/16/15   Patrici Ranks, MD    Family History Family History  Problem Relation Age of Onset  . Liver cancer Mother   . Cancer Mother   . Diabetes Mother   . Breast cancer Sister   . Cancer  Sister     Social History Social History  Substance Use Topics  . Smoking status: Former Smoker    Packs/day: 1.50    Years: 17.00    Types: Cigarettes    Quit date: 12/18/1994  . Smokeless tobacco: Never Used  . Alcohol use No     Allergies   Lisinopril   Review of Systems Review of Systems  Eyes: Positive for redness. Negative for photophobia, pain, discharge, itching and visual disturbance.  Hematological: Bruises/bleeds easily.     Physical Exam Updated Vital Signs BP 145/75 (BP Location: Left Arm)   Pulse 103   Temp 98.6 F (37 C) (Oral)   Resp 20   Ht 5\' 2"  (1.575 m)   Wt 230 lb (104.3 kg)   SpO2 94%   BMI 42.07 kg/m   Physical Exam  Constitutional: She appears well-developed.  HENT:  Head: Atraumatic.  Eyes:  Subconjunctival hemorrhage of the left lateral eye. No hyphema. Pupils reactive. No tenderness.     ED Treatments / Results  Labs (all labs ordered are listed, but  only abnormal results are displayed) Labs Reviewed - No data to display  EKG  EKG Interpretation None       Radiology No results found.  Procedures Procedures (including critical care time)  Medications Ordered in ED Medications - No data to display   Initial Impression / Assessment and Plan / ED Course  I have reviewed the triage vital signs and the nursing notes.  Pertinent labs & imaging results that were available during my care of the patient were reviewed by me and considered in my medical decision making (see chart for details).  Clinical Course     Patient was some conjunctival hemorrhage. She is on Xarelto but otherwise there is no pain or vision changes. Has been stable over last 5 days. She states she has an eye doctor. She'll follow-up with him as needed. Discharge home. Does not appear to need acute intervention at this time.   Final Clinical Impressions(s) / ED Diagnoses   Final diagnoses:  Subconjunctival hemorrhage of left eye    New Prescriptions New Prescriptions   No medications on file     Davonna Belling, MD 11/27/15 1945

## 2015-11-27 NOTE — Discharge Instructions (Signed)
Follow-up with her eye doctor as needed. Follow up sooner with increased bleeding or swelling. Follow-up for increased pain or if vision changes develop.

## 2015-11-27 NOTE — ED Notes (Signed)
Pt reports she had blood in her L eye 4-5 days ago, denies increase in bleeding or vision loss. Pt is on Xarelto, denies eye injury.

## 2016-02-15 ENCOUNTER — Encounter (HOSPITAL_COMMUNITY): Payer: Medicare PPO | Attending: Adult Health | Admitting: Adult Health

## 2016-02-15 ENCOUNTER — Encounter (HOSPITAL_COMMUNITY): Payer: Medicare PPO

## 2016-02-15 ENCOUNTER — Ambulatory Visit (HOSPITAL_COMMUNITY): Payer: Medicare PPO | Admitting: Hematology & Oncology

## 2016-02-15 ENCOUNTER — Encounter (HOSPITAL_COMMUNITY): Payer: Self-pay | Admitting: Adult Health

## 2016-02-15 ENCOUNTER — Other Ambulatory Visit (HOSPITAL_COMMUNITY): Payer: Medicare PPO

## 2016-02-15 VITALS — BP 165/85 | HR 104 | Temp 98.3°F | Resp 18 | Wt 226.7 lb

## 2016-02-15 DIAGNOSIS — E119 Type 2 diabetes mellitus without complications: Secondary | ICD-10-CM | POA: Insufficient documentation

## 2016-02-15 DIAGNOSIS — Z9851 Tubal ligation status: Secondary | ICD-10-CM | POA: Insufficient documentation

## 2016-02-15 DIAGNOSIS — I2699 Other pulmonary embolism without acute cor pulmonale: Secondary | ICD-10-CM | POA: Diagnosis not present

## 2016-02-15 DIAGNOSIS — C50911 Malignant neoplasm of unspecified site of right female breast: Secondary | ICD-10-CM

## 2016-02-15 DIAGNOSIS — Z87891 Personal history of nicotine dependence: Secondary | ICD-10-CM | POA: Insufficient documentation

## 2016-02-15 DIAGNOSIS — G8929 Other chronic pain: Secondary | ICD-10-CM | POA: Insufficient documentation

## 2016-02-15 DIAGNOSIS — Z79899 Other long term (current) drug therapy: Secondary | ICD-10-CM | POA: Diagnosis not present

## 2016-02-15 DIAGNOSIS — I10 Essential (primary) hypertension: Secondary | ICD-10-CM | POA: Diagnosis not present

## 2016-02-15 DIAGNOSIS — Z808 Family history of malignant neoplasm of other organs or systems: Secondary | ICD-10-CM | POA: Insufficient documentation

## 2016-02-15 DIAGNOSIS — E669 Obesity, unspecified: Secondary | ICD-10-CM | POA: Diagnosis not present

## 2016-02-15 DIAGNOSIS — Z7984 Long term (current) use of oral hypoglycemic drugs: Secondary | ICD-10-CM | POA: Diagnosis not present

## 2016-02-15 DIAGNOSIS — Z923 Personal history of irradiation: Secondary | ICD-10-CM | POA: Insufficient documentation

## 2016-02-15 DIAGNOSIS — M2550 Pain in unspecified joint: Secondary | ICD-10-CM | POA: Diagnosis not present

## 2016-02-15 DIAGNOSIS — Z9889 Other specified postprocedural states: Secondary | ICD-10-CM | POA: Insufficient documentation

## 2016-02-15 DIAGNOSIS — Z803 Family history of malignant neoplasm of breast: Secondary | ICD-10-CM | POA: Diagnosis not present

## 2016-02-15 DIAGNOSIS — I2782 Chronic pulmonary embolism: Secondary | ICD-10-CM

## 2016-02-15 DIAGNOSIS — C50319 Malignant neoplasm of lower-inner quadrant of unspecified female breast: Secondary | ICD-10-CM

## 2016-02-15 DIAGNOSIS — Z170 Estrogen receptor positive status [ER+]: Secondary | ICD-10-CM | POA: Diagnosis present

## 2016-02-15 LAB — CBC WITH DIFFERENTIAL/PLATELET
Basophils Absolute: 0 10*3/uL (ref 0.0–0.1)
Basophils Relative: 0 %
EOS ABS: 0.3 10*3/uL (ref 0.0–0.7)
Eosinophils Relative: 3 %
HEMATOCRIT: 42.7 % (ref 36.0–46.0)
HEMOGLOBIN: 14.1 g/dL (ref 12.0–15.0)
LYMPHS ABS: 3.1 10*3/uL (ref 0.7–4.0)
Lymphocytes Relative: 30 %
MCH: 29 pg (ref 26.0–34.0)
MCHC: 33 g/dL (ref 30.0–36.0)
MCV: 87.7 fL (ref 78.0–100.0)
MONOS PCT: 6 %
Monocytes Absolute: 0.6 10*3/uL (ref 0.1–1.0)
NEUTROS ABS: 6 10*3/uL (ref 1.7–7.7)
NEUTROS PCT: 61 %
Platelets: 326 10*3/uL (ref 150–400)
RBC: 4.87 MIL/uL (ref 3.87–5.11)
RDW: 14.4 % (ref 11.5–15.5)
WBC: 10 10*3/uL (ref 4.0–10.5)

## 2016-02-15 LAB — COMPREHENSIVE METABOLIC PANEL
ALBUMIN: 3.8 g/dL (ref 3.5–5.0)
ALK PHOS: 90 U/L (ref 38–126)
ALT: 18 U/L (ref 14–54)
ANION GAP: 7 (ref 5–15)
AST: 21 U/L (ref 15–41)
BILIRUBIN TOTAL: 0.4 mg/dL (ref 0.3–1.2)
BUN: 7 mg/dL (ref 6–20)
CALCIUM: 9.8 mg/dL (ref 8.9–10.3)
CO2: 27 mmol/L (ref 22–32)
Chloride: 103 mmol/L (ref 101–111)
Creatinine, Ser: 0.77 mg/dL (ref 0.44–1.00)
GFR calc Af Amer: >60 mL/min (ref >60)
GFR calc non Af Amer: >60 mL/min (ref >60)
GLUCOSE: 89 mg/dL (ref 65–99)
Potassium: 3.7 mmol/L (ref 3.5–5.1)
SODIUM: 137 mmol/L (ref 135–145)
TOTAL PROTEIN: 8.1 g/dL (ref 6.5–8.1)

## 2016-02-15 MED ORDER — TRAMADOL HCL 50 MG PO TABS: 50.0000 mg | ORAL_TABLET | Freq: Four times a day (QID) | ORAL | 0 refills | Status: DC | PRN

## 2016-02-15 MED ORDER — RIVAROXABAN 20 MG PO TABS: 20.0000 mg | ORAL_TABLET | Freq: Every day | ORAL | 3 refills | Status: DC

## 2016-02-15 MED ORDER — LETROZOLE 2.5 MG PO TABS: 2.5000 mg | ORAL_TABLET | Freq: Every day | ORAL | 2 refills | Status: DC

## 2016-02-15 MED ORDER — LETROZOLE 2.5 MG PO TABS: 2.5000 mg | ORAL_TABLET | Freq: Every day | ORAL | 3 refills | Status: DC

## 2016-02-15 NOTE — Patient Instructions (Addendum)
[place] at [facility] Discharge Instructions  RECOMMENDATIONS MADE BY THE CONSULTANT AND ANY TEST RESULTS WILL BE SENT TO YOUR REFERRING PHYSICIAN.  You saw Mike Craze, NP, today Follow up in 6 weeks. See Amy at checkout for appointments.  Thank you for choosing [place] at [facility] to provide your oncology and hematology care.  To afford each patient quality time with our provider, please arrive at least 15 minutes before your scheduled appointment time.    If you have a lab appointment with the [place] please come in thru the  Main Entrance and check in at the main information desk  You need to re-schedule your appointment should you arrive 10 or more minutes late.  We strive to give you quality time with our providers, and arriving late affects you and other patients whose appointments are after yours.  Also, if you no show three or more times for appointments you may be dismissed from the clinic at the providers discretion.     Again, thank you for choosing [facility].  Our hope is that these requests will decrease the amount of time that you wait before being seen by our physicians.       _____________________________________________________________  Should you have questions after your visit to [facility], please contact our office at [phone] between the hours of 8:30 a.m. and 4:30 p.m.  Voicemails left after 4:30 p.m. will not be returned until the following business day.  For prescription refill requests, have your pharmacy contact our office.       Resources For Cancer Patients and their Caregivers ? American Cancer Society: Can assist with transportation, wigs, general needs, runs Look Good Feel Better.        (808) [phone] ? Cancer Care: Provides financial assistance, online support groups, medication/co-pay assistance.  1-800-813-HOPE 305-089-5066) ? [facility] Assists [place] cancer patients and their families through emotional , educational and financial support.  (680)295-7651 ? [place] DSS Where to apply for food stamps, Medicaid and utility assistance. (272)803-3938 ? RCATS: Transportation to medical appointments. 807-181-2649 ? Social Security Administration: May apply for disability if have a Stage IV cancer. 9794946200 (934)579-1071 ? [employer], Disability and Transit Services: Assists with nutrition, care and transit needs. [place] Support Programs: @10RELATIVEDAYS @ > Cancer Support Group  2nd Tuesday of the month 1pm-2pm, Journey Room  > Creative Journey  3rd Tuesday of the month 1130am-1pm, Journey Room  > Look Good Feel Better  1st Wednesday of the month 10am-12 noon, Journey Room (Call [place] to register [phone])

## 2016-02-15 NOTE — Progress Notes (Signed)
[place], [place] [address]   CLINIC:  Medical Oncology/Hematology  PCP:  Pcp Not In System None None   REASON FOR VISIT:  Follow-up for Stage IA right breast cancer, ER+   CURRENT THERAPY: Observation    BRIEF ONCOLOGIC HISTORY:    Right breast cancer T1bN0M0 ER Positive 2012   07/18/2010 Initial Diagnosis    Right breast cancer T1bN0M0 ER Positive 2012      07/11/2015 Pathology Results    BCI- 7.3% risk of late recurrence (5-10 years) HIGH risk, HIGH liklihood of benefit from extended endocrine therapy, with 16.5% absolute benefit, 13.5% risk of overall recurrence (0-10 years) intermediate risk, with high likelihood of benefit.        HISTORY OF PRESENT ILLNESS:  Alexa Castillo 66 y.o. female returns for follow-up of her Stage IA, ER+ right breast cancer, diagnosed in 07/2010. She underwent lumpectomy, adjuvant radiation therapy, and was anti-estrogen therapy with Anastrozole from 11/2010-2017; she stopped Anastrozole in 2017 due to arthralgias.  The Breast Cancer Index (BCI) genomic testing was sent in 06/2015 revealing high-risk of recurrence, as well as high-likelihood of benefit of extension of anti-estrogen therapy beyond 5 years.  On Xarelto for history of PE.    INTERVAL HISTORY:  Alexa Castillo presents for follow up. Overall, she has been doing well.   Her Xarelto increased in price from $47-60 to $116 since she hasn't hit her deductible yet for this calendar year. Then, the Xarelto price increased for her to over $400/month.  She struggled to pay for the $116 price last month; her son helped her out with covering the cost.  Her back still hurts, but it is feeling better today than it did yesterday. She takes Tramadol for the pain. Some days the pain is so bad she can't stand up straight. Occasionally, Tylenol Arthritis OTC helps her as well.    She was on Anastrozole for 5 years, which caused a lot of joint problems. She has a  high risk of cancer recurrence, but couldn't handle Anastrozole any longer. She would like to talk about the possibility of trying other anti-estrogen drugs.   She has been having normal bowel movements. Denies headaches, dizziness, bone pain, vaginal bleeding, or any other concerns.   REVIEW OF SYSTEMS:  Review of Systems  Constitutional: Negative.  Negative for chills and fever.  HENT:  Negative.   Eyes: Negative.   Respiratory: Negative.  Negative for cough and shortness of breath.   Cardiovascular: Negative.  Negative for chest pain and palpitations.  Gastrointestinal: Negative.  Negative for abdominal pain, blood in stool, diarrhea, nausea and vomiting.  Endocrine: Negative.   Genitourinary: Negative.  Negative for vaginal bleeding.   Musculoskeletal: Positive for arthralgias (chronic arthritis ) and back pain (chronic).       Denies new bone pain.   Skin: Negative.   Neurological: Negative.  Negative for dizziness and headaches.  Hematological: Negative.   Psychiatric/Behavioral: Negative.   All other systems reviewed and are negative.   PAST MEDICAL/SURGICAL HISTORY:  Past Medical History:  Diagnosis Date  . Arthritis    back/saw Dr. Aline Brochure  . Breast cancer ([facility])   . Cancer ([facility])    breast  . Diabetes mellitus   . Hypertension   . Low back pain   . Obesity   . Sleep apnea 04/2013   Past Surgical History:  Procedure Laterality Date  . BIOPSY BREAST    . TUBAL LIGATION  SOCIAL HISTORY:  Social History   Social History  . Marital status: Single    Spouse name: N/A  . Number of children: N/A  . Years of education: N/A   Occupational History  . Not on file.   Social History Main Topics  . Smoking status: Former Smoker    Packs/day: 1.50    Years: 17.00    Types: Cigarettes    Quit date: 12/18/1994  . Smokeless tobacco: Never Used  . Alcohol use No  . Drug use: No  . Sexual activity: Not on file   Other Topics Concern  . Not on file   Social  History Narrative  . No narrative on file    FAMILY HISTORY:  Family History  Problem Relation Age of Onset  . Liver cancer Mother   . Cancer Mother   . Diabetes Mother   . Breast cancer Sister   . Cancer Sister     CURRENT MEDICATIONS:  Outpatient Encounter Prescriptions as of 02/15/2016  Medication Sig Note  . calcium-vitamin D (OSCAL WITH D) 500-200 MG-UNIT per tablet Take 1 tablet by mouth 2 (two) times daily.   . cephALEXin (KEFLEX) 500 MG capsule Take 1 capsule (500 mg total) by mouth 4 (four) times daily.   Marland Kitchen glipiZIDE (GLUCOTROL) 10 MG tablet Take 10 mg by mouth 2 (two) times daily before a meal.    . metFORMIN (GLUCOPHAGE) 1000 MG tablet Take 1,000 mg by mouth 2 (two) times daily. 07/27/2014: Received from: External Pharmacy  . metoprolol succinate (TOPROL-XL) 25 MG 24 hr tablet Take 25 mg by mouth daily. 07/27/2014: Received from: External Pharmacy  . Omega-3 Fatty Acids (FISH OIL) 1000 MG CPDR Take 1,000 mg by mouth daily.    Marland Kitchen omeprazole (PRILOSEC) 20 MG capsule Take 20 mg by mouth every other day. 07/20/2013: Received from: External Pharmacy Received Sig:   . rivaroxaban (XARELTO) 20 MG TABS tablet Take 1 tablet (20 mg total) by mouth daily.   . traMADol (ULTRAM) 50 MG tablet Take 1 tablet (50 mg total) by mouth every 6 (six) hours as needed.   . [DISCONTINUED] rivaroxaban (XARELTO) 20 MG TABS tablet Take 1 tablet (20 mg total) by mouth daily.   . [DISCONTINUED] traMADol (ULTRAM) 50 MG tablet Take 1 tablet (50 mg total) by mouth every 6 (six) hours as needed.   Marland Kitchen letrozole (FEMARA) 2.5 MG tablet Take 1 tablet (2.5 mg total) by mouth daily.   . [DISCONTINUED] letrozole (FEMARA) 2.5 MG tablet Take 1 tablet (2.5 mg total) by mouth daily.    No facility-administered encounter medications on file as of 02/15/2016.     ALLERGIES:  Allergies  Allergen Reactions  . Lisinopril      PHYSICAL EXAM:  ECOG Performance status: 0   Vitals:   02/15/16 1352  BP: (!) 165/85  Pulse:  (!) 104  Resp: 18  Temp: 98.3 F (36.8 C)   Filed Weights   02/15/16 1352  Weight: 226 lb 11.2 oz (102.8 kg)   Physical Exam  Constitutional: She is oriented to person, place, and time and well-developed, well-nourished, and in no distress.  HENT:  Head: Normocephalic and atraumatic.  Eyes: Conjunctivae are normal. Pupils are equal, round, and reactive to light. No scleral icterus.  Neck: Normal range of motion. Neck supple.  Cardiovascular: Normal rate, regular rhythm and normal heart sounds.   Pulmonary/Chest: Effort normal and breath sounds normal. No respiratory distress.    Abdominal: Soft. Bowel sounds are normal. There is  no tenderness.  Musculoskeletal: Normal range of motion. She exhibits no edema.  Lymphadenopathy:    She has no cervical adenopathy.    She has no axillary adenopathy.       Right: No supraclavicular adenopathy present.       Left: No supraclavicular adenopathy present.  Neurological: She is alert and oriented to person, place, and time. Gait normal.  Skin: Skin is warm and dry.  Nursing note and vitals reviewed.  LABORATORY DATA:  I have reviewed the labs as listed.  CBC    Component Value Date/Time   WBC 10.0 02/15/2016 1335   RBC 4.87 02/15/2016 1335   HGB 14.1 02/15/2016 1335   HCT 42.7 02/15/2016 1335   PLT 326 02/15/2016 1335   MCV 87.7 02/15/2016 1335   MCH 29.0 02/15/2016 1335   MCHC 33.0 02/15/2016 1335   RDW 14.4 02/15/2016 1335   LYMPHSABS 3.1 02/15/2016 1335   MONOABS 0.6 02/15/2016 1335   EOSABS 0.3 02/15/2016 1335   BASOSABS 0.0 02/15/2016 1335   CMP Latest Ref Rng & Units 02/15/2016 08/15/2015 03/02/2015  Glucose 65 - 99 mg/dL 89 144(H) 482(H)  BUN 6 - 20 mg/dL 7 11 15   Creatinine 0.44 - 1.00 mg/dL 0.77 0.90 0.92  Sodium 135 - 145 mmol/L 137 142 134(L)  Potassium 3.5 - 5.1 mmol/L 3.7 4.0 4.3  Chloride 101 - 111 mmol/L 103 104 99(L)  CO2 22 - 32 mmol/L 27 - 23  Calcium 8.9 - 10.3 mg/dL 9.8 - 9.3  Total Protein 6.5 - 8.1 g/dL  8.1 - -  Total Bilirubin 0.3 - 1.2 mg/dL 0.4 - -  Alkaline Phos 38 - 126 U/L 90 - -  AST 15 - 41 U/L 21 - -  ALT 14 - 54 U/L 18 - -    PENDING LABS:    DIAGNOSTIC IMAGING:  Last mammogram: 06/05/15 CLINICAL DATA: Status post right lumpectomy and radiation therapy for breast cancer in 2012.  EXAM: 2D DIGITAL DIAGNOSTIC BILATERAL MAMMOGRAM WITH CAD AND ADJUNCT TOMO  COMPARISON: Previous exam(s).  ACR Breast Density Category a: The breast tissue is almost entirely fatty.  FINDINGS: Stable post lumpectomy changes on the right. On the left, there is a small group of tiny calcifications in the central, inferior portion of the breast. These demonstrate some dependent layering on spot magnification views and in comparison to previous examinations, compatible with benign milk of calcium. There are no findings suspicious for malignancy in either breast.  Mammographic images were processed with CAD.  IMPRESSION: No evidence of malignancy.  RECOMMENDATION: Bilateral diagnostic mammogram in 1 year.  CT chest: 08/15/2015   PATHOLOGY:       ASSESSMENT & PLAN:   Stage IA right breast cancer, ER+:  -Alexa Castillo is now 5+ years out from her original breast cancer diagnosis without evidence of recurrence, which is very favorable. Breast exam benign today.  -She will be due for annual diagnostic mammogram in 05/2016.  -We reviewed her BCI results, which were completed in 06/2015; results revealed high-risk of recurrence, as well as high-likelihood of benefit from extension of anti-estrogen therapy beyond 5 years.  Previously, she was reluctant to start any new anti-estrogen medications because her arthralgias were so severe on Anastrozole. We discussed that the other aromatase inhibitors (like Aromasin or Femara) carry similar side effect profiles, with arthralgias, vaginal dryness, hot flashes, and decreased bone density as side effects.  Tamoxifen is not an appropriate choice for her  d/t her history of PE.  Some patients are able to tolerate different AI medications with less severe side effects and she would like to "give it one more try."  Therefore, I will start her on Letrozole 2.5 mg daily. I will plan on seeing her back in about 6 weeks to follow-up on her tolerance of this new medication.  If her arthralgias worsen, then we will stop AI therapy and continue with surveillance alone going forward. Alexa Castillo agrees with this plan.      Arthralgias/Chronic joint pain:  -Tramadol refilled today. Paper prescription given to patient.    Bone health:  -She understands that Femara can worsen bone density. Her last DEXA scan was on 01/25/14 and was normal.  Encouraged her to continue with calcium & vitamin D supplementation, as well as increasing weight-bearing exercises as tolerated to maintain bone density.  We will consider repeating bone density sometime in 2018, if she tolerates the Femara well.    Pulmonary embolism on anti-coagulation:  -Hospital admission on 01/15/2015 through 01/18/2015 for PE, cardiac strain noted on CT. She completed 6 months of XARELTO in July. Thrombophilia evaluation was negative. She presented to our office hypoxemic and tachpneic, repeat CTA on 08/15/2015 still showed residual pulmonary emboli.  -She is having difficulty paying for the Xarelto. She was given a paper prescription for a 76-month free "trial" of Xarelto from the drug company.  We will see if Angie, our patient advocate, can help with getting the patient access to more affordable medication.  -We discussed the opportunity for her to switch to another agent, like Coumadin. This would require a bridge with Lovenox, which may also be an expensive option for the patient.  Coumadin also requires frequent blood draws for monitoring, as well as diet modifications.   -For now, we will keep her on Xarelto 20 mg daily. Encouraged her to contact her insurance company to see if there may be cheaper  alternatives for her as well.       Dispo:  -She will return in 6 weeks to see how she is tolerating the Letrozole; order mammogram at this follow-up visit to be done in 05/2016. Also consider ordering repeat DEXA scan if tolerating Letrozole well.    All questions were answered to patient's stated satisfaction. Encouraged patient to call with any new concerns or questions before her next visit to the cancer center and we can certain see her sooner, if needed.     This document serves as a record of services personally performed by Mike Craze, NP. It was created on her behalf by Martinique Casey, a trained medical scribe. The creation of this record is based on the scribe's personal observations and the provider's statements to them. This document has been checked and approved by the attending provider.   I have reviewed the above documentation for accuracy and completeness and I agree with the above.  Mike Craze, NP [place] [phone]

## 2016-02-21 ENCOUNTER — Telehealth (HOSPITAL_COMMUNITY): Payer: Self-pay | Admitting: Adult Health

## 2016-02-21 NOTE — Telephone Encounter (Signed)
Called pt and advised her that I will submit a app to [employer] for assist with xarelto. I will also submit an application to [email or web address] to see if she is elig.

## 2016-03-03 ENCOUNTER — Telehealth (HOSPITAL_COMMUNITY): Payer: Self-pay | Admitting: Oncology

## 2016-03-03 NOTE — Telephone Encounter (Signed)
FAXED APP TO  [employer] FOR COPAY ASSIST

## 2016-03-14 ENCOUNTER — Telehealth (HOSPITAL_COMMUNITY): Payer: Self-pay

## 2016-03-14 DIAGNOSIS — C50319 Malignant neoplasm of lower-inner quadrant of unspecified female breast: Secondary | ICD-10-CM

## 2016-03-14 DIAGNOSIS — I2782 Chronic pulmonary embolism: Secondary | ICD-10-CM

## 2016-03-14 MED ORDER — RIVAROXABAN 20 MG PO TABS: 20.0000 mg | ORAL_TABLET | Freq: Every day | ORAL | 3 refills | Status: DC

## 2016-03-14 NOTE — Telephone Encounter (Signed)
Received call from financial counselor that she was trying to help patient obtain her Xarelto. She needed the prescription sent to [facility] because the cancer center has an account there. Reviewed with NP. Prescription sent as requested.

## 2016-03-20 ENCOUNTER — Other Ambulatory Visit (HOSPITAL_COMMUNITY): Payer: Self-pay | Admitting: Hematology & Oncology

## 2016-03-20 DIAGNOSIS — G8929 Other chronic pain: Secondary | ICD-10-CM

## 2016-03-20 DIAGNOSIS — M2550 Pain in unspecified joint: Secondary | ICD-10-CM

## 2016-03-20 DIAGNOSIS — C50319 Malignant neoplasm of lower-inner quadrant of unspecified female breast: Secondary | ICD-10-CM

## 2016-04-01 ENCOUNTER — Encounter (HOSPITAL_COMMUNITY): Payer: Medicare PPO | Attending: Adult Health | Admitting: Hematology

## 2016-04-01 ENCOUNTER — Encounter (HOSPITAL_COMMUNITY): Payer: Self-pay | Admitting: Hematology

## 2016-04-01 VITALS — BP 145/80 | HR 101 | Temp 99.9°F | Resp 16 | Ht 62.0 in | Wt 227.6 lb

## 2016-04-01 DIAGNOSIS — Z853 Personal history of malignant neoplasm of breast: Secondary | ICD-10-CM | POA: Diagnosis not present

## 2016-04-01 DIAGNOSIS — I2782 Chronic pulmonary embolism: Secondary | ICD-10-CM | POA: Diagnosis not present

## 2016-04-01 DIAGNOSIS — E669 Obesity, unspecified: Secondary | ICD-10-CM | POA: Insufficient documentation

## 2016-04-01 DIAGNOSIS — G8929 Other chronic pain: Secondary | ICD-10-CM | POA: Diagnosis not present

## 2016-04-01 DIAGNOSIS — M545 Low back pain, unspecified: Secondary | ICD-10-CM

## 2016-04-01 DIAGNOSIS — C50911 Malignant neoplasm of unspecified site of right female breast: Secondary | ICD-10-CM | POA: Insufficient documentation

## 2016-04-01 DIAGNOSIS — I2699 Other pulmonary embolism without acute cor pulmonale: Secondary | ICD-10-CM | POA: Insufficient documentation

## 2016-04-01 DIAGNOSIS — Z170 Estrogen receptor positive status [ER+]: Secondary | ICD-10-CM | POA: Insufficient documentation

## 2016-04-01 DIAGNOSIS — Z780 Asymptomatic menopausal state: Secondary | ICD-10-CM | POA: Diagnosis not present

## 2016-04-01 DIAGNOSIS — Z803 Family history of malignant neoplasm of breast: Secondary | ICD-10-CM | POA: Insufficient documentation

## 2016-04-01 DIAGNOSIS — Z9889 Other specified postprocedural states: Secondary | ICD-10-CM | POA: Insufficient documentation

## 2016-04-01 DIAGNOSIS — Z9851 Tubal ligation status: Secondary | ICD-10-CM | POA: Insufficient documentation

## 2016-04-01 DIAGNOSIS — Z79899 Other long term (current) drug therapy: Secondary | ICD-10-CM | POA: Insufficient documentation

## 2016-04-01 DIAGNOSIS — I10 Essential (primary) hypertension: Secondary | ICD-10-CM | POA: Insufficient documentation

## 2016-04-01 DIAGNOSIS — Z87891 Personal history of nicotine dependence: Secondary | ICD-10-CM | POA: Insufficient documentation

## 2016-04-01 DIAGNOSIS — Z7984 Long term (current) use of oral hypoglycemic drugs: Secondary | ICD-10-CM | POA: Insufficient documentation

## 2016-04-01 DIAGNOSIS — C50319 Malignant neoplasm of lower-inner quadrant of unspecified female breast: Secondary | ICD-10-CM

## 2016-04-01 DIAGNOSIS — E119 Type 2 diabetes mellitus without complications: Secondary | ICD-10-CM | POA: Insufficient documentation

## 2016-04-01 DIAGNOSIS — Z808 Family history of malignant neoplasm of other organs or systems: Secondary | ICD-10-CM | POA: Insufficient documentation

## 2016-04-01 DIAGNOSIS — Z923 Personal history of irradiation: Secondary | ICD-10-CM | POA: Insufficient documentation

## 2016-04-01 NOTE — Patient Instructions (Signed)
[place] at [facility] Discharge Instructions  RECOMMENDATIONS MADE BY THE CONSULTANT AND ANY TEST RESULTS WILL BE SENT TO YOUR REFERRING PHYSICIAN.  You were seen today by Dr. Irene Limbo We will get you an appointment of your back.  We will call you with those results Follow up in 2 months after mammogram and bone density scans  See Amy up front for appointments   Thank you for choosing [place] at [facility] to provide your oncology and hematology care.  To afford each patient quality time with our provider, please arrive at least 15 minutes before your scheduled appointment time.    If you have a lab appointment with the [facility] please come in thru the  Main Entrance and check in at the main information desk  You need to re-schedule your appointment should you arrive 10 or more minutes late.  We strive to give you quality time with our providers, and arriving late affects you and other patients whose appointments are after yours.  Also, if you no show three or more times for appointments you may be dismissed from the clinic at the providers discretion.     Again, thank you for choosing [facility].  Our hope is that these requests will decrease the amount of time that you wait before being seen by our physicians.       _____________________________________________________________  Should you have questions after your visit to [facility], please contact our office at [phone] between the hours of 8:30 a.m. and 4:30 p.m.  Voicemails left after 4:30 p.m. will not be returned until the following business day.  For prescription refill requests, have your pharmacy contact our office.       Resources For Cancer Patients and their Caregivers ? American Cancer Society: Can assist with transportation, wigs, general needs, runs Look Good Feel Better.        347-705-0871 ? Cancer Care: Provides financial  assistance, online support groups, medication/co-pay assistance.  1-800-813-HOPE (970)348-2803) ? [facility] Assists [place] cancer patients and their families through emotional , educational and financial support.  682 747 2246 ? [place] DSS Where to apply for food stamps, Medicaid and utility assistance. (989) 162-8404 ? [employer]: Transportation to medical appointments. 414-088-2150 ? Social Security Administration: May apply for disability if have a Stage IV cancer. 250-156-5740 (581) 649-3097 ? [employer], Disability and Transit Services: Assists with nutrition, care and transit needs. [place] Support Programs: @10RELATIVEDAYS @ > Cancer Support Group  2nd Tuesday of the month 1pm-2pm, Journey Room  > Creative Journey  3rd Tuesday of the month 1130am-1pm, Journey Room  > Look Good Feel Better  1st Wednesday of the month 10am-12 noon, Journey Room (Call [place] to register [phone])

## 2016-04-06 NOTE — Progress Notes (Signed)
Alexa Castillo  Date of service: .04/01/2016  Patient Care Team: Pcp Not In System as PCP - General  CC: f/u for breast cancer  Diagnosis: Stage IA right breast cancer, ER+   Current Treatment: letrozole   SUMMARY OF ONCOLOGIC HISTORY:   Right breast cancer T1bN0M0 ER Positive 2012   07/18/2010 Initial Diagnosis    Right breast cancer T1bN0M0 ER Positive 2012      07/11/2015 Pathology Results    BCI- 7.3% risk of late recurrence (5-10 years) HIGH risk, HIGH liklihood of benefit from extended endocrine therapy, with 16.5% absolute benefit, 13.5% risk of overall recurrence (0-10 years) intermediate risk, with high likelihood of benefit.     HISTORY OF PRESENT ILLNESS:  Alexa Castillo [id number] y.o.femalereturns for follow-up of her Stage IA, ER+ right breast cancer, diagnosed in 07/2010. She underwent lumpectomy, adjuvant radiation therapy, and was anti-estrogen therapy with Anastrozole from 11/2010-2017; she stopped Anastrozole in 2017 due to arthralgias.  The Breast Cancer Index (BCI) genomic testing was sent in 06/2015 revealing high-risk of recurrence, as well as high-likelihood of benefit of extension of anti-estrogen therapy beyond 5 years.  On Xarelto for history of PE.   INTERVAL HISTORY:  Patient was previously on Arimidex and finished 5 years of treatment but this was stopped due to significant body aches and bone pains. She was agreeable to switch to letrozole and is here for follow-up. She notes that her generalized body aches have been reasonable however her chronic low back pain has been more persistent and is only partially helped by her tramadol. We discussed that we should get a L-spine x-Alexa Castillo to rule out any compression fractures.   REVIEW OF SYSTEMS:    10 Point review of systems of done and is negative except as noted above.  . Past Medical History:  Diagnosis Date  . Arthritis    back/saw Dr. Aline Castillo  . Breast cancer (Alexa Castillo)   . Cancer (Alexa Castillo)     breast  . Diabetes mellitus   . Hypertension   . Low back pain   . Obesity   . Sleep apnea 04/2013    . Past Surgical History:  Procedure Laterality Date  . BIOPSY BREAST    . TUBAL LIGATION      . Social History  Substance Use Topics  . Smoking status: Former Smoker    Packs/day: 1.50    Years: 17.00    Types: Cigarettes    Quit date: 12/18/1994  . Smokeless tobacco: Never Used  . Alcohol use No    ALLERGIES:  is allergic to lisinopril.  MEDICATIONS:  Current Outpatient Prescriptions  Medication Sig Dispense Refill  . calcium-vitamin D (OSCAL WITH D) 500-200 MG-UNIT per tablet Take 1 tablet by mouth 2 (two) times daily.    Alexa Kitchen glipiZIDE (GLUCOTROL) 10 MG tablet Take 10 mg by mouth 2 (two) times daily before a meal.     . letrozole (FEMARA) 2.5 MG tablet Take 1 tablet (2.5 mg total) by mouth daily. 90 tablet 3  . metFORMIN (GLUCOPHAGE) 1000 MG tablet Take 1,000 mg by mouth 2 (two) times daily.  1  . metoprolol succinate (TOPROL-XL) 25 MG 24 hr tablet Take 25 mg by mouth daily.  1  . Omega-3 Fatty Acids (FISH OIL) 1000 MG CPDR Take 1,000 mg by mouth daily.     Alexa Kitchen omeprazole (PRILOSEC) 20 MG capsule Take 20 mg by mouth every other day.    . rivaroxaban (XARELTO) 20 MG TABS tablet Take  1 tablet (20 mg total) by mouth daily. 90 tablet 3  . traMADol (ULTRAM) 50 MG tablet TAKE 1 TABLET BY MOUTH EVERY 6 HOURS AS NEEDED 90 tablet 2   No current facility-administered medications for this visit.     PHYSICAL EXAMINATION: ECOG PERFORMANCE STATUS: 2 - Symptomatic, <50% confined to bed  . Vitals:   04/01/16 1544  BP: (!) 145/80  Pulse: (!) 101  Resp: 16  Temp: 99.9 F (37.7 C)    Filed Weights   04/01/16 1544  Weight: 227 lb 9.6 oz (103.2 kg)   .Body mass index is 41.63 kg/m.  GENERAL:alert, in no acute distress and comfortable SKIN: no acute rashes, no significant lesions EYES: conjunctiva are pink and non-injected, sclera anicteric OROPHARYNX: MMM, no exudates,  no oropharyngeal erythema or ulceration NECK: supple, no JVD LYMPH:  no palpable lymphadenopathy in the cervical, axillary or inguinal regions LUNGS: clear to auscultation b/l with normal respiratory effort HEART: regular rate & rhythm ABDOMEN:  normoactive bowel sounds , non tender, not distended. Extremity: no pedal edema PSYCH: alert & oriented x 3 with fluent speech NEURO: no focal motor/sensory deficits  LABORATORY DATA:   I have reviewed the data as listed  . CBC Latest Ref Rng & Units 02/15/2016 08/15/2015 03/02/2015  WBC 4.0 - 10.5 K/uL 10.0 - 14.3(H)  Hemoglobin 12.0 - 15.0 g/dL 14.1 15.3(H) 14.6  Hematocrit 36.0 - 46.0 % 42.7 45.0 43.8  Platelets 150 - 400 K/uL 326 - 215    . CMP Latest Ref Rng & Units 02/15/2016 08/15/2015 03/02/2015  Glucose 65 - 99 mg/dL 89 144(H) 482(H)  BUN 6 - 20 mg/dL 7 11 15   Creatinine 0.44 - 1.00 mg/dL 0.77 0.90 0.92  Sodium 135 - 145 mmol/L 137 142 134(L)  Potassium 3.5 - 5.1 mmol/L 3.7 4.0 4.3  Chloride 101 - 111 mmol/L 103 104 99(L)  CO2 22 - 32 mmol/L 27 - 23  Calcium 8.9 - 10.3 mg/dL 9.8 - 9.3  Total Protein 6.5 - 8.1 g/dL 8.1 - -  Total Bilirubin 0.3 - 1.2 mg/dL 0.4 - -  Alkaline Phos 38 - 126 U/L 90 - -  AST 15 - 41 U/L 21 - -  ALT 14 - 54 U/L 18 - -     RADIOGRAPHIC STUDIES: I have personally reviewed the radiological images as listed and agreed with the findings in the report. No results found.  ASSESSMENT & PLAN:   Stage IA right breast cancer, ER+:  -[name] is now 5+ years out from her original breast cancer diagnosis without evidence of recurrence. Plan -Due to high risk BCI results suggesting benefit of extended endocrine therapies beyond 5 years she was started on letrozole 6 weeks ago. -She notes some arthralgias but not as severe as previously. -She is willing to continue taking the letrozole 2.5 mg by mouth daily. -She will need a bone density scan sometime in 05/2016 prior to clinic visit -due for annual diagnostic  mammogram in 05/2016.  -Due to her persistent low back pain and extra of the lumbar spine was ordered to rule out compression fractures. -Continue calcium and vitamin D. -Might need more aggressive Alexa Castillo management a bone density scan shows osteoporosis or osteopenia.   Bone health:  -Her last DEXA scan was on 01/25/14 and was normal.  - continue with calcium & vitamin D supplementation, as well as increasing weight-bearing exercises as tolerated to maintain bone density.  -A repeat bone density scan in May 2018 prior  to follow-up clinic visit.    Pulmonary embolism on anti-coagulation:  -Hospital admission on 01/15/2015 through 01/18/2015 for PE, cardiac strain noted on CT. She completed 6 months of XARELTO in July. Thrombophilia evaluation was negative. She presented to our office hypoxemic and tachpneic, repeat CTA on 08/15/2015 still showed residual pulmonary emboli.  Plan -Continue Xarelto   Return to clinic in 2 months with repeat labs, mammogram and bone density scan  I spent 25 minutes counseling the patient face to face. The total time spent in the appointment was 30 minutes and more than 50% was on counseling and direct patient cares.    Sullivan Lone MD [place] AAHIVMS [facility] Hematology/Oncology Physician [facility]  (Office):       [phone] (Work cell):  [phone] (Fax):           [phone]

## 2016-04-30 ENCOUNTER — Telehealth (HOSPITAL_COMMUNITY): Payer: Self-pay | Admitting: Oncology

## 2016-04-30 NOTE — Telephone Encounter (Signed)
RETURNED PTS CALL. NO ANSWER HAD TO LEAVE A VM

## 2016-05-07 ENCOUNTER — Telehealth (HOSPITAL_COMMUNITY): Payer: Self-pay | Admitting: Oncology

## 2016-05-07 NOTE — Telephone Encounter (Signed)
[place] A FAX FROM [employer] STATING THE PT WAS APPROVED FOR TEMP COV FOR XARELTO. SHE MUST SIGN AND DATE A MEDICARE PART D CERTIFICATION FORM PRIOR TO MAY 25 AND RTN TO THEM. SHE ALSO HAS TO APPLY FOR MEDICARE PART D LOW INCOME SUBSIDY IN ORDER TO MAINTAIN HER ASSISTANCE. PC TO PT BUT HAD TO LEAVE A VM

## 2016-06-03 ENCOUNTER — Ambulatory Visit (HOSPITAL_COMMUNITY)
Admission: RE | Admit: 2016-06-03 | Discharge: 2016-06-03 | Disposition: A | Discharge status: Home / Self Care | Payer: Medicare HMO | Source: Ambulatory Visit | Attending: Hematology | Admitting: Hematology

## 2016-06-03 DIAGNOSIS — Z853 Personal history of malignant neoplasm of breast: Secondary | ICD-10-CM | POA: Diagnosis not present

## 2016-06-03 DIAGNOSIS — M545 Low back pain, unspecified: Secondary | ICD-10-CM

## 2016-06-03 DIAGNOSIS — G8929 Other chronic pain: Secondary | ICD-10-CM | POA: Diagnosis not present

## 2016-06-03 DIAGNOSIS — Z1231 Encounter for screening mammogram for malignant neoplasm of breast: Secondary | ICD-10-CM | POA: Diagnosis present

## 2016-06-03 DIAGNOSIS — Z780 Asymptomatic menopausal state: Secondary | ICD-10-CM | POA: Insufficient documentation

## 2016-06-05 ENCOUNTER — Other Ambulatory Visit (HOSPITAL_COMMUNITY): Payer: Medicare PPO

## 2016-06-05 ENCOUNTER — Ambulatory Visit (HOSPITAL_COMMUNITY): Payer: Medicare PPO | Admitting: Oncology

## 2016-06-05 NOTE — Progress Notes (Signed)
Alexa Castillo, [place] [address]   CLINIC:  Medical Oncology/Hematology  PCP:  Abran Richard, MD [address] [place] / [place] [address] [phone]   REASON FOR VISIT:  Follow-up for Stage IA right breast cancer, ER+   CURRENT THERAPY: Letrozole 2.5 mg daily     BRIEF ONCOLOGIC HISTORY:    Right breast cancer T1bN0M0 ER Positive 2012   07/18/2010 Initial Diagnosis    Right breast cancer T1bN0M0 ER Positive 2012      07/11/2015 Pathology Results    BCI- 7.3% risk of late recurrence (5-10 years) HIGH risk, HIGH liklihood of benefit from extended endocrine therapy, with 16.5% absolute benefit, 13.5% risk of overall recurrence (0-10 years) intermediate risk, with high likelihood of benefit.        HISTORY OF PRESENT ILLNESS:  Alexa Castillo 66 y.o. female returns for follow-up of her Stage IA, ER+ right breast cancer, diagnosed in 07/2010. She underwent lumpectomy, adjuvant radiation therapy, and was anti-estrogen therapy with Anastrozole from 11/2010-2017; she stopped Anastrozole in 2017 due to arthralgias.  The Breast Cancer Index (BCI) genomic testing was sent in 06/2015 revealing high-risk of recurrence, as well as high-likelihood of benefit of extension of anti-estrogen therapy beyond 5 years.  On Xarelto for history of PE.      INTERVAL HISTORY:  Alexa Castillo 66 y.o. female is here for follow-up for history of breast cancer.   At her last visit, she agreed to try the Letrozole as she had a hard time tolerating Arimidex d/t worsening arthralgias.   Overall, she tells me she has been feeling quite well. Endorses some fatigue, with energy levels of 50%. Her appetite is excellent at 100%. She remains on the letrozole with good tolerance. She has occasional hot flashes, which occur mostly in the mornings. Call flashes are manageable and do not cause her a lot of distress. Does endorse continued arthralgias, but they are not as severe as  previous. However, she does continue to have persistent low back pain. She takes tramadol for her back pain, generally 2 tabs in the morning and 2 tabs in the evening; states that the tramadol is partially effective but she continues to have low back pain. Her pain is worse with standing; she has done physical therapy in the past, which was not helpful in improving her symptoms. Her back pain associated at times, it affects her ability to stand up straight whenever she walks.  Also remains on Xarelto with good tolerance. Denies any bleeding episodes including blood in her stools/dark tarry stools, hematuria, vaginal bleeding, nosebleeds, or gingival bleeding. She has been able to obtain the drug at reduced cost recently and has access to a resource to help her in the future covering the cost of this medication (this was previously a big issue for her).   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 Merlene Laughter in the past who feels that this neuropathy is secondary to her diabetes vs carpal tunnel.  Denies any changes to her breasts bilaterally. She recently underwent mammogram, on 06/03/16 which was negative. She also recently had bone density imaging and would like to review those results today.    REVIEW OF SYSTEMS:  Review of Systems  Constitutional: Negative.  Negative for chills and fever.  HENT:  Negative.   Eyes: Negative.   Respiratory: Negative.  Negative for cough and shortness of breath.   Cardiovascular: Negative.  Negative for  chest pain and palpitations.  Gastrointestinal: Negative.  Negative for abdominal pain, blood in stool, constipation, diarrhea, nausea and vomiting.  Endocrine: Positive for hot flashes.  Genitourinary: Negative.  Negative for dysuria, hematuria and vaginal bleeding.   Musculoskeletal: Positive for arthralgias (chronic arthritis ) and back pain (chronic).       Denies new bone pain.   Skin: Negative.   Neurological:  Negative.  Negative for dizziness and headaches.  Hematological: Negative.  Does not bruise/bleed easily.  Psychiatric/Behavioral: Negative.   All other systems reviewed and are negative.   PAST MEDICAL/SURGICAL HISTORY:  Past Medical History:  Diagnosis Date  . Arthritis    back/saw Dr. Aline Brochure  . Breast cancer ([place])   . Cancer (HCC)    breast  . Diabetes mellitus   . Hypertension   . Low back pain   . Obesity   . Sleep apnea 04/2013   Past Surgical History:  Procedure Laterality Date  . BIOPSY BREAST    . TUBAL LIGATION       SOCIAL HISTORY:  Social History   Social History  . Marital status: Single    Spouse name: N/A  . Number of children: N/A  . Years of education: N/A   Occupational History  . Not on file.   Social History Main Topics  . Smoking status: Former Smoker    Packs/day: 1.50    Years: 17.00    Types: Cigarettes    Quit date: 12/18/1994  . Smokeless tobacco: Never Used  . Alcohol use No  . Drug use: No  . Sexual activity: Not on file   Other Topics Concern  . Not on file   Social History Narrative  . No narrative on file    FAMILY HISTORY:  Family History  Problem Relation Age of Onset  . Liver cancer Mother   . Cancer Mother   . Diabetes Mother   . Breast cancer Sister   . Cancer Sister     CURRENT MEDICATIONS:  Outpatient Encounter Prescriptions as of 06/06/2016  Medication Sig Note  . calcium-vitamin D (OSCAL WITH D) 500-200 MG-UNIT per tablet Take 1 tablet by mouth 2 (two) times daily.   Marland Kitchen glipiZIDE (GLUCOTROL) 10 MG tablet Take 10 mg by mouth 2 (two) times daily before a meal.    . letrozole (FEMARA) 2.5 MG tablet Take 1 tablet (2.5 mg total) by mouth daily.   . metFORMIN (GLUCOPHAGE) 1000 MG tablet Take 1,000 mg by mouth 2 (two) times daily. 07/27/2014: Received from: External Pharmacy  . metoprolol succinate (TOPROL-XL) 25 MG 24 hr tablet Take 25 mg by mouth daily. 07/27/2014: Received from: External Pharmacy  . Omega-3  Fatty Acids (FISH OIL) 1000 MG CPDR Take 1,000 mg by mouth daily.    Marland Kitchen omeprazole (PRILOSEC) 20 MG capsule Take 20 mg by mouth every other day. 07/20/2013: Received from: External Pharmacy Received Sig:   . rivaroxaban (XARELTO) 20 MG TABS tablet Take 1 tablet (20 mg total) by mouth daily.   . traMADol (ULTRAM) 50 MG tablet TAKE 1 TABLET BY MOUTH EVERY 6 HOURS AS NEEDED    No facility-administered encounter medications on file as of 06/06/2016.     ALLERGIES:  Allergies  Allergen Reactions  . Lisinopril      PHYSICAL EXAM:  ECOG Performance status: 1 - Symptomatic    Vitals:   06/06/16 1414  BP: (!) 160/84  Pulse: (!) 102  Resp: 16  Temp: 98.7 F (37.1 C)   Filed Weights  06/06/16 1414  Weight: 224 lb 4.8 oz (101.7 kg)    Physical Exam  Constitutional: She is oriented to person, place, and time and well-developed, well-nourished, and in no distress.  HENT:  Head: Normocephalic.  Mouth/Throat: Oropharynx is clear and moist. No oropharyngeal exudate.  Eyes: Conjunctivae are normal. Pupils are equal, round, and reactive to light. No scleral icterus.  Neck: Normal range of motion. Neck supple.  Cardiovascular: Normal rate, regular rhythm and normal heart sounds.   Pulmonary/Chest: Effort normal and breath sounds normal. No respiratory distress.    Abdominal: Soft. Bowel sounds are normal. There is no tenderness.  Musculoskeletal: Normal range of motion. She exhibits no edema.  No focal pain to spine on palpation; no paraspinal tenderness to palpation.  Lymphadenopathy:    She has no cervical adenopathy.       Right: No supraclavicular adenopathy present.       Left: No supraclavicular adenopathy present.  Neurological: She is alert and oriented to person, place, and time. No cranial nerve deficit. Gait normal.  Skin: Skin is warm and dry. No rash noted.  Psychiatric: Mood, memory, affect and judgment normal.  Nursing note and vitals reviewed.    LABORATORY DATA:  I  have reviewed the labs as listed.  CBC    Component Value Date/Time   WBC 10.0 02/15/2016 1335   RBC 4.87 02/15/2016 1335   HGB 14.1 02/15/2016 1335   HCT 42.7 02/15/2016 1335   PLT 326 02/15/2016 1335   MCV 87.7 02/15/2016 1335   MCH 29.0 02/15/2016 1335   MCHC 33.0 02/15/2016 1335   RDW 14.4 02/15/2016 1335   LYMPHSABS 3.1 02/15/2016 1335   MONOABS 0.6 02/15/2016 1335   EOSABS 0.3 02/15/2016 1335   BASOSABS 0.0 02/15/2016 1335   CMP Latest Ref Rng & Units 02/15/2016 08/15/2015 03/02/2015  Glucose 65 - 99 mg/dL 89 144(H) 482(H)  BUN 6 - 20 mg/dL 7 11 15   Creatinine 0.44 - 1.00 mg/dL 0.77 0.90 0.92  Sodium 135 - 145 mmol/L 137 142 134(L)  Potassium 3.5 - 5.1 mmol/L 3.7 4.0 4.3  Chloride 101 - 111 mmol/L 103 104 99(L)  CO2 22 - 32 mmol/L 27 - 23  Calcium 8.9 - 10.3 mg/dL 9.8 - 9.3  Total Protein 6.5 - 8.1 g/dL 8.1 - -  Total Bilirubin 0.3 - 1.2 mg/dL 0.4 - -  Alkaline Phos 38 - 126 U/L 90 - -  AST 15 - 41 U/L 21 - -  ALT 14 - 54 U/L 18 - -    PENDING LABS:    DIAGNOSTIC IMAGING:  Last mammogram: 06/03/16    DEXA scan: 06/03/16     CT chest: 08/15/2015     PATHOLOGY:           ASSESSMENT & PLAN:   Stage IA right breast cancer, ER+:  -Diagnosed in 07/2010. Treated with right breast lumpectomy, followed by adjuvant radiation therapy. She started anti-estrogen therapy with anastrozole from 11/2010 through 2017. Breast cancer index (BCI) genomic testing revealed high-risk of recurrence, as well as high likelihood of benefit of extension of therapy beyond 5 years. Anastrozole was stopped in 2017 due to arthralgias; changed to letrozole in 02/2016. Planned duration of antiestrogen therapy x 10 years, through 11/2020. -Annual mammography is up-to-date with most recent imaging done on 06/03/16 and was negative for recurrence; due for repeat mammogram in 05/2017. -Clinical breast exam is benign today. -She will be due for annual diagnostic mammogram in 05/2016.  -Given  current  improve tolerance to letrozole, she will return to cancer center in 6 months for follow-up and clinical breast exam.  Chronic low back pain:  -Low clinical suspicion that her chronic low back pain is secondary to her history of breast cancer. However, given the chronicity of her symptoms and the severity, we will obtain MRI lumbar spine for further evaluation. If results are negative for metastatic disease, which I suspect, then she may need referral to neurosurgery for additional treatment strategy. -Continue tramadol as needed; she states she just had her medication refilled and did not need any prescription today.  Bone health:  -Most recent DEXA imaging on 06/03/16 reveals continued normal bone density with T-score -0.3. She will be due for biennial DEXA imaging in 05/2018, while she remains on antiestrogen therapy. She understands that aromatase inhibitors, like letrozole, may increase her risk of osteopenia or osteoporosis. -Recommended continued calcium and vitamin D supplementation, as well as increasing weight-bearing exercises as tolerated.   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 She completed 6 months of XARELTO in July. Thrombophilia evaluation was negative. She presented to our office hypoxemic and tachpneic, repeat CTA on 08/15/2015 still showed residual pulmonary emboli.  -She is able to obtain the Xarelto at reasonable price. We will continue Xarelto as prescribed.      Dispo:  -MRI lumbar spine sometime within the next 2 weeks. -Return to cancer center in 6 months for continued follow-up.   All questions were answered to patient's stated satisfaction. Encouraged patient to call with any new concerns or questions before her next visit to the cancer center and we can certain see her sooner, if needed.     Plan of care discussed with Dr. Talbert Cage, who agrees with above  aforementioned.    Orders placed this encounter:  Orders Placed This Encounter  Procedures  . MR Lumbar Spine [place], NP [place] [phone]

## 2016-06-06 ENCOUNTER — Encounter (HOSPITAL_BASED_OUTPATIENT_CLINIC_OR_DEPARTMENT_OTHER): Payer: Medicare HMO | Admitting: Adult Health

## 2016-06-06 ENCOUNTER — Encounter (HOSPITAL_COMMUNITY): Payer: Medicare HMO | Attending: Adult Health

## 2016-06-06 ENCOUNTER — Encounter (HOSPITAL_COMMUNITY): Payer: Self-pay | Admitting: Adult Health

## 2016-06-06 VITALS — BP 160/84 | HR 102 | Temp 98.7°F | Resp 16 | Ht 62.0 in | Wt 224.3 lb

## 2016-06-06 DIAGNOSIS — Z87891 Personal history of nicotine dependence: Secondary | ICD-10-CM | POA: Insufficient documentation

## 2016-06-06 DIAGNOSIS — I10 Essential (primary) hypertension: Secondary | ICD-10-CM | POA: Insufficient documentation

## 2016-06-06 DIAGNOSIS — Z7901 Long term (current) use of anticoagulants: Secondary | ICD-10-CM

## 2016-06-06 DIAGNOSIS — Z79811 Long term (current) use of aromatase inhibitors: Secondary | ICD-10-CM

## 2016-06-06 DIAGNOSIS — Z170 Estrogen receptor positive status [ER+]: Secondary | ICD-10-CM

## 2016-06-06 DIAGNOSIS — I2699 Other pulmonary embolism without acute cor pulmonale: Secondary | ICD-10-CM

## 2016-06-06 DIAGNOSIS — Z9889 Other specified postprocedural states: Secondary | ICD-10-CM | POA: Insufficient documentation

## 2016-06-06 DIAGNOSIS — Z9851 Tubal ligation status: Secondary | ICD-10-CM | POA: Diagnosis not present

## 2016-06-06 DIAGNOSIS — C50319 Malignant neoplasm of lower-inner quadrant of unspecified female breast: Secondary | ICD-10-CM

## 2016-06-06 DIAGNOSIS — Z803 Family history of malignant neoplasm of breast: Secondary | ICD-10-CM | POA: Insufficient documentation

## 2016-06-06 DIAGNOSIS — C50911 Malignant neoplasm of unspecified site of right female breast: Secondary | ICD-10-CM | POA: Diagnosis not present

## 2016-06-06 DIAGNOSIS — Z808 Family history of malignant neoplasm of other organs or systems: Secondary | ICD-10-CM | POA: Diagnosis not present

## 2016-06-06 DIAGNOSIS — E669 Obesity, unspecified: Secondary | ICD-10-CM | POA: Insufficient documentation

## 2016-06-06 DIAGNOSIS — E119 Type 2 diabetes mellitus without complications: Secondary | ICD-10-CM | POA: Insufficient documentation

## 2016-06-06 DIAGNOSIS — G8929 Other chronic pain: Secondary | ICD-10-CM | POA: Diagnosis not present

## 2016-06-06 DIAGNOSIS — Z923 Personal history of irradiation: Secondary | ICD-10-CM | POA: Diagnosis not present

## 2016-06-06 DIAGNOSIS — M545 Low back pain: Secondary | ICD-10-CM | POA: Diagnosis not present

## 2016-06-06 DIAGNOSIS — Z7984 Long term (current) use of oral hypoglycemic drugs: Secondary | ICD-10-CM | POA: Insufficient documentation

## 2016-06-06 DIAGNOSIS — C50311 Malignant neoplasm of lower-inner quadrant of right female breast: Secondary | ICD-10-CM

## 2016-06-06 DIAGNOSIS — Z79899 Other long term (current) drug therapy: Secondary | ICD-10-CM | POA: Insufficient documentation

## 2016-06-06 LAB — CBC WITH DIFFERENTIAL/PLATELET
Basophils Absolute: 0 10*3/uL (ref 0.0–0.1)
Basophils Relative: 0 %
Eosinophils Absolute: 0.2 10*3/uL (ref 0.0–0.7)
Eosinophils Relative: 2 %
HEMATOCRIT: 41.1 % (ref 36.0–46.0)
Hemoglobin: 13.5 g/dL (ref 12.0–15.0)
LYMPHS PCT: 25 %
Lymphs Abs: 3 10*3/uL (ref 0.7–4.0)
MCH: 28.7 pg (ref 26.0–34.0)
MCHC: 32.8 g/dL (ref 30.0–36.0)
MCV: 87.3 fL (ref 78.0–100.0)
MONO ABS: 0.6 10*3/uL (ref 0.1–1.0)
MONOS PCT: 5 %
NEUTROS ABS: 8.1 10*3/uL — AB (ref 1.7–7.7)
Neutrophils Relative %: 68 %
Platelets: 306 10*3/uL (ref 150–400)
RBC: 4.71 MIL/uL (ref 3.87–5.11)
RDW: 14.3 % (ref 11.5–15.5)
WBC: 11.9 10*3/uL — ABNORMAL HIGH (ref 4.0–10.5)

## 2016-06-06 LAB — COMPREHENSIVE METABOLIC PANEL
ALT: 22 U/L (ref 14–54)
ANION GAP: 9 (ref 5–15)
AST: 23 U/L (ref 15–41)
Albumin: 3.7 g/dL (ref 3.5–5.0)
Alkaline Phosphatase: 77 U/L (ref 38–126)
BILIRUBIN TOTAL: 0.5 mg/dL (ref 0.3–1.2)
BUN: 11 mg/dL (ref 6–20)
CO2: 25 mmol/L (ref 22–32)
Calcium: 9.7 mg/dL (ref 8.9–10.3)
Chloride: 103 mmol/L (ref 101–111)
Creatinine, Ser: 0.81 mg/dL (ref 0.44–1.00)
GFR calc Af Amer: >60 mL/min (ref >60)
Glucose, Bld: 136 mg/dL — ABNORMAL HIGH (ref 65–99)
POTASSIUM: 3.8 mmol/L (ref 3.5–5.1)
Sodium: 137 mmol/L (ref 135–145)
TOTAL PROTEIN: 8.1 g/dL (ref 6.5–8.1)

## 2016-06-06 NOTE — Patient Instructions (Addendum)
[facility] at [facility] Discharge Instructions  RECOMMENDATIONS MADE BY THE CONSULTANT AND ANY TEST RESULTS WILL BE SENT TO YOUR REFERRING PHYSICIAN.  You were seen today by Mike Craze NP. MRI to be scheduled in the next couple weeks. Return in 6 months fo labs and follow up.    Thank you for choosing [place] at [facility] to provide your oncology and hematology care.  To afford each patient quality time with our provider, please arrive at least 15 minutes before your scheduled appointment time.    If you have a lab appointment with the [facility] please come in thru the  Main Entrance and check in at the main information desk  You need to re-schedule your appointment should you arrive 10 or more minutes late.  We strive to give you quality time with our providers, and arriving late affects you and other patients whose appointments are after yours.  Also, if you no show three or more times for appointments you may be dismissed from the clinic at the providers discretion.     Again, thank you for choosing [facility].  Our hope is that these requests will decrease the amount of time that you wait before being seen by our physicians.       _____________________________________________________________  Should you have questions after your visit to [facility], please contact our office at [phone] between the hours of 8:30 a.m. and 4:30 p.m.  Voicemails left after 4:30 p.m. will not be returned until the following business day.  For prescription refill requests, have your pharmacy contact our office.       Resources For Cancer Patients and their Caregivers ? American Cancer Society: Can assist with transportation, wigs, general needs, runs Look Good Feel Better.        (703)580-6346 ? Cancer Care: Provides financial assistance, online support groups, medication/co-pay assistance.  1-800-813-HOPE  515-193-3640) ? [place] Assists [place] cancer patients and their families through emotional , educational and financial support.  (251)877-0576 ? [place] DSS Where to apply for food stamps, Medicaid and utility assistance. (231)265-9154 ? [facility]: Transportation to medical appointments. (724)190-6686 ? Social Security Administration: May apply for disability if have a Stage IV cancer. (631)288-3254 343-034-4392 ? [employer], Disability and Transit Services: Assists with nutrition, care and transit needs. [place] Support Programs: @10RELATIVEDAYS @ > Cancer Support Group  2nd Tuesday of the month 1pm-2pm, Journey Room  > Creative Journey  3rd Tuesday of the month 1130am-1pm, Journey Room  > Look Good Feel Better  1st Wednesday of the month 10am-12 noon, Journey Room (Call Lynn to register [phone])

## 2016-06-18 ENCOUNTER — Encounter (HOSPITAL_COMMUNITY): Payer: Self-pay | Admitting: Lab

## 2016-06-18 ENCOUNTER — Ambulatory Visit (HOSPITAL_COMMUNITY)
Admission: RE | Admit: 2016-06-18 | Discharge: 2016-06-18 | Disposition: A | Discharge status: Home / Self Care | Payer: Medicare HMO | Source: Ambulatory Visit | Attending: Adult Health | Admitting: Adult Health

## 2016-06-18 DIAGNOSIS — M48061 Spinal stenosis, lumbar region without neurogenic claudication: Secondary | ICD-10-CM | POA: Diagnosis not present

## 2016-06-18 DIAGNOSIS — M5126 Other intervertebral disc displacement, lumbar region: Secondary | ICD-10-CM | POA: Diagnosis not present

## 2016-06-18 DIAGNOSIS — Q619 Cystic kidney disease, unspecified: Secondary | ICD-10-CM | POA: Diagnosis not present

## 2016-06-18 DIAGNOSIS — G8929 Other chronic pain: Secondary | ICD-10-CM | POA: Diagnosis not present

## 2016-06-18 DIAGNOSIS — M47896 Other spondylosis, lumbar region: Secondary | ICD-10-CM | POA: Diagnosis not present

## 2016-06-18 DIAGNOSIS — C50311 Malignant neoplasm of lower-inner quadrant of right female breast: Secondary | ICD-10-CM

## 2016-06-18 DIAGNOSIS — M545 Low back pain: Secondary | ICD-10-CM

## 2016-06-18 DIAGNOSIS — Z170 Estrogen receptor positive status [ER+]: Secondary | ICD-10-CM | POA: Diagnosis not present

## 2016-06-18 MED ORDER — GADOBENATE DIMEGLUMINE 529 MG/ML IV SOLN
20.0000 mL | Freq: Once | INTRAVENOUS | Status: AC | PRN
  Administered 2016-06-18: 20 mL via INTRAVENOUS

## 2016-06-18 NOTE — Progress Notes (Unsigned)
Referral sent to [facility]. Records faxed on 6/6.

## 2016-06-20 ENCOUNTER — Encounter (HOSPITAL_COMMUNITY): Payer: Self-pay | Admitting: Lab

## 2016-06-20 NOTE — Progress Notes (Unsigned)
Referral sent to [facility].  Records sent to 6/6

## 2016-06-25 ENCOUNTER — Other Ambulatory Visit (HOSPITAL_COMMUNITY): Payer: Self-pay | Admitting: Oncology

## 2016-06-25 DIAGNOSIS — C50319 Malignant neoplasm of lower-inner quadrant of unspecified female breast: Secondary | ICD-10-CM

## 2016-06-25 DIAGNOSIS — G8929 Other chronic pain: Secondary | ICD-10-CM

## 2016-06-25 DIAGNOSIS — M2550 Pain in unspecified joint: Secondary | ICD-10-CM

## 2016-09-25 ENCOUNTER — Other Ambulatory Visit (HOSPITAL_COMMUNITY): Payer: Self-pay | Admitting: Oncology

## 2016-09-25 DIAGNOSIS — C50319 Malignant neoplasm of lower-inner quadrant of unspecified female breast: Secondary | ICD-10-CM

## 2016-09-25 DIAGNOSIS — G8929 Other chronic pain: Secondary | ICD-10-CM

## 2016-09-25 DIAGNOSIS — M2550 Pain in unspecified joint: Secondary | ICD-10-CM

## 2016-09-25 NOTE — Telephone Encounter (Signed)
Can you please call patient to see if she is taking the Tramadol?  I cannot find record that she has had the [facility] in [place] or [facility] in several months???   Thanks! gwd

## 2016-10-02 ENCOUNTER — Encounter (HOSPITAL_COMMUNITY): Payer: Self-pay | Admitting: Adult Health

## 2016-10-02 NOTE — Progress Notes (Signed)
Received refill request from pharmacy for Xanax.   [place] Controlled Substance Reporting System reviewed and refill is appropriate. Paper prescription printed; Rx to be faxed to pharmacy per policy.   [place]CCSRS reviewed:     Mike Craze, NP [place] [phone]

## 2016-10-06 ENCOUNTER — Other Ambulatory Visit (HOSPITAL_COMMUNITY): Payer: Self-pay | Admitting: Emergency Medicine

## 2016-10-06 DIAGNOSIS — C50319 Malignant neoplasm of lower-inner quadrant of unspecified female breast: Secondary | ICD-10-CM

## 2016-10-06 MED ORDER — LETROZOLE 2.5 MG PO TABS: 2.5000 mg | ORAL_TABLET | Freq: Every day | ORAL | 3 refills | Status: DC

## 2016-11-03 ENCOUNTER — Emergency Department (HOSPITAL_COMMUNITY)
Admission: EM | Admit: 2016-11-03 | Discharge: 2016-11-03 | Disposition: A | Discharge status: Home / Self Care | Payer: Medicare HMO | Attending: Emergency Medicine | Admitting: Emergency Medicine

## 2016-11-03 ENCOUNTER — Encounter (HOSPITAL_COMMUNITY): Payer: Self-pay | Admitting: *Deleted

## 2016-11-03 DIAGNOSIS — Z87891 Personal history of nicotine dependence: Secondary | ICD-10-CM | POA: Diagnosis not present

## 2016-11-03 DIAGNOSIS — L245 Irritant contact dermatitis due to other chemical products: Secondary | ICD-10-CM

## 2016-11-03 DIAGNOSIS — I10 Essential (primary) hypertension: Secondary | ICD-10-CM | POA: Diagnosis not present

## 2016-11-03 DIAGNOSIS — E119 Type 2 diabetes mellitus without complications: Secondary | ICD-10-CM | POA: Diagnosis not present

## 2016-11-03 DIAGNOSIS — Z853 Personal history of malignant neoplasm of breast: Secondary | ICD-10-CM | POA: Diagnosis not present

## 2016-11-03 DIAGNOSIS — Z7984 Long term (current) use of oral hypoglycemic drugs: Secondary | ICD-10-CM | POA: Insufficient documentation

## 2016-11-03 DIAGNOSIS — L299 Pruritus, unspecified: Secondary | ICD-10-CM | POA: Diagnosis present

## 2016-11-03 DIAGNOSIS — Z79899 Other long term (current) drug therapy: Secondary | ICD-10-CM | POA: Insufficient documentation

## 2016-11-03 MED ORDER — DIPHENHYDRAMINE HCL 25 MG PO CAPS
25.0000 mg | ORAL_CAPSULE | Freq: Once | ORAL | Status: AC
  Administered 2016-11-03: 25 mg via ORAL
  Filled 2016-11-03: qty 1

## 2016-11-03 MED ORDER — BETAMETHASONE DIPROPIONATE 0.05 % EX CREA: TOPICAL_CREAM | Freq: Two times a day (BID) | CUTANEOUS | 0 refills | Status: DC

## 2016-11-03 MED ORDER — TRAMADOL HCL 50 MG PO TABS: 50.0000 mg | ORAL_TABLET | Freq: Four times a day (QID) | ORAL | 0 refills | Status: DC | PRN

## 2016-11-03 MED ORDER — TRAMADOL HCL 50 MG PO TABS
50.0000 mg | ORAL_TABLET | Freq: Once | ORAL | Status: AC
  Administered 2016-11-03: 50 mg via ORAL
  Filled 2016-11-03: qty 1

## 2016-11-03 NOTE — ED Triage Notes (Signed)
Pt c/o blisters and rash to bilateral hands that started Saturday. Pt reports she used a new type of dishwashing liquid recently.

## 2016-11-03 NOTE — ED Provider Notes (Signed)
[facility] EMERGENCY DEPARTMENT Provider Note   CSN: [id number] Arrival date & time: 11/03/16  1047     History   Chief Complaint Chief Complaint  Patient presents with  . Rash    HPI Alexa Castillo is a 66 y.o. female with a history of sensitive skin including "severe" eczema presenting with a pruritic rash on her bilateral hands which started 2 days ago shortly after using a new brand of dishwashing liquid. She reports blisters, itching and swelling of her fingers and hands which have responded to Benadryl and cool compresses but with transient relief. She is also using OTC anti-itch cream transient improvement. She's had no fevers or chills, denies pain.  The history is provided by the patient.    Past Medical History:  Diagnosis Date  . Arthritis    back/saw Dr. Aline Brochure  . Breast cancer ([place])   . Cancer (HCC)    breast  . Diabetes mellitus   . Hypertension   . Low back pain   . Obesity   . Sleep apnea 04/2013    Patient Active Problem List   Diagnosis Date Noted  . Chronic joint pain 10/16/2015  . Pulmonary embolism ([place]) 01/15/2015  . Chest pain on breathing 01/15/2015  . Dyspnea on exertion 01/15/2015  . Troponin level elevated 01/15/2015  . Arthritis   . Hypertension   . Essential hypertension   . PE (pulmonary thromboembolism) ([place])   . Obstructive sleep apnea syndrome in adult 07/20/2013  . Weakness of right leg 03/11/2011  . Left leg weakness 03/11/2011  . Osteoarthritis of hip 03/08/2011  . DDD (degenerative disc disease), lumbar 03/08/2011  . Right breast cancer T1bN0M0 ER Positive 2012 07/18/2010    Past Surgical History:  Procedure Laterality Date  . BIOPSY BREAST    . TUBAL LIGATION      OB History    Gravida Para Term Preterm AB Living   3 2 2   1 2    SAB TAB Ectopic Multiple Live Births   1               Home Medications    Prior to Admission medications   Medication Sig Start Date End Date Taking? Authorizing Provider    betamethasone dipropionate (DIPROLENE) 0.05 % cream Apply topically 2 (two) times daily. 11/03/16   Evalee Jefferson, PA-C  calcium-vitamin D (OSCAL WITH D) 500-200 MG-UNIT per tablet Take 1 tablet by mouth 2 (two) times daily.    [provider]  glipiZIDE (GLUCOTROL) 10 MG tablet Take 10 mg by mouth 2 (two) times daily before a meal.  06/18/10   [provider]  letrozole (FEMARA) 2.5 MG tablet Take 1 tablet (2.5 mg total) by mouth daily. 10/06/16   Twana First, MD  metFORMIN (GLUCOPHAGE) 1000 MG tablet Take 1,000 mg by mouth 2 (two) times daily. 06/20/14   [provider]  metoprolol succinate (TOPROL-XL) 25 MG 24 hr tablet Take 25 mg by mouth daily. 06/20/14   [provider]  Omega-3 Fatty Acids (FISH OIL) 1000 MG CPDR Take 1,000 mg by mouth daily.     [provider]  omeprazole (PRILOSEC) 20 MG capsule Take 20 mg by mouth every other day. 04/18/13   [provider]  rivaroxaban (XARELTO) 20 MG TABS tablet Take 1 tablet (20 mg total) by mouth daily. 03/14/16   Holley Bouche, NP  traMADol (ULTRAM) 50 MG tablet Take 1 tablet (50 mg total) by mouth every 6 (six) hours as  needed. 11/03/16   Evalee Jefferson, PA-C    Family History Family History  Problem Relation Age of Onset  . Liver cancer Mother   . Cancer Mother   . Diabetes Mother   . Breast cancer Sister   . Cancer Sister     Social History Social History  Substance Use Topics  . Smoking status: Former Smoker    Packs/day: 1.50    Years: 17.00    Types: Cigarettes    Quit date: 12/18/1994  . Smokeless tobacco: Never Used  . Alcohol use No     Allergies   Lisinopril   Review of Systems Review of Systems  Constitutional: Negative for chills and fever.  Respiratory: Negative for shortness of breath and wheezing.   Skin: Positive for rash.  Neurological: Negative for numbness.     Physical Exam Updated Vital Signs BP (!) 142/77   Pulse (!) 102   Temp 99.1 F (37.3 C)  (Oral)   Resp 20   Ht 5\' 2"  (1.575 m)   Wt 102.1 kg (225 lb)   SpO2 93%   BMI 41.15 kg/m   Physical Exam  Constitutional: She appears well-developed and well-nourished. No distress.  HENT:  Head: Normocephalic.  Neck: Neck supple.  Cardiovascular: Normal rate.   Pulmonary/Chest: Effort normal. She has no wheezes.  Musculoskeletal: Normal range of motion. She exhibits no edema.  Skin: Rash noted. Rash is vesicular.  Generalized edema bilateral hands and fingers with raised clear fluid-filled vesicles, left greater than right hand.  Rash ends distinctly at both wrists. No erythema or pustules.     ED Treatments / Results  Labs (all labs ordered are listed, but only abnormal results are displayed) Labs Reviewed - No data to display  EKG  EKG Interpretation None       Radiology No results found.  Procedures Procedures (including critical care time)  Medications Ordered in ED Medications  diphenhydrAMINE (BENADRYL) capsule 25 mg (25 mg Oral Given 11/03/16 1222)  traMADol (ULTRAM) tablet 50 mg (50 mg Oral Given 11/03/16 1222)     Initial Impression / Assessment and Plan / ED Course  I have reviewed the triage vital signs and the nursing notes.  Pertinent labs & imaging results that were available during my care of the patient were reviewed by me and considered in my medical decision making (see chart for details).     Exam c/w contact dermatitis of hands.  Benadryl, steroid cream, cool compresses.  No sign of infection.  Return precautions discussed. Pt states cbg's running 120-140.   Final Clinical Impressions(s) / ED Diagnoses   Final diagnoses:  Irritant contact dermatitis due to other chemical products    New Prescriptions Discharge Medication List as of 11/03/2016 12:24 PM    START taking these medications   Details  betamethasone dipropionate (DIPROLENE) 0.05 % cream Apply topically 2 (two) times daily., Starting Mon 11/03/2016, Print           Evalee Jefferson, PA-C 11/03/16 1655    Milton Ferguson, MD 11/04/16 [phone]

## 2016-12-11 ENCOUNTER — Other Ambulatory Visit (HOSPITAL_COMMUNITY): Payer: Self-pay | Admitting: *Deleted

## 2016-12-11 DIAGNOSIS — C50319 Malignant neoplasm of lower-inner quadrant of unspecified female breast: Secondary | ICD-10-CM

## 2016-12-12 ENCOUNTER — Telehealth: Payer: Self-pay | Admitting: Adult Health

## 2016-12-12 ENCOUNTER — Encounter (HOSPITAL_COMMUNITY): Payer: Self-pay | Admitting: Adult Health

## 2016-12-12 ENCOUNTER — Encounter (HOSPITAL_COMMUNITY): Payer: Medicare HMO | Attending: Adult Health | Admitting: Adult Health

## 2016-12-12 ENCOUNTER — Encounter (HOSPITAL_COMMUNITY): Payer: Medicare HMO

## 2016-12-12 ENCOUNTER — Other Ambulatory Visit: Payer: Self-pay

## 2016-12-12 VITALS — BP 158/81 | HR 118 | Temp 98.5°F | Resp 18 | Ht 62.0 in | Wt 220.0 lb

## 2016-12-12 DIAGNOSIS — M545 Low back pain: Secondary | ICD-10-CM | POA: Diagnosis not present

## 2016-12-12 DIAGNOSIS — N951 Menopausal and female climacteric states: Secondary | ICD-10-CM

## 2016-12-12 DIAGNOSIS — C50311 Malignant neoplasm of lower-inner quadrant of right female breast: Secondary | ICD-10-CM

## 2016-12-12 DIAGNOSIS — Z170 Estrogen receptor positive status [ER+]: Secondary | ICD-10-CM | POA: Diagnosis not present

## 2016-12-12 DIAGNOSIS — C50319 Malignant neoplasm of lower-inner quadrant of unspecified female breast: Secondary | ICD-10-CM | POA: Insufficient documentation

## 2016-12-12 DIAGNOSIS — Z23 Encounter for immunization: Secondary | ICD-10-CM

## 2016-12-12 DIAGNOSIS — C50911 Malignant neoplasm of unspecified site of right female breast: Secondary | ICD-10-CM | POA: Diagnosis not present

## 2016-12-12 DIAGNOSIS — I2699 Other pulmonary embolism without acute cor pulmonale: Secondary | ICD-10-CM | POA: Diagnosis not present

## 2016-12-12 DIAGNOSIS — D72829 Elevated white blood cell count, unspecified: Secondary | ICD-10-CM | POA: Diagnosis not present

## 2016-12-12 LAB — CBC WITH DIFFERENTIAL/PLATELET
Basophils Absolute: 0 10*3/uL (ref 0.0–0.1)
Basophils Relative: 0 %
Eosinophils Absolute: 0.2 10*3/uL (ref 0.0–0.7)
Eosinophils Relative: 2 %
HEMATOCRIT: 41.4 % (ref 36.0–46.0)
HEMOGLOBIN: 13.2 g/dL (ref 12.0–15.0)
LYMPHS ABS: 2.7 10*3/uL (ref 0.7–4.0)
LYMPHS PCT: 21 %
MCH: 28 pg (ref 26.0–34.0)
MCHC: 31.9 g/dL (ref 30.0–36.0)
MCV: 87.9 fL (ref 78.0–100.0)
Monocytes Absolute: 0.5 10*3/uL (ref 0.1–1.0)
Monocytes Relative: 4 %
NEUTROS PCT: 73 %
Neutro Abs: 9.2 10*3/uL — ABNORMAL HIGH (ref 1.7–7.7)
Platelets: 346 10*3/uL (ref 150–400)
RBC: 4.71 MIL/uL (ref 3.87–5.11)
RDW: 14.8 % (ref 11.5–15.5)
WBC: 12.6 10*3/uL — AB (ref 4.0–10.5)

## 2016-12-12 LAB — COMPREHENSIVE METABOLIC PANEL
ALK PHOS: 113 U/L (ref 38–126)
ALT: 19 U/L (ref 14–54)
AST: 24 U/L (ref 15–41)
Albumin: 3.7 g/dL (ref 3.5–5.0)
Anion gap: 11 (ref 5–15)
BUN: 10 mg/dL (ref 6–20)
CALCIUM: 9.5 mg/dL (ref 8.9–10.3)
CO2: 24 mmol/L (ref 22–32)
CREATININE: 0.74 mg/dL (ref 0.44–1.00)
Chloride: 103 mmol/L (ref 101–111)
Glucose, Bld: 199 mg/dL — ABNORMAL HIGH (ref 65–99)
Potassium: 3.8 mmol/L (ref 3.5–5.1)
Sodium: 138 mmol/L (ref 135–145)
Total Bilirubin: 0.5 mg/dL (ref 0.3–1.2)
Total Protein: 8.2 g/dL — ABNORMAL HIGH (ref 6.5–8.1)

## 2016-12-12 MED ORDER — INFLUENZA VAC SPLIT QUAD 0.5 ML IM SUSY
0.5000 mL | PREFILLED_SYRINGE | Freq: Once | INTRAMUSCULAR | Status: AC
  Administered 2016-12-12: 0.5 mL via INTRAMUSCULAR
  Filled 2016-12-12: qty 0.5

## 2016-12-12 NOTE — Progress Notes (Signed)
Pt given flu shot today in right deltoid. Pt tolerated well. Pt stable and discharged home ambulatory.

## 2016-12-12 NOTE — Patient Instructions (Addendum)
[place] at [facility] Discharge Instructions  RECOMMENDATIONS MADE BY THE CONSULTANT AND ANY TEST RESULTS WILL BE SENT TO YOUR REFERRING PHYSICIAN.  You were seen today by Mike Craze NP. Your mammogram is due in May 2019. Return in 6 months for labs and follow up.    Thank you for choosing [place] at [facility] to provide your oncology and hematology care.  To afford each patient quality time with our provider, please arrive at least 15 minutes before your scheduled appointment time.    If you have a lab appointment with the [place] [facility] please come in thru the  Main Entrance and check in at the main information desk  You need to re-schedule your appointment should you arrive 10 or more minutes late.  We strive to give you quality time with our providers, and arriving late affects you and other patients whose appointments are after yours.  Also, if you no show three or more times for appointments you may be dismissed from the clinic at the providers discretion.     Again, thank you for choosing [facility].  Our hope is that these requests will decrease the amount of time that you wait before being seen by our physicians.       _____________________________________________________________  Should you have questions after your visit to [facility], please contact our office at [phone] between the hours of 8:30 a.m. and 4:30 p.m.  Voicemails left after 4:30 p.m. will not be returned until the following business day.  For prescription refill requests, have your pharmacy contact our office.       Resources For Cancer Patients and their Caregivers ? American Cancer Society: Can assist with transportation, wigs, general needs, runs Look Good Feel Better.        212-259-3759 ? Cancer Care: Provides financial assistance, online support groups, medication/co-pay assistance.  1-800-813-HOPE  (305)748-1676) ? [place] Assists [place] cancer patients and their families through emotional , educational and financial support.  334-860-6635 ? [place] DSS Where to apply for food stamps, Medicaid and utility assistance. (279)379-0540 ? RCATS: Transportation to medical appointments. 430-270-5539 ? Social Security Administration: May apply for disability if have a Stage IV cancer. 775-823-8766 [phone] [phone] ? [employer], Disability and Transit Services: Assists with nutrition, care and transit needs. [place] Support Programs: @10RELATIVEDAYS @ > Cancer Support Group  2nd Tuesday of the month 1pm-2pm, Journey Room  > Creative Journey  3rd Tuesday of the month 1130am-1pm, Journey Room  > Look Good Feel Better  1st Wednesday of the month 10am-12 noon, Journey Room (Call [place] to register [phone])

## 2016-12-12 NOTE — Progress Notes (Signed)
[place], [place] [address]   CLINIC:  Medical Oncology/Hematology  PCP:  Abran Richard, MD [address] [place] / [place] [address] [phone]   REASON FOR VISIT:  Follow-up for Stage IA right breast cancer, ER+   CURRENT THERAPY: Letrozole 2.5 mg daily, beginning in 02/2016 (after completing ~5 years of anti-estrogen therapy with Anastrozole from 11/2010-2017)   BRIEF ONCOLOGIC HISTORY:    Right breast cancer T1bN0M0 ER Positive 2012   07/18/2010 Initial Diagnosis    Right breast cancer T1bN0M0 ER Positive 2012      07/11/2015 Pathology Results    BCI- 7.3% risk of late recurrence (5-10 years) HIGH risk, HIGH liklihood of benefit from extended endocrine therapy, with 16.5% absolute benefit, 13.5% risk of overall recurrence (0-10 years) intermediate risk, with high likelihood of benefit.        HISTORY OF PRESENT ILLNESS:  Alexa Castillo 66 y.o. female returns for follow-up of her Stage IA, ER+ right breast cancer, diagnosed in 07/2010. She underwent lumpectomy, adjuvant radiation therapy, and was anti-estrogen therapy with Anastrozole from 11/2010-2017; she stopped Anastrozole in 2017 due to arthralgias.  The Breast Cancer Index (BCI) genomic testing was sent in 06/2015 revealing high-risk of recurrence, as well as high-likelihood of benefit of extension of anti-estrogen therapy beyond 5 years.  On Xarelto for history of PE.      INTERVAL HISTORY:  Alexa Castillo 66 y.o. female is here for follow-up for history of breast cancer.   Here today unaccompanied.   Chart reviewed; she had ED visit on 11/03/16 for contact dermatitis/ezcema after washing dishes with a different dish soap at her son's house.  Her symptoms have improved since that time; "I've struggled with some form of eczema my whole life."   Overall, she tells me she has been doing "pretty good."  Appetite 100%. Energy levels 25%; she struggles with fatigue.  She has been  struggling with recent cold that she thinks she caught from her 46-year-old granddaughter ~2 weeks ago; she has some residual cough and shortness of breath, which is slowly improving.    Remains on Letrozole. She feels like she is able to tolerate it better than Anastrozole in terms of her joint aches/pains.  Continues to struggle with back pain. MRI spine was done in 06/18/16 showing multilevel spondylosis, slipped disc with central protrusion and bilateral L5 nerve root impingement.  There was no radiographic evidence of metastatic breast cancer on MRI, which was reassuring.  Remains on Tramadol PRN for back pain, which "helps some."  States that her arthralgias are manageable currently; "I will be doing some chores at home and know that I just have to take a break sometimes."    Reports occasional hot flashes but they are not severe.  Denies any vaginal dryness.  Denies any changes to her breasts; no new lumps, skin changes, or nipple discharge. Last mammogram in 05/2016 was negative for malignancy.   Remains on Xarelto; she is tolerating well. Denies any bleeding episodes including blood in her stools, hematuria, vaginal bleeding, nosebleeds, or gingival bleeding. Currently able to get Xarelto free of charge. Denies any new shortness of breath or chest pain as it relates to her history of PE. No new leg swelling or concerns for DVT.   She has not had her annual flu vaccine and is requesting it to be given today if possible.     REVIEW OF SYSTEMS:  Review of Systems  Constitutional: Positive  for fatigue. Negative for chills and fever.  HENT:  Negative.   Eyes: Negative.   Respiratory: Positive for cough and shortness of breath.        Respiratory complaints improving since recent cold virus   Cardiovascular: Negative.  Negative for leg swelling.  Gastrointestinal: Negative.  Negative for abdominal pain, blood in stool, constipation, diarrhea, nausea and vomiting.  Endocrine: Negative.     Genitourinary: Negative.  Negative for dysuria, hematuria and vaginal bleeding.   Musculoskeletal: Positive for arthralgias and back pain.  Skin: Positive for itching (improving) and rash.  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  Hematological: Negative.   Psychiatric/Behavioral: Negative.     PAST MEDICAL/SURGICAL HISTORY:  Past Medical History:  Diagnosis Date  . Arthritis    back/saw Dr. Aline Brochure  . Breast cancer ([place])   . Cancer (HCC)    breast  . Diabetes mellitus   . Hypertension   . Low back pain   . Obesity   . Sleep apnea 04/2013   Past Surgical History:  Procedure Laterality Date  . BIOPSY BREAST    . TUBAL LIGATION       SOCIAL HISTORY:  Social History   Socioeconomic History  . Marital status: Single    Spouse name: Not on file  . Number of children: Not on file  . Years of education: Not on file  . Highest education level: Not on file  Social Needs  . Financial resource strain: Not on file  . Food insecurity - worry: Not on file  . Food insecurity - inability: Not on file  . Transportation needs - medical: Not on file  . Transportation needs - non-medical: Not on file  Occupational History  . Not on file  Tobacco Use  . Smoking status: Former Smoker    Packs/day: 1.50    Years: 17.00    Pack years: 25.50    Types: Cigarettes    Last attempt to quit: 12/18/1994    Years since quitting: 22.0  . Smokeless tobacco: Never Used  Substance and Sexual Activity  . Alcohol use: No  . Drug use: No  . Sexual activity: Not on file  Other Topics Concern  . Not on file  Social History Narrative  . Not on file    FAMILY HISTORY:  Family History  Problem Relation Age of Onset  . Liver cancer Mother   . Cancer Mother   . Diabetes Mother   . Breast cancer Sister   . Cancer Sister     CURRENT MEDICATIONS:  Outpatient Encounter Medications as of 12/12/2016  Medication Sig Note  . atorvastatin (LIPITOR) 40  MG tablet    . betamethasone dipropionate (DIPROLENE) 0.05 % cream Apply topically 2 (two) times daily.   . calcium-vitamin D (OSCAL WITH D) 500-200 MG-UNIT per tablet Take 1 tablet by mouth 2 (two) times daily.   Marland Kitchen glipiZIDE (GLUCOTROL) 10 MG tablet Take 10 mg by mouth 2 (two) times daily before a meal.    . letrozole (FEMARA) 2.5 MG tablet Take 1 tablet (2.5 mg total) by mouth daily.   . metFORMIN (GLUCOPHAGE) 1000 MG tablet Take 1,000 mg by mouth 2 (two) times daily. 07/27/2014: Received from: External Pharmacy  . metoprolol succinate (TOPROL-XL) 25 MG 24 hr tablet Take 25 mg by mouth daily. 07/27/2014: Received from: External Pharmacy  . Omega-3 Fatty Acids (FISH OIL) 1000 MG CPDR Take 1,000 mg by mouth daily.    Marland Kitchen omeprazole (PRILOSEC) 20  MG capsule Take 20 mg by mouth every other day. 07/20/2013: Received from: External Pharmacy Received Sig:   . rivaroxaban (XARELTO) 20 MG TABS tablet Take 1 tablet (20 mg total) by mouth daily.   . traMADol (ULTRAM) 50 MG tablet Take 1 tablet (50 mg total) by mouth every 6 (six) hours as needed.   . [EXPIRED] Influenza vac split quadrivalent PF (FLUARIX) injection 0.5 mL     No facility-administered encounter medications on file as of 12/12/2016.     ALLERGIES:  Allergies  Allergen Reactions  . Lisinopril      PHYSICAL EXAM:  ECOG Performance status: 1 - Symptomatic; remains largely independent   Vitals:   12/12/16 1302  BP: (!) 158/81  Pulse: (!) 118  Resp: 18  Temp: 98.5 F (36.9 C)  SpO2: 91%   Filed Weights   12/12/16 1302  Weight: 220 lb (99.8 kg)    Physical Exam  Constitutional: She is oriented to person, place, and time and well-developed, well-nourished, and in no distress.  HENT:  Head: Normocephalic.  Mouth/Throat: Oropharynx is clear and moist. No oropharyngeal exudate.  Eyes: Conjunctivae are normal. Pupils are equal, round, and reactive to light. No scleral icterus.  Neck: Normal range of motion. Neck supple.   Cardiovascular: Normal rate and regular rhythm.  Pulmonary/Chest: Effort normal and breath sounds normal. No respiratory distress.    Abdominal: Soft. Bowel sounds are normal. There is no tenderness.  Musculoskeletal: Normal range of motion. She exhibits no edema.  Lymphadenopathy:    She has no cervical adenopathy.       Right: No supraclavicular adenopathy present.       Left: No supraclavicular adenopathy present.  Neurological: She is alert and oriented to person, place, and time. No cranial nerve deficit.  Skin: Skin is warm and dry. No rash noted.  Psychiatric: Mood, memory, affect and judgment normal.  Nursing note and vitals reviewed.    LABORATORY DATA:  I have reviewed the labs as listed.  CBC    Component Value Date/Time   WBC 12.6 (H) 12/12/2016 1159   RBC 4.71 12/12/2016 1159   HGB 13.2 12/12/2016 1159   HCT 41.4 12/12/2016 1159   PLT 346 12/12/2016 1159   MCV 87.9 12/12/2016 1159   MCH 28.0 12/12/2016 1159   MCHC 31.9 12/12/2016 1159   RDW 14.8 12/12/2016 1159   LYMPHSABS 2.7 12/12/2016 1159   MONOABS 0.5 12/12/2016 1159   EOSABS 0.2 12/12/2016 1159   BASOSABS 0.0 12/12/2016 1159   CMP Latest Ref Rng & Units 12/12/2016 06/06/2016 02/15/2016  Glucose 65 - 99 mg/dL 199(H) 136(H) 89  BUN 6 - 20 mg/dL 10 11 7   Creatinine 0.44 - 1.00 mg/dL 0.74 0.81 0.77  Sodium 135 - 145 mmol/L 138 137 137  Potassium 3.5 - 5.1 mmol/L 3.8 3.8 3.7  Chloride 101 - 111 mmol/L 103 103 103  CO2 22 - 32 mmol/L 24 25 27   Calcium 8.9 - 10.3 mg/dL 9.5 9.7 9.8  Total Protein 6.5 - 8.1 g/dL 8.2(H) 8.1 8.1  Total Bilirubin 0.3 - 1.2 mg/dL 0.5 0.5 0.4  Alkaline Phos 38 - 126 U/L 113 77 90  AST 15 - 41 U/L 24 23 21   ALT 14 - 54 U/L 19 22 18     PENDING LABS:    DIAGNOSTIC IMAGING:  Last mammogram: 06/03/16    DEXA scan: 06/03/16     CT chest: 08/15/2015     PATHOLOGY:  ASSESSMENT & PLAN:   Stage IA right breast cancer, ER+:  -Diagnosed in 07/2010.  Treated with right breast lumpectomy, followed by adjuvant radiation therapy. She started anti-estrogen therapy with anastrozole from 11/2010 through 2017. Breast cancer index (BCI) genomic testing revealed high-risk of recurrence, as well as high likelihood of benefit of extension of therapy beyond 5 years. Anastrozole was stopped in 2017 due to arthralgias; changed to Letrozole in 02/2016.  -Remains on Letrozole daily with improved tolerance after stopping Anastrozole d/t joint aches/pains.  Will plan to continue anti-estrogen therapy through 11/2020 for total of 10 years given BCI results showing HIGH risk of recurrence and HIGH likelihood of benefit of extension of therapy beyond 5 years.  -Clinical breast exam performed today and benign.  -Last mammogram in 05/2016 negative for malignancy; she will be due for what will likely be her last diagnostic mammogram in 05/2017. Explained to her that radiology protocol recommends diagnostic mammogram annually for 7 years post-lumpectomy, then resume screening mammogram annually. We briefly discussed the difference between these 2 types of mammogram.  Orders placed for diagnostic mammogram to be done in 05/2017.  -Return to cancer center in 6 months for follow-up with labs.   Chronic low back pain:  -MRI spine in 06/2016 showed multilevel spondylosis, slipped disc with central protrusion and bilateral L5 nerve root impingement. She was referred to neurosurgery for additional evaluation.  -Per patient, her pain is currently manageable. She is able to perform ADLs.   -Continue Tramadol as needed; she did not request refill today  Bone health:  -Most recent DEXA imaging on 06/03/16 reveals continued normal bone density with T-score -0.3. Due for biennial DEXA imaging in 05/2018, while she remains on antiestrogen therapy; will order DEXA scan at subsequent follow-up visits.  -Recommended continued calcium and vitamin D supplementation, as well as increasing  weight-bearing exercises as tolerated.   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 She completed 6 months of XARELTO in July. Thrombophilia evaluation was negative. She presented to our office hypoxemic and tachpneic, repeat CTA on 08/15/2015 still showed residual pulmonary emboli. Recommended continuation of Xarelto at that time.  -Discussed with Dr. Talbert Cage. Will repeat CTA chest sometime with the next month or so; if PE is resolved, then we can stop anti-coagulation since this was her first thromboembolic event.  She agreed with this plan.  Orders placed for CTA chest today and we will call her with the results when they are available.   Mild leukocytosis:  -WBCs 12.6 today. May be d/t recent viral illness.  Will keep monitoring for now with repeat labs in 6 months.   Health maintenance:  -Annual flu vaccine given today by nursing per patient request.         Dispo:  -CTA chest sometime within the next 1 month or so; orders placed today.  -Annual mammogram in 05/2017; orders placed today.  -Return to cancer center in 6 months a few days after mammogram for follow-up with labs.    All questions were answered to patient's stated satisfaction. Encouraged patient to call with any new concerns or questions before her next visit to the cancer center and we can certain see her sooner, if needed.     Plan of care discussed with Dr. Talbert Cage, who agrees with above aforementioned.    Orders placed this encounter:  Orders Placed This Encounter  Procedures  . CT ANGIO CHEST PE W OR WO  CONTRAST  . MM DIAG BREAST TOMO BILATERAL  . CBC with Differential/Platelet  . Comprehensive metabolic panel     Mike Craze, NP [place] [phone]

## 2016-12-15 ENCOUNTER — Ambulatory Visit (HOSPITAL_COMMUNITY): Payer: Medicare HMO

## 2016-12-25 ENCOUNTER — Ambulatory Visit (HOSPITAL_COMMUNITY)
Admission: RE | Admit: 2016-12-25 | Discharge: 2016-12-25 | Disposition: A | Discharge status: Home / Self Care | Payer: Medicare HMO | Source: Ambulatory Visit | Attending: Adult Health | Admitting: Adult Health

## 2016-12-25 DIAGNOSIS — I2699 Other pulmonary embolism without acute cor pulmonale: Secondary | ICD-10-CM

## 2016-12-25 DIAGNOSIS — I700 Atherosclerosis of aorta: Secondary | ICD-10-CM | POA: Diagnosis not present

## 2016-12-25 DIAGNOSIS — J479 Bronchiectasis, uncomplicated: Secondary | ICD-10-CM | POA: Diagnosis not present

## 2016-12-25 DIAGNOSIS — R590 Localized enlarged lymph nodes: Secondary | ICD-10-CM | POA: Diagnosis not present

## 2016-12-25 DIAGNOSIS — J432 Centrilobular emphysema: Secondary | ICD-10-CM | POA: Diagnosis not present

## 2016-12-25 MED ORDER — IOPAMIDOL (ISOVUE-370) INJECTION 76%
100.0000 mL | Freq: Once | INTRAVENOUS | Status: AC | PRN
  Administered 2016-12-25: 100 mL via INTRAVENOUS

## 2017-06-01 ENCOUNTER — Other Ambulatory Visit: Payer: Self-pay | Admitting: Adult Health

## 2017-06-01 ENCOUNTER — Other Ambulatory Visit (HOSPITAL_COMMUNITY): Payer: Self-pay | Admitting: Internal Medicine

## 2017-06-01 DIAGNOSIS — Z1239 Encounter for other screening for malignant neoplasm of breast: Secondary | ICD-10-CM

## 2017-06-01 DIAGNOSIS — Z170 Estrogen receptor positive status [ER+]: Principal | ICD-10-CM

## 2017-06-01 DIAGNOSIS — C50311 Malignant neoplasm of lower-inner quadrant of right female breast: Secondary | ICD-10-CM

## 2017-06-09 ENCOUNTER — Other Ambulatory Visit (HOSPITAL_COMMUNITY): Payer: Medicare HMO

## 2017-06-09 ENCOUNTER — Encounter (HOSPITAL_COMMUNITY): Payer: Medicare HMO

## 2017-06-11 ENCOUNTER — Inpatient Hospital Stay (HOSPITAL_COMMUNITY): Payer: Medicare PPO | Attending: Hematology

## 2017-06-11 ENCOUNTER — Encounter (HOSPITAL_COMMUNITY): Payer: Self-pay

## 2017-06-11 ENCOUNTER — Ambulatory Visit (HOSPITAL_COMMUNITY): Payer: Medicare HMO

## 2017-06-11 ENCOUNTER — Ambulatory Visit (HOSPITAL_COMMUNITY)
Admission: RE | Admit: 2017-06-11 | Discharge: 2017-06-11 | Disposition: A | Discharge status: Home / Self Care | Payer: Medicare PPO | Source: Ambulatory Visit | Attending: Adult Health | Admitting: Adult Health

## 2017-06-11 ENCOUNTER — Other Ambulatory Visit: Payer: Self-pay | Admitting: Adult Health

## 2017-06-11 DIAGNOSIS — C50911 Malignant neoplasm of unspecified site of right female breast: Secondary | ICD-10-CM | POA: Diagnosis present

## 2017-06-11 DIAGNOSIS — Z1231 Encounter for screening mammogram for malignant neoplasm of breast: Secondary | ICD-10-CM

## 2017-06-11 DIAGNOSIS — Z170 Estrogen receptor positive status [ER+]: Secondary | ICD-10-CM

## 2017-06-11 DIAGNOSIS — C50311 Malignant neoplasm of lower-inner quadrant of right female breast: Secondary | ICD-10-CM

## 2017-06-11 LAB — CBC WITH DIFFERENTIAL/PLATELET
Basophils Absolute: 0 10*3/uL (ref 0.0–0.1)
Basophils Relative: 0 %
Eosinophils Absolute: 0.3 10*3/uL (ref 0.0–0.7)
Eosinophils Relative: 2 %
HCT: 40.7 % (ref 36.0–46.0)
HEMOGLOBIN: 12.9 g/dL (ref 12.0–15.0)
LYMPHS ABS: 3.1 10*3/uL (ref 0.7–4.0)
LYMPHS PCT: 26 %
MCH: 28 pg (ref 26.0–34.0)
MCHC: 31.7 g/dL (ref 30.0–36.0)
MCV: 88.3 fL (ref 78.0–100.0)
Monocytes Absolute: 0.6 10*3/uL (ref 0.1–1.0)
Monocytes Relative: 5 %
NEUTROS PCT: 67 %
Neutro Abs: 8.2 10*3/uL — ABNORMAL HIGH (ref 1.7–7.7)
Platelets: 306 10*3/uL (ref 150–400)
RBC: 4.61 MIL/uL (ref 3.87–5.11)
RDW: 15.4 % (ref 11.5–15.5)
WBC: 12.2 10*3/uL — AB (ref 4.0–10.5)

## 2017-06-11 LAB — COMPREHENSIVE METABOLIC PANEL
ALBUMIN: 3.8 g/dL (ref 3.5–5.0)
ALK PHOS: 88 U/L (ref 38–126)
ALT: 29 U/L (ref 14–54)
ANION GAP: 11 (ref 5–15)
AST: 26 U/L (ref 15–41)
BILIRUBIN TOTAL: 0.5 mg/dL (ref 0.3–1.2)
BUN: 16 mg/dL (ref 6–20)
CO2: 23 mmol/L (ref 22–32)
Calcium: 10.1 mg/dL (ref 8.9–10.3)
Chloride: 105 mmol/L (ref 101–111)
Creatinine, Ser: 0.74 mg/dL (ref 0.44–1.00)
GFR calc Af Amer: >60 mL/min (ref >60)
GFR calc non Af Amer: >60 mL/min (ref >60)
GLUCOSE: 82 mg/dL (ref 65–99)
Potassium: 4.4 mmol/L (ref 3.5–5.1)
SODIUM: 139 mmol/L (ref 135–145)
Total Protein: 7.9 g/dL (ref 6.5–8.1)

## 2017-06-12 ENCOUNTER — Ambulatory Visit (HOSPITAL_COMMUNITY): Payer: Medicare HMO | Admitting: Internal Medicine

## 2017-06-15 ENCOUNTER — Other Ambulatory Visit: Payer: Self-pay

## 2017-06-15 ENCOUNTER — Encounter (HOSPITAL_COMMUNITY): Payer: Self-pay | Admitting: Internal Medicine

## 2017-06-15 ENCOUNTER — Inpatient Hospital Stay (HOSPITAL_COMMUNITY): Payer: Medicare PPO | Attending: Internal Medicine | Admitting: Internal Medicine

## 2017-06-15 VITALS — BP 139/75 | HR 118 | Temp 98.2°F | Resp 20 | Wt 224.4 lb

## 2017-06-15 DIAGNOSIS — Z86711 Personal history of pulmonary embolism: Secondary | ICD-10-CM | POA: Diagnosis not present

## 2017-06-15 DIAGNOSIS — Z170 Estrogen receptor positive status [ER+]: Secondary | ICD-10-CM | POA: Diagnosis not present

## 2017-06-15 DIAGNOSIS — C50911 Malignant neoplasm of unspecified site of right female breast: Secondary | ICD-10-CM | POA: Insufficient documentation

## 2017-06-15 DIAGNOSIS — D72829 Elevated white blood cell count, unspecified: Secondary | ICD-10-CM | POA: Insufficient documentation

## 2017-06-15 DIAGNOSIS — Z87891 Personal history of nicotine dependence: Secondary | ICD-10-CM | POA: Diagnosis not present

## 2017-06-15 DIAGNOSIS — Z7901 Long term (current) use of anticoagulants: Secondary | ICD-10-CM | POA: Diagnosis not present

## 2017-06-15 DIAGNOSIS — D72828 Other elevated white blood cell count: Secondary | ICD-10-CM

## 2017-06-15 NOTE — Patient Instructions (Signed)
[place] at [facility]: [place] [facility] Discharge Instructions   You were seen today by Dr. Zoila Shutter. She discussed your recent mammogram and everything looked good.  We will send you back to GI for follow up and schedule your colonoscopy. Your next mammogram will be due in 1 year. Return in 6 months for follow up.    Thank you for choosing [place] at [facility] to provide your oncology and hematology care.  To afford each patient quality time with our provider, please arrive at least 15 minutes before your scheduled appointment time.    If you have a lab appointment with the [facility] please come in thru the  Main Entrance and check in at the main information desk  You need to re-schedule your appointment should you arrive 10 or more minutes late.  We strive to give you quality time with our providers, and arriving late affects you and other patients whose appointments are after yours.  Also, if you no show three or more times for appointments you may be dismissed from the clinic at the providers discretion.     Again, thank you for choosing [facility].  Our hope is that these requests will decrease the amount of time that you wait before being seen by our physicians.       _____________________________________________________________  Should you have questions after your visit to [facility], please contact our office at [phone] between the hours of 8:30 a.m. and 4:30 p.m.  Voicemails left after 4:30 p.m. will not be returned until the following business day.  For prescription refill requests, have your pharmacy contact our office.       Resources For Cancer Patients and their Caregivers ? American Cancer Society: Can assist with transportation, wigs, general needs, runs Look Good Feel Better.        [phone] ? Cancer Care: Provides financial assistance, online support groups, medication/co-pay  assistance.  1-800-813-HOPE 507-669-0030) ? [place] Assists [place] cancer patients and their families through emotional , educational and financial support.  [phone] ? [place] DSS Where to apply for food stamps, Medicaid and utility assistance. 321-504-1239 ? [employer]: Transportation to medical appointments. 256-618-1082 ? Social Security Administration: May apply for disability if have a Stage IV cancer. 2066489401 8030219885 ? [employer], Disability and Transit Services: Assists with nutrition, care and transit needs. [place] Support Programs:   > Cancer Support Group  2nd Tuesday of the month 1pm-2pm, Journey Room   > Creative Journey  3rd Tuesday of the month 1130am-1pm, Journey Room

## 2017-06-15 NOTE — Progress Notes (Signed)
REASON FOR VISIT:  Follow-up for Stage IA right breast cancer, ER+   CURRENT THERAPY: Letrozole 2.5 mg daily, beginning in 02/2016 (after completing ~5 years of anti-estrogen therapy with Anastrozole from 11/2010-2017)   Assessment and Plan: 1.  Stage IA right breast cancer, ER+.  Pt was diagnosed in 07/2010. Treated with right breast lumpectomy, followed by adjuvant radiation therapy. She started anti-estrogen therapy with anastrozole from 11/2010 through 2017. Breast cancer index (BCI) genomic testing revealed high-risk of recurrence, as well as high likelihood of benefit of extension of therapy beyond 5 years. Anastrozole was stopped in 2017 due to arthralgias; changed to Letrozole in 02/2016.   She is here to go over screening mammogram that was done 06/11/2017 and was negative.  She is recommended for bilateral screening mammogram in 05/2018.    Pt remains on Letrozole daily with improved tolerance after stopping Anastrozole d/t joint aches/pains.  She should continue Femara through 11/2020 for total of 10 years given BCI results showing HIGH risk of recurrence and HIGH likelihood of benefit of extension of therapy beyond 5 years.   Pt will be seen for follow-up in 6 months with repeat labs.    2.  Leucocytosis. Labs done 06/11/2017 reviewed with pt and show WBC 12.2 which is stable, HB 12.9 and plts 306,000.  Cr is 0.74.  WBC has been mildly elevated going back to 2017 when it was 14.3.  Suspect a normal varient.  Pt also lists steroid cream in Med list.  Will repeat labs in 12/2017 and check Jak 2 and BCR/ABL at that time.      3.  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  Pt had Hospital admission on 01/15/2015 through 01/18/2015 for PE, cardiac strain noted on CT. She completed 6 months of XARELTO in July. Thrombophilia evaluation was negative. She presented to our office hypoxemic and tachpneic, repeat CTA on 08/15/2015 still showed residual pulmonary emboli. Recommended continuation of Xarelto  at that time.   Pt had CTA done 12/2016 that showed IMPRESSION: 1. No occlusive pulmonary embolus is identified. Thin, wispy, web-like filling defects are noted within lower lobe pulmonary arteries that likely reflect some residual nonocclusive thromboemboli. 2. Stable mildly enlarged mediastinal and bilateral hilar lymph nodes possibly reactive. 3. Upper lobe predominant centrilobular emphysema with bronchiectasis in the lower lobes. 4. Nonspecific free intraparenchymal right lower lobe perihilar airspace opacities may reflect a small focus of infection or inflammation. 5. Stable 3 and 4 mm left lower lobe pulmonary nodules dating back to January, 2017 comparisons, likely to represent benign findings given stability.  She was instructed at that time by NP Renato Battles to discontinue Xarelto.  She denies any respiratory symptoms.  Will continue to assess on Follow-up.  Low threshold for repeat imaging with CTA is symptomatic.    4.  Health maintenance.  Pt had bone density done 06/03/2016 that was normal.  She should continue Calcium and vitamin D as recommended.  She will have repeat imaging in 05/2018.    Interval History:  67 yr old  Y.o. female followed by NP Renato Battles for  Stage IA, ER+ right breast cancer, diagnosed in 07/2010. She underwent lumpectomy, adjuvant radiation therapy, and was anti-estrogen therapy with Anastrozole from 11/2010-2017; she stopped Anastrozole in 2017 due to arthralgias.  The Breast Cancer Index (BCI) genomic testing was sent in 06/2015 revealing high-risk of recurrence, as well as high-likelihood of benefit of extension of anti-estrogen therapy beyond 5 years.  On Xarelto for history of PE.   Current Status:  Pt is seen today for follow-up.  She is here to go over labs and mammogram results.       Right breast cancer T1bN0M0 ER Positive 2012   07/18/2010 Initial Diagnosis    Right breast cancer T1bN0M0 ER Positive 2012      07/11/2015 Pathology Results    BCI- 7.3%  risk of late recurrence (5-10 years) HIGH risk, HIGH liklihood of benefit from extended endocrine therapy, with 16.5% absolute benefit, 13.5% risk of overall recurrence (0-10 years) intermediate risk, with high likelihood of benefit.        Problem List Patient Active Problem List   Diagnosis Date Noted  . Chronic joint pain [M25.50, G89.29] 10/16/2015  . Pulmonary embolism ([place]) [I26.99] 01/15/2015  . Chest pain on breathing [R07.1] 01/15/2015  . Dyspnea on exertion [R06.09] 01/15/2015  . Troponin level elevated [R74.8] 01/15/2015  . Arthritis [M19.90]   . Hypertension [I10]   . Essential hypertension [I10]   . PE (pulmonary thromboembolism) ([place]) [I26.99]   . Obstructive sleep apnea syndrome in adult [G47.33] 07/20/2013  . Weakness of right leg [R29.898] 03/11/2011  . Left leg weakness [R29.898] 03/11/2011  . Osteoarthritis of hip [M16.9] 03/08/2011  . DDD (degenerative disc disease), lumbar [M51.36] 03/08/2011  . Right breast cancer T1bN0M0 ER Positive 2012 [C50.319] 07/18/2010    Past Medical History Past Medical History:  Diagnosis Date  . Arthritis    back/saw Dr. Aline Brochure  . Breast cancer ([place])   . Cancer (HCC)    breast  . Diabetes mellitus   . Hypertension   . Low back pain   . Obesity   . Sleep apnea 04/2013    Past Surgical History Past Surgical History:  Procedure Laterality Date  . BIOPSY BREAST    . TUBAL LIGATION      Family History Family History  Problem Relation Age of Onset  . Liver cancer Mother   . Cancer Mother   . Diabetes Mother   . Breast cancer Sister   . Cancer Sister      Social History  reports that she quit smoking about 22 years ago. Her smoking use included cigarettes. She has a 25.50 pack-year smoking history. She has never used smokeless tobacco. She reports that she does not drink alcohol or use drugs.  Medications  Current Outpatient Medications:  .  atorvastatin (LIPITOR) 40 MG tablet, , Disp: , Rfl:  .   betamethasone dipropionate (DIPROLENE) 0.05 % cream, Apply topically 2 (two) times daily., Disp: 30 g, Rfl: 0 .  calcium-vitamin D (OSCAL WITH D) 500-200 MG-UNIT per tablet, Take 1 tablet by mouth 2 (two) times daily., Disp: , Rfl:  .  glipiZIDE (GLUCOTROL) 10 MG tablet, Take 10 mg by mouth 2 (two) times daily before a meal. , Disp: , Rfl:  .  letrozole (FEMARA) 2.5 MG tablet, Take 1 tablet (2.5 mg total) by mouth daily., Disp: 90 tablet, Rfl: 3 .  metFORMIN (GLUCOPHAGE) 1000 MG tablet, Take 1,000 mg by mouth 2 (two) times daily., Disp: , Rfl: 1 .  metoprolol succinate (TOPROL-XL) 25 MG 24 hr tablet, Take 25 mg by mouth daily., Disp: , Rfl: 1 .  Omega-3 Fatty Acids (FISH OIL) 1000 MG CPDR, Take 1,000 mg by mouth daily. , Disp: , Rfl:  .  omeprazole (PRILOSEC) 20 MG capsule, Take 20 mg by mouth every other day., Disp: , Rfl:  .  rivaroxaban (XARELTO) 20 MG TABS tablet, Take 1 tablet (20 mg total) by mouth daily., Disp: 90  tablet, Rfl: 3 .  traMADol (ULTRAM) 50 MG tablet, Take 1 tablet (50 mg total) by mouth every 6 (six) hours as needed., Disp: 20 tablet, Rfl: 0  Allergies Lisinopril  Review of Systems Review of Systems - Oncology ROS as per HPI otherwise 12 point ROS is negative.   Physical Exam  Vitals Wt Readings from Last 3 Encounters:  12/12/16 220 lb (99.8 kg)  11/03/16 225 lb (102.1 kg)  06/06/16 224 lb 4.8 oz (101.7 kg)   Temp Readings from Last 3 Encounters:  12/12/16 98.5 F (36.9 C) (Oral)  11/03/16 99.1 F (37.3 C) (Oral)  06/06/16 98.7 F (37.1 C) (Oral)   BP Readings from Last 3 Encounters:  12/12/16 (!) 158/81  11/03/16 (!) 142/77  06/06/16 (!) 160/84   Pulse Readings from Last 3 Encounters:  12/12/16 (!) 118  11/03/16 (!) 102  06/06/16 (!) 102   Constitutional: Well-developed, well-nourished, and in no distress.   HENT: Head: Normocephalic and atraumatic.  Mouth/Throat: No oropharyngeal exudate. Mucosa moist. Eyes: Pupils are equal, round, and reactive  to light. Conjunctivae are normal. No scleral icterus.  Neck: Normal range of motion. Neck supple. No JVD present.  Cardiovascular: Normal rate, regular rhythm and normal heart sounds.  Exam reveals no gallop and no friction rub.   No murmur heard. Pulmonary/Chest: Effort normal and breath sounds normal. No respiratory distress. No wheezes.No rales.  Abdominal: Soft. Bowel sounds are normal. No distension. There is no tenderness. There is no guarding.  Musculoskeletal: No edema or tenderness.  Lymphadenopathy: No cervical, axillary or supraclavicular adenopathy.  Neurological: Alert and oriented to person, place, and time. No cranial nerve deficit.  Skin: Skin is warm and dry. No rash noted. No erythema. No pallor.  Psychiatric: Affect and judgment normal.  Bilateral breast exam:  Chaperone present.  No dominant masses palpable bilaterally.  Lumpectomy and RT changes noted on right breast.    Labs No visits with results within 3 Day(s) from this visit.  Latest known visit with results is:  Appointment on 06/11/2017  Component Date Value Ref Range Status  . Sodium 06/11/2017 139  135 - 145 mmol/L Final  . Potassium 06/11/2017 4.4  3.5 - 5.1 mmol/L Final  . Chloride 06/11/2017 105  101 - 111 mmol/L Final  . CO2 06/11/2017 23  22 - 32 mmol/L Final  . Glucose, Bld 06/11/2017 82  65 - 99 mg/dL Final  . BUN 06/11/2017 16  6 - 20 mg/dL Final  . Creatinine, Ser 06/11/2017 0.74  0.44 - 1.00 mg/dL Final  . Calcium 06/11/2017 10.1  8.9 - 10.3 mg/dL Final  . Total Protein 06/11/2017 7.9  6.5 - 8.1 g/dL Final  . Albumin 06/11/2017 3.8  3.5 - 5.0 g/dL Final  . AST 06/11/2017 26  15 - 41 U/L Final  . ALT 06/11/2017 29  14 - 54 U/L Final  . Alkaline Phosphatase 06/11/2017 88  38 - 126 U/L Final  . Total Bilirubin 06/11/2017 0.5  0.3 - 1.2 mg/dL Final  . GFR calc non Af Amer 06/11/2017 >60  >60 mL/min Final  . GFR calc Af Amer 06/11/2017 >60  >60 mL/min Final   Comment: (NOTE) The eGFR has been  calculated using the CKD EPI equation. This calculation has not been validated in all clinical situations. eGFR's persistently <60 mL/min signify possible Chronic Kidney Disease.   Georgiann Hahn gap 06/11/2017 11  5 - 15 Final   Performed at [facility], [address] [place], [place]  [address]  . WBC 06/11/2017 12.2* 4.0 - 10.5 K/uL Final  . RBC 06/11/2017 4.61  3.87 - 5.11 MIL/uL Final  . Hemoglobin 06/11/2017 12.9  12.0 - 15.0 g/dL Final  . HCT 06/11/2017 40.7  36.0 - 46.0 % Final  . MCV 06/11/2017 88.3  78.0 - 100.0 fL Final  . MCH 06/11/2017 28.0  26.0 - 34.0 pg Final  . MCHC 06/11/2017 31.7  30.0 - 36.0 g/dL Final  . RDW 06/11/2017 15.4  11.5 - 15.5 % Final  . Platelets 06/11/2017 306  150 - 400 K/uL Final  . Neutrophils Relative % 06/11/2017 67  % Final  . Neutro Abs 06/11/2017 8.2* 1.7 - 7.7 K/uL Final  . Lymphocytes Relative 06/11/2017 26  % Final  . Lymphs Abs 06/11/2017 3.1  0.7 - 4.0 K/uL Final  . Monocytes Relative 06/11/2017 5  % Final  . Monocytes Absolute 06/11/2017 0.6  0.1 - 1.0 K/uL Final  . Eosinophils Relative 06/11/2017 2  % Final  . Eosinophils Absolute 06/11/2017 0.3  0.0 - 0.7 K/uL Final  . Basophils Relative 06/11/2017 0  % Final  . Basophils Absolute 06/11/2017 0.0  0.0 - 0.1 K/uL Final   Performed at [facility], [address], [place], [place] [address]     Pathology No orders of the defined types were placed in this encounter.      Zoila Shutter MD

## 2017-10-29 ENCOUNTER — Telehealth (HOSPITAL_COMMUNITY): Payer: Self-pay | Admitting: *Deleted

## 2017-10-29 ENCOUNTER — Other Ambulatory Visit (HOSPITAL_COMMUNITY): Payer: Self-pay | Admitting: Nurse Practitioner

## 2017-10-29 DIAGNOSIS — C50319 Malignant neoplasm of lower-inner quadrant of unspecified female breast: Secondary | ICD-10-CM

## 2017-10-29 MED ORDER — LETROZOLE 2.5 MG PO TABS: 2.5000 mg | ORAL_TABLET | Freq: Every day | ORAL | 4 refills | Status: DC

## 2017-11-02 ENCOUNTER — Other Ambulatory Visit (HOSPITAL_COMMUNITY): Payer: Self-pay | Admitting: *Deleted

## 2017-12-08 ENCOUNTER — Inpatient Hospital Stay (HOSPITAL_COMMUNITY): Payer: Medicare PPO | Attending: Hematology

## 2017-12-08 DIAGNOSIS — C50911 Malignant neoplasm of unspecified site of right female breast: Secondary | ICD-10-CM | POA: Insufficient documentation

## 2017-12-08 DIAGNOSIS — D72828 Other elevated white blood cell count: Secondary | ICD-10-CM

## 2017-12-08 LAB — CBC WITH DIFFERENTIAL/PLATELET
Abs Immature Granulocytes: 0.04 10*3/uL (ref 0.00–0.07)
BASOS ABS: 0 10*3/uL (ref 0.0–0.1)
BASOS PCT: 0 %
EOS ABS: 0.2 10*3/uL (ref 0.0–0.5)
Eosinophils Relative: 2 %
HCT: 43.5 % (ref 36.0–46.0)
Hemoglobin: 13.7 g/dL (ref 12.0–15.0)
IMMATURE GRANULOCYTES: 0 %
Lymphocytes Relative: 21 %
Lymphs Abs: 2.1 10*3/uL (ref 0.7–4.0)
MCH: 28.1 pg (ref 26.0–34.0)
MCHC: 31.5 g/dL (ref 30.0–36.0)
MCV: 89.3 fL (ref 80.0–100.0)
Monocytes Absolute: 0.6 10*3/uL (ref 0.1–1.0)
Monocytes Relative: 6 %
NEUTROS PCT: 71 %
NRBC: 0 % (ref 0.0–0.2)
Neutro Abs: 7.3 10*3/uL (ref 1.7–7.7)
PLATELETS: 285 10*3/uL (ref 150–400)
RBC: 4.87 MIL/uL (ref 3.87–5.11)
RDW: 14.6 % (ref 11.5–15.5)
WBC: 10.2 10*3/uL (ref 4.0–10.5)

## 2017-12-08 LAB — COMPREHENSIVE METABOLIC PANEL
ALBUMIN: 3.9 g/dL (ref 3.5–5.0)
ALT: 23 U/L (ref 0–44)
AST: 24 U/L (ref 15–41)
Alkaline Phosphatase: 101 U/L (ref 38–126)
Anion gap: 9 (ref 5–15)
BUN: 10 mg/dL (ref 8–23)
CHLORIDE: 108 mmol/L (ref 98–111)
CO2: 25 mmol/L (ref 22–32)
Calcium: 9.9 mg/dL (ref 8.9–10.3)
Creatinine, Ser: 0.74 mg/dL (ref 0.44–1.00)
GFR calc Af Amer: >60 mL/min (ref >60)
GLUCOSE: 176 mg/dL — AB (ref 70–99)
POTASSIUM: 4.1 mmol/L (ref 3.5–5.1)
Sodium: 142 mmol/L (ref 135–145)
Total Bilirubin: 0.7 mg/dL (ref 0.3–1.2)
Total Protein: 8.1 g/dL (ref 6.5–8.1)

## 2017-12-08 LAB — LACTATE DEHYDROGENASE: LDH: 145 U/L (ref 98–192)

## 2017-12-15 ENCOUNTER — Ambulatory Visit (HOSPITAL_COMMUNITY): Payer: Medicare PPO | Admitting: Hematology

## 2017-12-15 ENCOUNTER — Other Ambulatory Visit: Payer: Self-pay

## 2017-12-15 ENCOUNTER — Encounter (HOSPITAL_COMMUNITY): Payer: Self-pay | Admitting: Internal Medicine

## 2017-12-15 ENCOUNTER — Inpatient Hospital Stay (HOSPITAL_COMMUNITY): Payer: Medicare PPO | Attending: Hematology | Admitting: Internal Medicine

## 2017-12-15 VITALS — BP 158/83 | HR 94 | Temp 98.8°F | Resp 16 | Wt 227.0 lb

## 2017-12-15 DIAGNOSIS — D72829 Elevated white blood cell count, unspecified: Secondary | ICD-10-CM | POA: Diagnosis not present

## 2017-12-15 DIAGNOSIS — Z86711 Personal history of pulmonary embolism: Secondary | ICD-10-CM | POA: Insufficient documentation

## 2017-12-15 DIAGNOSIS — Z780 Asymptomatic menopausal state: Secondary | ICD-10-CM

## 2017-12-15 DIAGNOSIS — Z7901 Long term (current) use of anticoagulants: Secondary | ICD-10-CM | POA: Diagnosis not present

## 2017-12-15 DIAGNOSIS — Z87891 Personal history of nicotine dependence: Secondary | ICD-10-CM | POA: Insufficient documentation

## 2017-12-15 DIAGNOSIS — I10 Essential (primary) hypertension: Secondary | ICD-10-CM | POA: Diagnosis not present

## 2017-12-15 DIAGNOSIS — C50319 Malignant neoplasm of lower-inner quadrant of unspecified female breast: Secondary | ICD-10-CM

## 2017-12-15 DIAGNOSIS — C50911 Malignant neoplasm of unspecified site of right female breast: Secondary | ICD-10-CM | POA: Insufficient documentation

## 2017-12-15 MED ORDER — LETROZOLE 2.5 MG PO TABS: 2.5000 mg | ORAL_TABLET | Freq: Every day | ORAL | 4 refills | Status: DC

## 2017-12-15 NOTE — Progress Notes (Signed)
Diagnosis Malignant neoplasm of lower-inner quadrant of female breast, unspecified estrogen receptor status, unspecified laterality ([place]) - Plan: MM 3D SCREEN BREAST BILATERAL, DG Bone Density  Post-menopausal - Plan: MM 3D SCREEN BREAST BILATERAL, DG Bone Density  Staging Cancer Staging No matching staging information was found for the patient.  Assessment and Plan:  1.  Stage IA right breast cancer, ER+.  Pt was diagnosed in 07/2010. Treated with right breast lumpectomy, followed by adjuvant radiation therapy. She started anti-estrogen therapy with anastrozole from 11/2010 through 2017. Breast cancer index (BCI) genomic testing revealed high-risk of recurrence, as well as high likelihood of benefit of extension of therapy beyond 5 years. Anastrozole was stopped in 2017 due to arthralgias; changed to Letrozole in 02/2016.   Pt had 3D screening mammogram done 06/11/2017 and was negative.  She is recommended for bilateral screening mammogram in 05/2018.    Pt remains on Letrozole daily with improved tolerance after stopping Anastrozole d/t joint aches/pains.  She should continue Femara through 11/2020 for total of 10 years given BCI results showing HIGH risk of recurrence and HIGH likelihood of benefit of extension of therapy beyond 5 years.  Rx for Letrozole # 90 with 4 refills sent to pharmacy.    Pt will be seen for follow-up 06/2018 with mammogram and bone density results.   2.  Leucocytosis. Labs done 06/11/2017 showed WBC 12.2 which is stable, HB 12.9 and plts 306,000.  Cr is 0.74.  WBC has been mildly elevated going back to 2017 when it was 14.3.  Suspect a normal varient.  Pt was on steroid cream.    Labs done 12/08/2017 reviewed and showed WBC 10.2 Hb 13.7 plts 285,000.  Chemistries WNL with K+ 4.1 Cr 0.74 and normal LFTs.  Labs WNL.   Will repeat labs in 11/2018.    3.  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  Pt had Hospital admission on 01/15/2015 through 01/18/2015 for PE, cardiac  strain noted on CT. She completed 6 months of XARELTO in July. Thrombophilia evaluation was negative. She presented to office hypoxemic and tachpneic, repeat CTA on 08/15/2015 still showed residual pulmonary emboli. Recommended continuation of Xarelto at that time.   Pt had CTA done 12/2016 that showed IMPRESSION: 1. No occlusive pulmonary embolus is identified. Thin, wispy, web-like filling defects are noted within lower lobe pulmonary arteries that likely reflect some residual nonocclusive thromboemboli. 2. Stable mildly enlarged mediastinal and bilateral hilar lymph nodes possibly reactive. 3. Upper lobe predominant centrilobular emphysema with bronchiectasis in the lower lobes. 4. Nonspecific free intraparenchymal right lower lobe perihilar airspace opacities may reflect a small focus of infection or inflammation. 5. Stable 3 and 4 mm left lower lobe pulmonary nodules dating back to January, 2017 comparisons, likely to represent benign findings given stability.  She was instructed at that time by NP Renato Battles to discontinue Xarelto.  She denies any respiratory symptoms and remains off Xarelto.  Low threshold for repeat imaging with CTA if symptomatic.    4.  HTN.  BP is 158/83.  Follow-up with PCP.    5.  Health maintenance.  Pt had bone density done 06/03/2016 that was normal.  She should continue Calcium and vitamin D as recommended.  She is set up for repeat imaging in 06/2018.  Follow-up with PCP and GI as directed.    25 minutes spent with more than 50% spent in counseling and coordination of care.    Interval History: Historical data obtained from note dated 06/15/2017.   67 yr  old  Y.o. female followed by NP Renato Battles for  Stage IA, ER+ right breast cancer, diagnosed in 07/2010. She underwent lumpectomy, adjuvant radiation therapy, and was anti-estrogen therapy with Anastrozole from 11/2010-2017; she stopped Anastrozole in 2017 due to arthralgias.  The Breast Cancer Index (BCI) genomic  testing was sent in 06/2015 revealing high-risk of recurrence, as well as high-likelihood of benefit of extension of anti-estrogen therapy beyond 5 years.  On Xarelto for history of PE.   Current Status:  Pt is seen today for follow-up.  She is here to go over labs.      Right breast cancer T1bN0M0 ER Positive 2012   07/18/2010 Initial Diagnosis    Right breast cancer T1bN0M0 ER Positive 2012    07/11/2015 Pathology Results    BCI- 7.3% risk of late recurrence (5-10 years) HIGH risk, HIGH liklihood of benefit from extended endocrine therapy, with 16.5% absolute benefit, 13.5% risk of overall recurrence (0-10 years) intermediate risk, with high likelihood of benefit.      Problem List Patient Active Problem List   Diagnosis Date Noted  . Chronic joint pain [M25.50, G89.29] 10/16/2015  . Pulmonary embolism ([place]) [I26.99] 01/15/2015  . Chest pain on breathing [R07.1] 01/15/2015  . Dyspnea on exertion [R06.09] 01/15/2015  . Troponin level elevated [R79.89] 01/15/2015  . Arthritis [M19.90]   . Hypertension [I10]   . Essential hypertension [I10]   . PE (pulmonary thromboembolism) ([place]) [I26.99]   . Obstructive sleep apnea syndrome in adult [G47.33] 07/20/2013  . Weakness of right leg [R29.898] 03/11/2011  . Left leg weakness [R29.898] 03/11/2011  . Osteoarthritis of hip [M16.9] 03/08/2011  . DDD (degenerative disc disease), lumbar [M51.36] 03/08/2011  . Right breast cancer T1bN0M0 ER Positive 2012 [C50.319] 07/18/2010    Past Medical History Past Medical History:  Diagnosis Date  . Arthritis    back/saw Dr. Aline Brochure  . Breast cancer ([place])   . Cancer (HCC)    breast  . Diabetes mellitus   . Hypertension   . Low back pain   . Obesity   . Sleep apnea 04/2013    Past Surgical History Past Surgical History:  Procedure Laterality Date  . BIOPSY BREAST    . TUBAL LIGATION      Family History Family History  Problem Relation Age of Onset  . Liver cancer Mother   . Cancer  Mother   . Diabetes Mother   . Breast cancer Sister   . Cancer Sister      Social History  reports that she quit smoking about 23 years ago. Her smoking use included cigarettes. She has a 25.50 pack-year smoking history. She has never used smokeless tobacco. She reports that she does not drink alcohol or use drugs.  Medications  Current Outpatient Medications:  .  ACCU-CHEK GUIDE test strip, , Disp: , Rfl:  .  amoxicillin (AMOXIL) 500 MG capsule, TAKE 1 CAPSULE BY MOUTH FOUR TIMES A DAY, Disp: , Rfl: 0 .  atorvastatin (LIPITOR) 40 MG tablet, , Disp: , Rfl:  .  calcium-vitamin D (OSCAL WITH D) 500-200 MG-UNIT per tablet, Take 1 tablet by mouth 2 (two) times daily., Disp: , Rfl:  .  glipiZIDE (GLUCOTROL) 10 MG tablet, Take 10 mg by mouth 2 (two) times daily before a meal. , Disp: , Rfl:  .  HYDROcodone-acetaminophen (NORCO/VICODIN) 5-325 MG tablet, TAKE 1 TABLET EVERY 6-8 HOURS AS NEEDED, Disp: , Rfl: 0 .  ibuprofen (ADVIL,MOTRIN) 800 MG tablet, Take 800 mg by mouth every 8 (  eight) hours as needed., Disp: , Rfl: 0 .  letrozole (FEMARA) 2.5 MG tablet, Take 1 tablet (2.5 mg total) by mouth daily., Disp: 90 tablet, Rfl: 4 .  metFORMIN (GLUCOPHAGE) 1000 MG tablet, Take 1,000 mg by mouth 2 (two) times daily., Disp: , Rfl: 1 .  metoprolol succinate (TOPROL-XL) 25 MG 24 hr tablet, Take 25 mg by mouth daily., Disp: , Rfl: 1 .  Omega-3 Fatty Acids (FISH OIL) 1000 MG CPDR, Take 1,000 mg by mouth daily. , Disp: , Rfl:  .  omeprazole (PRILOSEC) 20 MG capsule, Take 20 mg by mouth every other day., Disp: , Rfl:   Allergies Lisinopril  Review of Systems Review of Systems - Oncology ROS negative   Physical Exam  Vitals Wt Readings from Last 3 Encounters:  12/15/17 227 lb (103 kg)  06/15/17 224 lb 6.4 oz (101.8 kg)  12/12/16 220 lb (99.8 kg)   Temp Readings from Last 3 Encounters:  12/15/17 98.8 F (37.1 C) (Oral)  06/15/17 98.2 F (36.8 C) (Oral)  12/12/16 98.5 F (36.9 C) (Oral)   BP  Readings from Last 3 Encounters:  12/15/17 (!) 158/83  06/15/17 139/75  12/12/16 (!) 158/81   Pulse Readings from Last 3 Encounters:  12/15/17 94  06/15/17 (!) 118  12/12/16 (!) 118   Constitutional: Well-developed, well-nourished, and in no distress.   HENT: Head: Normocephalic and atraumatic.  Mouth/Throat: No oropharyngeal exudate. Mucosa moist. Eyes: Pupils are equal, round, and reactive to light. Conjunctivae are normal. No scleral icterus.  Neck: Normal range of motion. Neck supple. No JVD present.  Cardiovascular: Normal rate, regular rhythm and normal heart sounds.  Exam reveals no gallop and no friction rub.   No murmur heard. Pulmonary/Chest: Effort normal and breath sounds normal. No respiratory distress. No wheezes.No rales.  Abdominal: Soft. Bowel sounds are normal. No distension. There is no tenderness. There is no guarding.  Musculoskeletal: No edema or tenderness.  Lymphadenopathy: No cervical, axillary or supraclavicular adenopathy.  Neurological: Alert and oriented to person, place, and time. No cranial nerve deficit.  Skin: Skin is warm and dry. No rash noted. No erythema. No pallor.  Psychiatric: Affect and judgment normal.  Breast exam.  Chaperone present.  Lumpectomy and RT changes noted in right breast.  No dominant masses palpable bilaterally.    Labs No visits with results within 3 Day(s) from this visit.  Latest known visit with results is:  Appointment on 12/08/2017  Component Date Value Ref Range Status  . WBC 12/08/2017 10.2  4.0 - 10.5 K/uL Final  . RBC 12/08/2017 4.87  3.87 - 5.11 MIL/uL Final  . Hemoglobin 12/08/2017 13.7  12.0 - 15.0 g/dL Final  . HCT 12/08/2017 43.5  36.0 - 46.0 % Final  . MCV 12/08/2017 89.3  80.0 - 100.0 fL Final  . MCH 12/08/2017 28.1  26.0 - 34.0 pg Final  . MCHC 12/08/2017 31.5  30.0 - 36.0 g/dL Final  . RDW 12/08/2017 14.6  11.5 - 15.5 % Final  . Platelets 12/08/2017 285  150 - 400 K/uL Final  . nRBC 12/08/2017 0.0  0.0  - 0.2 % Final  . Neutrophils Relative % 12/08/2017 71  % Final  . Neutro Abs 12/08/2017 7.3  1.7 - 7.7 K/uL Final  . Lymphocytes Relative 12/08/2017 21  % Final  . Lymphs Abs 12/08/2017 2.1  0.7 - 4.0 K/uL Final  . Monocytes Relative 12/08/2017 6  % Final  . Monocytes Absolute 12/08/2017 0.6  0.1 - 1.0  K/uL Final  . Eosinophils Relative 12/08/2017 2  % Final  . Eosinophils Absolute 12/08/2017 0.2  0.0 - 0.5 K/uL Final  . Basophils Relative 12/08/2017 0  % Final  . Basophils Absolute 12/08/2017 0.0  0.0 - 0.1 K/uL Final  . Immature Granulocytes 12/08/2017 0  % Final  . Abs Immature Granulocytes 12/08/2017 0.04  0.00 - 0.07 K/uL Final   Performed at [facility], [address] [place], [place] [address]  . Sodium 12/08/2017 142  135 - 145 mmol/L Final  . Potassium 12/08/2017 4.1  3.5 - 5.1 mmol/L Final  . Chloride 12/08/2017 108  98 - 111 mmol/L Final  . CO2 12/08/2017 25  22 - 32 mmol/L Final  . Glucose, Bld 12/08/2017 176* 70 - 99 mg/dL Final  . BUN 12/08/2017 10  8 - 23 mg/dL Final  . Creatinine, Ser 12/08/2017 0.74  0.44 - 1.00 mg/dL Final  . Calcium 12/08/2017 9.9  8.9 - 10.3 mg/dL Final  . Total Protein 12/08/2017 8.1  6.5 - 8.1 g/dL Final  . Albumin 12/08/2017 3.9  3.5 - 5.0 g/dL Final  . AST 12/08/2017 24  15 - 41 U/L Final  . ALT 12/08/2017 23  0 - 44 U/L Final  . Alkaline Phosphatase 12/08/2017 101  38 - 126 U/L Final  . Total Bilirubin 12/08/2017 0.7  0.3 - 1.2 mg/dL Final  . GFR calc non Af Amer 12/08/2017 >60  >60 mL/min Final  . GFR calc Af Amer 12/08/2017 >60  >60 mL/min Final  . Anion gap 12/08/2017 9  5 - 15 Final   Performed at [facility], [address], [place], [place] [address]  . LDH 12/08/2017 145  98 - 192 U/L Final   Performed at [facility], [address], [place], [place] [address]     Pathology Orders Placed This Encounter  Procedures  . MM 3D SCREEN BREAST BILATERAL    Standing Status:   Future    Standing Expiration Date:   02/16/2019    Order  Specific Question:   Reason for Exam (SYMPTOM  OR DIAGNOSIS REQUIRED)    Answer:   right breast cancer    Order Specific Question:   Preferred imaging location?    Answer:   [place]    Standing Status:   Future    Standing Expiration Date:   12/15/2019    Order Specific Question:   Reason for Exam (SYMPTOM  OR DIAGNOSIS REQUIRED)    Answer:   postmenopausal    Order Specific Question:   Preferred imaging location?    Answer:   [facility]       Zoila Shutter MD

## 2017-12-15 NOTE — Patient Instructions (Signed)
[facility] at [facility]  Discharge Instructions: You saw Dr. Higgs today                               _______________________________________________________________  Thank you for choosing [facility] at [facility] to provide your oncology and hematology care.  To afford each patient quality time with our providers, please arrive at least 15 minutes before your scheduled appointment.  You need to re-schedule your appointment if you arrive 10 or more minutes late.  We strive to give you quality time with our providers, and arriving late affects you and other patients whose appointments are after yours.  Also, if you no show three or more times for appointments you may be dismissed from the clinic.  Again, thank you for choosing [facility] at [facility]. Our hope is that these requests will allow you access to exceptional care and in a timely manner. _______________________________________________________________  If you have questions after your visit, please contact our office at [phone] between the hours of 8:30 a.m. and 5:00 p.m. Voicemails left after 4:30 p.m. will not be returned until the following business day. _______________________________________________________________  For prescription refill requests, have your pharmacy contact our office. _______________________________________________________________  Recommendations made by the consultant and any test results will be sent to your referring physician. _______________________________________________________________ 

## 2018-06-14 ENCOUNTER — Other Ambulatory Visit (HOSPITAL_COMMUNITY): Payer: Medicare PPO

## 2018-06-15 ENCOUNTER — Other Ambulatory Visit (HOSPITAL_COMMUNITY): Payer: Self-pay | Admitting: *Deleted

## 2018-06-15 ENCOUNTER — Other Ambulatory Visit (HOSPITAL_COMMUNITY): Payer: Self-pay | Admitting: Emergency Medicine

## 2018-06-15 DIAGNOSIS — C50319 Malignant neoplasm of lower-inner quadrant of unspecified female breast: Secondary | ICD-10-CM

## 2018-06-15 MED ORDER — LETROZOLE 2.5 MG PO TABS: 2.5000 mg | ORAL_TABLET | Freq: Every day | ORAL | 3 refills | Status: DC

## 2018-06-16 ENCOUNTER — Inpatient Hospital Stay (HOSPITAL_COMMUNITY): Payer: Medicare PPO | Attending: Hematology

## 2018-06-16 ENCOUNTER — Ambulatory Visit (HOSPITAL_COMMUNITY)
Admission: RE | Admit: 2018-06-16 | Discharge: 2018-06-16 | Disposition: A | Discharge status: Home / Self Care | Payer: Medicare PPO | Source: Ambulatory Visit | Attending: Internal Medicine | Admitting: Internal Medicine

## 2018-06-16 ENCOUNTER — Other Ambulatory Visit: Payer: Self-pay

## 2018-06-16 ENCOUNTER — Ambulatory Visit (HOSPITAL_COMMUNITY): Payer: Medicare PPO | Admitting: Hematology

## 2018-06-16 DIAGNOSIS — M7989 Other specified soft tissue disorders: Secondary | ICD-10-CM | POA: Diagnosis not present

## 2018-06-16 DIAGNOSIS — Z1231 Encounter for screening mammogram for malignant neoplasm of breast: Secondary | ICD-10-CM | POA: Insufficient documentation

## 2018-06-16 DIAGNOSIS — C50911 Malignant neoplasm of unspecified site of right female breast: Secondary | ICD-10-CM | POA: Diagnosis not present

## 2018-06-16 DIAGNOSIS — Z780 Asymptomatic menopausal state: Secondary | ICD-10-CM | POA: Insufficient documentation

## 2018-06-16 DIAGNOSIS — R921 Mammographic calcification found on diagnostic imaging of breast: Secondary | ICD-10-CM | POA: Insufficient documentation

## 2018-06-16 DIAGNOSIS — Z87891 Personal history of nicotine dependence: Secondary | ICD-10-CM | POA: Diagnosis not present

## 2018-06-16 DIAGNOSIS — M545 Low back pain: Secondary | ICD-10-CM | POA: Insufficient documentation

## 2018-06-16 DIAGNOSIS — G8929 Other chronic pain: Secondary | ICD-10-CM | POA: Insufficient documentation

## 2018-06-16 DIAGNOSIS — C50319 Malignant neoplasm of lower-inner quadrant of unspecified female breast: Secondary | ICD-10-CM

## 2018-06-16 DIAGNOSIS — R197 Diarrhea, unspecified: Secondary | ICD-10-CM | POA: Diagnosis not present

## 2018-06-16 LAB — COMPREHENSIVE METABOLIC PANEL
ALT: 24 U/L (ref 0–44)
AST: 19 U/L (ref 15–41)
Albumin: 4.1 g/dL (ref 3.5–5.0)
Alkaline Phosphatase: 103 U/L (ref 38–126)
Anion gap: 10 (ref 5–15)
BUN: 18 mg/dL (ref 8–23)
CO2: 22 mmol/L (ref 22–32)
Calcium: 10.2 mg/dL (ref 8.9–10.3)
Chloride: 108 mmol/L (ref 98–111)
Creatinine, Ser: 0.74 mg/dL (ref 0.44–1.00)
GFR calc Af Amer: >60 mL/min (ref >60)
GFR calc non Af Amer: >60 mL/min (ref >60)
Glucose, Bld: 107 mg/dL — ABNORMAL HIGH (ref 70–99)
Potassium: 4.1 mmol/L (ref 3.5–5.1)
Sodium: 140 mmol/L (ref 135–145)
Total Bilirubin: 0.5 mg/dL (ref 0.3–1.2)
Total Protein: 8.2 g/dL — ABNORMAL HIGH (ref 6.5–8.1)

## 2018-06-16 LAB — CBC WITH DIFFERENTIAL/PLATELET
Abs Immature Granulocytes: 0.06 10*3/uL (ref 0.00–0.07)
Basophils Absolute: 0.1 10*3/uL (ref 0.0–0.1)
Basophils Relative: 1 %
Eosinophils Absolute: 0.2 10*3/uL (ref 0.0–0.5)
Eosinophils Relative: 2 %
HCT: 42.7 % (ref 36.0–46.0)
Hemoglobin: 13.6 g/dL (ref 12.0–15.0)
Immature Granulocytes: 1 %
Lymphocytes Relative: 26 %
Lymphs Abs: 2.7 10*3/uL (ref 0.7–4.0)
MCH: 28.9 pg (ref 26.0–34.0)
MCHC: 31.9 g/dL (ref 30.0–36.0)
MCV: 90.9 fL (ref 80.0–100.0)
Monocytes Absolute: 0.8 10*3/uL (ref 0.1–1.0)
Monocytes Relative: 8 %
Neutro Abs: 6.5 10*3/uL (ref 1.7–7.7)
Neutrophils Relative %: 62 %
Platelets: 295 10*3/uL (ref 150–400)
RBC: 4.7 MIL/uL (ref 3.87–5.11)
RDW: 14.6 % (ref 11.5–15.5)
WBC: 10.3 10*3/uL (ref 4.0–10.5)
nRBC: 0 % (ref 0.0–0.2)

## 2018-06-16 LAB — LACTATE DEHYDROGENASE: LDH: 159 U/L (ref 98–192)

## 2018-06-16 NOTE — Progress Notes (Signed)
For review.  Please update ordering provider

## 2018-06-17 ENCOUNTER — Other Ambulatory Visit (HOSPITAL_COMMUNITY): Payer: Self-pay | Admitting: Internal Medicine

## 2018-06-17 DIAGNOSIS — R928 Other abnormal and inconclusive findings on diagnostic imaging of breast: Secondary | ICD-10-CM

## 2018-06-17 NOTE — Progress Notes (Signed)
For review.  Please update ordering provider

## 2018-06-18 ENCOUNTER — Ambulatory Visit (HOSPITAL_COMMUNITY): Payer: Medicare PPO | Admitting: Hematology

## 2018-06-21 ENCOUNTER — Other Ambulatory Visit (HOSPITAL_COMMUNITY): Payer: Self-pay | Admitting: Hematology

## 2018-06-21 DIAGNOSIS — R928 Other abnormal and inconclusive findings on diagnostic imaging of breast: Secondary | ICD-10-CM

## 2018-06-22 ENCOUNTER — Ambulatory Visit (HOSPITAL_COMMUNITY): Payer: Medicare PPO

## 2018-06-22 ENCOUNTER — Other Ambulatory Visit: Payer: Self-pay

## 2018-06-22 ENCOUNTER — Ambulatory Visit (HOSPITAL_COMMUNITY)
Admission: RE | Admit: 2018-06-22 | Discharge: 2018-06-22 | Disposition: A | Discharge status: Home / Self Care | Payer: Medicare PPO | Source: Ambulatory Visit | Attending: Internal Medicine | Admitting: Internal Medicine

## 2018-06-22 ENCOUNTER — Ambulatory Visit (HOSPITAL_COMMUNITY): Admission: RE | Admit: 2018-06-22 | Payer: Medicare PPO | Source: Ambulatory Visit

## 2018-06-22 DIAGNOSIS — R928 Other abnormal and inconclusive findings on diagnostic imaging of breast: Secondary | ICD-10-CM | POA: Diagnosis not present

## 2018-06-24 ENCOUNTER — Encounter (HOSPITAL_COMMUNITY): Payer: Self-pay | Admitting: Hematology

## 2018-06-24 ENCOUNTER — Other Ambulatory Visit: Payer: Self-pay

## 2018-06-24 ENCOUNTER — Inpatient Hospital Stay (HOSPITAL_BASED_OUTPATIENT_CLINIC_OR_DEPARTMENT_OTHER): Payer: Medicare PPO | Admitting: Hematology

## 2018-06-24 ENCOUNTER — Other Ambulatory Visit: Payer: Self-pay | Admitting: Hematology

## 2018-06-24 VITALS — BP 138/72 | HR 113 | Temp 98.4°F | Resp 18 | Wt 222.7 lb

## 2018-06-24 DIAGNOSIS — N6489 Other specified disorders of breast: Secondary | ICD-10-CM

## 2018-06-24 DIAGNOSIS — M7989 Other specified soft tissue disorders: Secondary | ICD-10-CM

## 2018-06-24 DIAGNOSIS — C50911 Malignant neoplasm of unspecified site of right female breast: Secondary | ICD-10-CM | POA: Diagnosis not present

## 2018-06-24 DIAGNOSIS — Z87891 Personal history of nicotine dependence: Secondary | ICD-10-CM

## 2018-06-24 DIAGNOSIS — R921 Mammographic calcification found on diagnostic imaging of breast: Secondary | ICD-10-CM

## 2018-06-24 DIAGNOSIS — M545 Low back pain: Secondary | ICD-10-CM | POA: Diagnosis not present

## 2018-06-24 DIAGNOSIS — R197 Diarrhea, unspecified: Secondary | ICD-10-CM

## 2018-06-24 DIAGNOSIS — G8929 Other chronic pain: Secondary | ICD-10-CM

## 2018-06-24 DIAGNOSIS — C50319 Malignant neoplasm of lower-inner quadrant of unspecified female breast: Secondary | ICD-10-CM

## 2018-06-24 NOTE — Assessment & Plan Note (Signed)
1.  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 -She underwent radiation therapy followed by anastrozole from #2012 through end of 2017. -Due to arthralgias, it was changed to letrozole in February 2018. - Mammogram on 06/22/2018 shows right breast group of coarse heterogeneous calcifications in the central medial port.  This spans 1.3 x 8 x 5 mm.  Stereotactic biopsy of the right breast was recommended. -We reviewed her blood work which is within normal limits. - We will schedule her for stereotactic biopsy of right breast calcifications. -I will see her back after the biopsy to discuss results.  2.  Pulmonary embolism: -From history this appears unprovoked. - Last CT on 12/25/2016 did not show any pulmonary embolus.  She was treated with Xarelto previously.  3.  Leukocytosis: - She had transient leukocytosis in 2018 in 2019. - Current CBC shows white count normal at 10.2 with normal differential.  4.  Bone mineral density: - DEXA scan on 06/16/2018 shows T score of 0.1 which was in the normal range. -She was counseled to start back on calcium and vitamin D supplements.

## 2018-06-24 NOTE — Patient Instructions (Addendum)
[place] at [facility] Discharge Instructions  You were seen today by Dr. Delton Coombes. He went over your recent lab results. He will see you back after your biopsy for follow up.   Thank you for choosing [place] at [facility] to provide your oncology and hematology care.  To afford each patient quality time with our provider, please arrive at least 15 minutes before your scheduled appointment time.   If you have a lab appointment with the [facility] please come in thru the  Main Entrance and check in at the main information desk  You need to re-schedule your appointment should you arrive 10 or more minutes late.  We strive to give you quality time with our providers, and arriving late affects you and other patients whose appointments are after yours.  Also, if you no show three or more times for appointments you may be dismissed from the clinic at the providers discretion.     Again, thank you for choosing [facility].  Our hope is that these requests will decrease the amount of time that you wait before being seen by our physicians.       _____________________________________________________________  Should you have questions after your visit to [facility], please contact our office at [phone] between the hours of 8:00 a.m. and 4:30 p.m.  Voicemails left after 4:00 p.m. will not be returned until the following business day.  For prescription refill requests, have your pharmacy contact our office and allow 72 hours.    Cancer Center Support Programs:   > Cancer Support Group  2nd Tuesday of the month 1pm-2pm, Journey Room

## 2018-06-24 NOTE — Progress Notes (Signed)
[place], [place] [address]   CLINIC:  Medical Oncology/Hematology  PCP:  Abran Richard, MD [address] [place] [address] [phone]   REASON FOR VISIT:  Follow-up for right breast cancer.   BRIEF ONCOLOGIC HISTORY:  Oncology History  Right breast cancer T1bN0M0 ER Positive 2012  07/18/2010 Initial Diagnosis   Right breast cancer T1bN0M0 ER Positive 2012   07/11/2015 Pathology Results   BCI- 7.3% risk of late recurrence (5-10 years) HIGH risk, HIGH liklihood of benefit from extended endocrine therapy, with 16.5% absolute benefit, 13.5% risk of overall recurrence (0-10 years) intermediate risk, with high likelihood of benefit.      CANCER STAGING: Cancer Staging No matching staging information was found for the patient.   INTERVAL HISTORY:  Alexa Castillo 69 y.o. female returns for follow-up of right breast infiltrating ductal carcinoma.  She is currently taking letrozole.  She has some leg swellings at times and diarrhea at times.  Appetite is 100% and energy levels are 50%.  Chronic low back pain is rated as 6.  This is stable.  She thinks that her joint pains particularly in the back and knees are slightly worse since the start of remedies and better therapy.  She ran out of calcium and vitamin D pills few days ago.  She was taking them before that.  She was stopped Xarelto last year.    REVIEW OF SYSTEMS:  Review of Systems  Cardiovascular: Positive for leg swelling.  Gastrointestinal: Positive for diarrhea.  All other systems reviewed and are negative.    PAST MEDICAL/SURGICAL HISTORY:  Past Medical History:  Diagnosis Date  . Arthritis    back/saw Dr. Aline Brochure  . Breast cancer ([place])   . Cancer (HCC)    breast  . Diabetes mellitus   . Hypertension   . Low back pain   . Obesity   . Sleep apnea 04/2013   Past Surgical History:  Procedure Laterality Date  . BIOPSY BREAST    . TUBAL LIGATION       SOCIAL  HISTORY:  Social History   Socioeconomic History  . Marital status: Single    Spouse name: Not on file  . Number of children: Not on file  . Years of education: Not on file  . Highest education level: Not on file  Occupational History  . Not on file  Social Needs  . Financial resource strain: Not on file  . Food insecurity    Worry: Not on file    Inability: Not on file  . Transportation needs    Medical: Not on file    Non-medical: Not on file  Tobacco Use  . Smoking status: Former Smoker    Packs/day: 1.50    Years: 17.00    Pack years: 25.50    Types: Cigarettes    Quit date: 12/18/1994    Years since quitting: 23.5  . Smokeless tobacco: Never Used  Substance and Sexual Activity  . Alcohol use: No  . Drug use: No  . Sexual activity: Not on file  Lifestyle  . Physical activity    Days per week: Not on file    Minutes per session: Not on file  . Stress: Not on file  Relationships  . Social Herbalist on phone: Not on file    Gets together: Not on file    Attends religious service: Not on file    Active member of club  or organization: Not on file    Attends meetings of clubs or organizations: Not on file    Relationship status: Not on file  . Intimate partner violence    Fear of current or ex partner: Not on file    Emotionally abused: Not on file    Physically abused: Not on file    Forced sexual activity: Not on file  Other Topics Concern  . Not on file  Social History Narrative  . Not on file    FAMILY HISTORY:  Family History  Problem Relation Age of Onset  . Liver cancer Mother   . Cancer Mother   . Diabetes Mother   . Breast cancer Sister   . Cancer Sister     CURRENT MEDICATIONS:  Outpatient Encounter Medications as of 06/24/2018  Medication Sig Note  . Accu-Chek FastClix Lancets MISC    . ACCU-CHEK GUIDE test strip    . atorvastatin (LIPITOR) 40 MG tablet    . calcium-vitamin D (OSCAL WITH D) 500-200 MG-UNIT per tablet Take 1  tablet by mouth 2 (two) times daily.   Marland Kitchen glipiZIDE (GLUCOTROL) 10 MG tablet Take 10 mg by mouth 2 (two) times daily before a meal.    . HYDROcodone-acetaminophen (NORCO/VICODIN) 5-325 MG tablet TAKE 1 TABLET EVERY 6-8 HOURS AS NEEDED   . ibuprofen (ADVIL,MOTRIN) 800 MG tablet Take 800 mg by mouth every 8 (eight) hours as needed.   Marland Kitchen letrozole (FEMARA) 2.5 MG tablet Take 1 tablet (2.5 mg total) by mouth daily.   . metFORMIN (GLUCOPHAGE) 1000 MG tablet Take 1,000 mg by mouth 2 (two) times daily. 07/27/2014: Received from: External Pharmacy  . metoprolol succinate (TOPROL-XL) 25 MG 24 hr tablet Take 25 mg by mouth daily. 07/27/2014: Received from: External Pharmacy  . Omega-3 Fatty Acids (FISH OIL) 1000 MG CPDR Take 1,000 mg by mouth daily.    Marland Kitchen omeprazole (PRILOSEC) 20 MG capsule Take 20 mg by mouth every other day. 07/20/2013: Received from: External Pharmacy Received Sig:   . [DISCONTINUED] amoxicillin (AMOXIL) 500 MG capsule TAKE 1 CAPSULE BY MOUTH FOUR TIMES A DAY    No facility-administered encounter medications on file as of 06/24/2018.     ALLERGIES:  Allergies  Allergen Reactions  . Lisinopril      PHYSICAL EXAM:  ECOG Performance status: 1  Vitals:   06/24/18 1439  BP: 138/72  Pulse: (!) 113  Resp: 18  Temp: 98.4 F (36.9 C)  SpO2: 91%   Filed Weights   06/24/18 1439  Weight: 222 lb 11.2 oz (101 kg)    Physical Exam Vitals signs reviewed.  Constitutional:      Appearance: Normal appearance.  Cardiovascular:     Rate and Rhythm: Normal rate and regular rhythm.     Heart sounds: Normal heart sounds.  Pulmonary:     Effort: Pulmonary effort is normal.     Breath sounds: Normal breath sounds.  Abdominal:     General: There is no distension.     Palpations: Abdomen is soft. There is no mass.  Skin:    General: Skin is warm.  Neurological:     General: No focal deficit present.     Mental Status: She is alert and oriented to person, place, and time.  Psychiatric:         Mood and Affect: Mood normal.        Behavior: Behavior normal.   Breast exam did not reveal any palpable masses.  No palpable adenopathy.  LABORATORY DATA:  I have reviewed the labs as listed.  CBC    Component Value Date/Time   WBC 10.3 06/16/2018 1310   RBC 4.70 06/16/2018 1310   HGB 13.6 06/16/2018 1310   HCT 42.7 06/16/2018 1310   PLT 295 06/16/2018 1310   MCV 90.9 06/16/2018 1310   MCH 28.9 06/16/2018 1310   MCHC 31.9 06/16/2018 1310   RDW 14.6 06/16/2018 1310   LYMPHSABS 2.7 06/16/2018 1310   MONOABS 0.8 06/16/2018 1310   EOSABS 0.2 06/16/2018 1310   BASOSABS 0.1 06/16/2018 1310   CMP Latest Ref Rng & Units 06/16/2018 12/08/2017 06/11/2017  Glucose 70 - 99 mg/dL 107(H) 176(H) 82  BUN 8 - 23 mg/dL _0 Creatinine 0.44 - 1.00 mg/dL 0.74 0.74 0.74  Sodium 135 - 145 mmol/L 140 142 139  Potassium 3.5 - 5.1 mmol/L 4.1 4.1 4.4  Chloride 98 - 111 mmol/L 108 108 105  CO2 22 - 32 mmol/L _1 Calcium 8.9 - 10.3 mg/dL 10.2 9.9 10.1  Total Protein 6.5 - 8.1 g/dL 8.2(H) 8.1 7.9  Total Bilirubin 0.3 - 1.2 mg/dL 0.5 0.7 0.5  Alkaline Phos 38 - 126 U/L 103 101 88  AST 15 - 41 U/L _2 ALT 0 - 44 U/L _3 DIAGNOSTIC IMAGING:  I have independently reviewed the scans and discussed with the patient.   I have reviewed Venita Lick LPN's note and agree with the documentation.  I personally performed a face-to-face visit, made revisions and my assessment and plan is as follows.    ASSESSMENT & PLAN:   Right breast cancer T1bN0M0 ER Positive 2012 1.  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 -She underwent radiation therapy followed by anastrozole from #2012 through end of 2017. -Due to arthralgias, it was changed to letrozole in February 2018. - Mammogram on 06/22/2018 shows right breast group of coarse  heterogeneous calcifications in the central medial port.  This spans 1.3 x 8 x 5 mm.  Stereotactic biopsy of the right breast was recommended. -We reviewed her blood work which is within normal limits. - We will schedule her for stereotactic biopsy of right breast calcifications. -I will see her back after the biopsy to discuss results.  2.  Pulmonary embolism: -From history this appears unprovoked. - Last CT on 12/25/2016 did not show any pulmonary embolus.  She was treated with Xarelto previously.  3.  Leukocytosis: - She had transient leukocytosis in 2018 in 2019. - Current CBC shows white count normal at 10.2 with normal differential.  4.  Bone mineral density: - DEXA scan on 06/16/2018 shows T score of 0.1 which was in the normal range. -She was counseled to start back on calcium and vitamin D supplements.   Time spent is 25 minutes more than 50% of the time spent face-to-face discussing scan results, further work-up and coordination of care.  Orders placed this encounter:  Orders Placed This Encounter  Procedures  . CT Guided Stereotactic Localization  . CBC with Differential/Platelet  . Comprehensive metabolic panel      Derek Jack, MD [place] [phone]

## 2018-06-28 ENCOUNTER — Other Ambulatory Visit: Payer: Self-pay

## 2018-06-28 ENCOUNTER — Ambulatory Visit
Admission: RE | Admit: 2018-06-28 | Discharge: 2018-06-28 | Disposition: A | Discharge status: Home / Self Care | Payer: Medicare PPO | Source: Ambulatory Visit | Attending: Hematology | Admitting: Hematology

## 2018-06-28 ENCOUNTER — Other Ambulatory Visit (HOSPITAL_COMMUNITY): Payer: Self-pay | Admitting: *Deleted

## 2018-06-28 DIAGNOSIS — N6489 Other specified disorders of breast: Secondary | ICD-10-CM

## 2018-06-28 NOTE — Progress Notes (Signed)
We received notification of abnormal mammogram with recommendation for stereotactic biopsy of right breast.  Per Dr. Delton Coombes okay to proceed.  Patient is scheduled today for this biopsy.

## 2018-07-06 ENCOUNTER — Encounter (HOSPITAL_COMMUNITY): Payer: Self-pay | Admitting: Hematology

## 2018-07-06 ENCOUNTER — Inpatient Hospital Stay (HOSPITAL_BASED_OUTPATIENT_CLINIC_OR_DEPARTMENT_OTHER): Payer: Medicare PPO | Admitting: Hematology

## 2018-07-06 ENCOUNTER — Other Ambulatory Visit: Payer: Self-pay

## 2018-07-06 ENCOUNTER — Encounter: Payer: Self-pay | Admitting: *Deleted

## 2018-07-06 DIAGNOSIS — Z86711 Personal history of pulmonary embolism: Secondary | ICD-10-CM | POA: Diagnosis not present

## 2018-07-06 DIAGNOSIS — C50319 Malignant neoplasm of lower-inner quadrant of unspecified female breast: Secondary | ICD-10-CM

## 2018-07-06 DIAGNOSIS — Z87891 Personal history of nicotine dependence: Secondary | ICD-10-CM

## 2018-07-06 DIAGNOSIS — C50911 Malignant neoplasm of unspecified site of right female breast: Secondary | ICD-10-CM

## 2018-07-06 NOTE — Progress Notes (Signed)
[place], [place] [address]   CLINIC:  Medical Oncology/Hematology  PCP:  Alexa Richard, MD [address] [place]  [address] [phone]   REASON FOR VISIT:  Follow-up for Breast Cancer   CURRENT THERAPY: Clinical Surveillance   BRIEF ONCOLOGIC HISTORY:  Oncology History  Right breast cancer T1bN0M0 ER Positive 2012  07/18/2010 Initial Diagnosis   Right breast cancer T1bN0M0 ER Positive 2012   07/11/2015 Pathology Results   BCI- 7.3% risk of late recurrence (5-10 years) HIGH risk, HIGH liklihood of benefit from extended endocrine therapy, with 16.5% absolute benefit, 13.5% risk of overall recurrence (0-10 years) intermediate risk, with high likelihood of benefit.       INTERVAL HISTORY:  Alexa Castillo 68 y.o. female presents today for follow up. She reports overall doing well. Denies any significant fatigue. Currently on Letrozole, tolerating well. Denies any hot flashes or myalgias. She recently had a R breast biopsy and is here to discuss results.  Denies any new onset body pains.  Appetite and energy levels are 75%.  Pain is reported as 0.  Denies any bleeding per rectum or melena.  No change in bowel habits noted.   REVIEW OF SYSTEMS:  Review of Systems  All other systems reviewed and are negative.    PAST MEDICAL/SURGICAL HISTORY:  Past Medical History:  Diagnosis Date  . Arthritis    back/saw Dr. Aline Brochure  . Breast cancer ([place])   . Cancer (HCC)    breast  . Diabetes mellitus   . Hypertension   . Low back pain   . Obesity   . Sleep apnea 04/2013   Past Surgical History:  Procedure Laterality Date  . BIOPSY BREAST    . TUBAL LIGATION       SOCIAL HISTORY:  Social History   Socioeconomic History  . Marital status: Single    Spouse name: Not on file  . Number of children: Not on file  . Years of education: Not on file  . Highest education level: Not on file  Occupational History  . Not on file  Social  Needs  . Financial resource strain: Not on file  . Food insecurity    Worry: Not on file    Inability: Not on file  . Transportation needs    Medical: Not on file    Non-medical: Not on file  Tobacco Use  . Smoking status: Former Smoker    Packs/day: 1.50    Years: 17.00    Pack years: 25.50    Types: Cigarettes    Quit date: 12/18/1994    Years since quitting: 23.5  . Smokeless tobacco: Never Used  Substance and Sexual Activity  . Alcohol use: No  . Drug use: No  . Sexual activity: Not on file  Lifestyle  . Physical activity    Days per week: Not on file    Minutes per session: Not on file  . Stress: Not on file  Relationships  . Social Herbalist on phone: Not on file    Gets together: Not on file    Attends religious service: Not on file    Active member of club or organization: Not on file    Attends meetings of clubs or organizations: Not on file    Relationship status: Not on file  . Intimate partner violence    Fear of current or ex partner: Not on file    Emotionally abused: Not  on file    Physically abused: Not on file    Forced sexual activity: Not on file  Other Topics Concern  . Not on file  Social History Narrative  . Not on file    FAMILY HISTORY:  Family History  Problem Relation Age of Onset  . Liver cancer Mother   . Cancer Mother   . Diabetes Mother   . Breast cancer Sister   . Cancer Sister     CURRENT MEDICATIONS:  Outpatient Encounter Medications as of 07/06/2018  Medication Sig Note  . Accu-Chek FastClix Lancets MISC    . ACCU-CHEK GUIDE test strip    . Acetaminophen (TYLENOL 8 HOUR ARTHRITIS PAIN PO) Take 500 mg by mouth 4 (four) times daily.   Marland Kitchen atorvastatin (LIPITOR) 40 MG tablet    . calcium-vitamin D (OSCAL WITH D) 500-200 MG-UNIT per tablet Take 1 tablet by mouth 2 (two) times daily.   Marland Kitchen glipiZIDE (GLUCOTROL) 10 MG tablet Take 10 mg by mouth 2 (two) times daily before a meal.    . HYDROcodone-acetaminophen  (NORCO/VICODIN) 5-325 MG tablet TAKE 1 TABLET EVERY 6-8 HOURS AS NEEDED   . letrozole (FEMARA) 2.5 MG tablet Take 1 tablet (2.5 mg total) by mouth daily.   . metFORMIN (GLUCOPHAGE) 1000 MG tablet Take 1,000 mg by mouth 2 (two) times daily. 07/27/2014: Received from: External Pharmacy  . metoprolol succinate (TOPROL-XL) 25 MG 24 hr tablet Take 25 mg by mouth daily. 07/27/2014: Received from: External Pharmacy  . Omega-3 Fatty Acids (FISH OIL) 1000 MG CPDR Take 1,000 mg by mouth daily.    Marland Kitchen omeprazole (PRILOSEC) 20 MG capsule Take 20 mg by mouth every other day. 07/20/2013: Received from: External Pharmacy Received Sig:   . ibuprofen (ADVIL,MOTRIN) 800 MG tablet Take 800 mg by mouth every 8 (eight) hours as needed.    No facility-administered encounter medications on file as of 07/06/2018.     ALLERGIES:  Allergies  Allergen Reactions  . Lisinopril      PHYSICAL EXAM:  ECOG Performance status: 1  Vitals:   07/06/18 1021  BP: (!) 142/73  Pulse: (!) 104  Resp: 18  Temp: 98.6 F (37 C)  SpO2: 93%   Filed Weights   07/06/18 1021  Weight: 225 lb 4.8 oz (102.2 kg)    Physical Exam Vitals signs reviewed.  Constitutional:      Appearance: Normal appearance. She is obese.  HENT:     Head: Normocephalic.     Nose: Nose normal.     Mouth/Throat:     Mouth: Mucous membranes are moist.     Pharynx: Oropharynx is clear.  Eyes:     Extraocular Movements: Extraocular movements intact.     Conjunctiva/sclera: Conjunctivae normal.  Neck:     Musculoskeletal: Normal range of motion.  Cardiovascular:     Rate and Rhythm: Normal rate and regular rhythm.     Pulses: Normal pulses.     Heart sounds: Normal heart sounds.  Pulmonary:     Effort: Pulmonary effort is normal.     Breath sounds: Normal breath sounds.  Abdominal:     General: Bowel sounds are normal.     Palpations: Abdomen is soft.  Musculoskeletal: Normal range of motion.  Skin:    General: Skin is warm and dry.   Neurological:     General: No focal deficit present.     Mental Status: She is alert and oriented to person, place, and time.  Psychiatric:  Mood and Affect: Mood normal.        Behavior: Behavior normal.        Thought Content: Thought content normal.        Judgment: Judgment normal.      LABORATORY DATA:  I have reviewed the labs as listed.  CBC    Component Value Date/Time   WBC 10.3 06/16/2018 1310   RBC 4.70 06/16/2018 1310   HGB 13.6 06/16/2018 1310   HCT 42.7 06/16/2018 1310   PLT 295 06/16/2018 1310   MCV 90.9 06/16/2018 1310   MCH 28.9 06/16/2018 1310   MCHC 31.9 06/16/2018 1310   RDW 14.6 06/16/2018 1310   LYMPHSABS 2.7 06/16/2018 1310   MONOABS 0.8 06/16/2018 1310   EOSABS 0.2 06/16/2018 1310   BASOSABS 0.1 06/16/2018 1310   CMP Latest Ref Rng & Units 06/16/2018 12/08/2017 06/11/2017  Glucose 70 - 99 mg/dL 107(H) 176(H) 82  BUN 8 - 23 mg/dL '18 10 16  '$ Creatinine 0.44 - 1.00 mg/dL 0.74 0.74 0.74  Sodium 135 - 145 mmol/L 140 142 139  Potassium 3.5 - 5.1 mmol/L 4.1 4.1 4.4  Chloride 98 - 111 mmol/L 108 108 105  CO2 22 - 32 mmol/L '22 25 23  '$ Calcium 8.9 - 10.3 mg/dL 10.2 9.9 10.1  Total Protein 6.5 - 8.1 g/dL 8.2(H) 8.1 7.9  Total Bilirubin 0.3 - 1.2 mg/dL 0.5 0.7 0.5  Alkaline Phos 38 - 126 U/L 103 101 88  AST 15 - 41 U/L '19 24 26  '$ ALT 0 - 44 U/L '24 23 29       '$ Radiology: I have personally reviewed her recent scans and discussed with the patient.    ASSESSMENT & PLAN:   Right breast cancer T1bN0M0 ER Positive 2012 1.  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 -She underwent radiation therapy followed by anastrozole from #2012 through end of 2017. -Due to arthralgias, it was changed to letrozole in February 2018. - Mammogram on 06/22/2018 shows right breast group of coarse heterogeneous calcifications in the  central medial port.  This spans 1.3 x 8 x 5 mm.  Stereotactic biopsy of the right breast was recommended. -We reviewed her blood work which is within normal limits. - R breast biopsy: 06/28/2018: suggest fibroadenoma. We will continue with Letrozole daily and plan to repeat mammogram in 06/2019.  - RTC in 6 mths  2.  Pulmonary embolism: -From history this appears unprovoked. - Last CT on 12/25/2016 did not show any pulmonary embolus.  She was treated with Xarelto previously.  3.  Leukocytosis: - She had transient leukocytosis in 2018 in 2019.   4.  Bone mineral density: - DEXA scan on 06/16/2018 shows T score of 0.1 which was in the normal range. -She was counseled to start back on calcium and vitamin D supplements.   Total time spent is 25 minutes with more than 50% of the time spent face-to-face discussing biopsy results, counseling and coordination of care.  Orders placed this encounter:  No orders of the defined types were placed in this encounter.     Alexa Jack, MD [place] [phone]

## 2018-07-06 NOTE — Assessment & Plan Note (Signed)
1.  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 -She underwent radiation therapy followed by anastrozole from #2012 through end of 2017. -Due to arthralgias, it was changed to letrozole in February 2018. - Mammogram on 06/22/2018 shows right breast group of coarse heterogeneous calcifications in the central medial port.  This spans 1.3 x 8 x 5 mm.  Stereotactic biopsy of the right breast was recommended. -We reviewed her blood work which is within normal limits. - R breast biopsy: 06/28/2018: suggest fibroadenoma. We will continue with Letrozole daily and plan to repeat mammogram in 06/2019.  - RTC in 6 mths  2.  Pulmonary embolism: -From history this appears unprovoked. - Last CT on 12/25/2016 did not show any pulmonary embolus.  She was treated with Xarelto previously.  3.  Leukocytosis: - She had transient leukocytosis in 2018 in 2019.   4.  Bone mineral density: - DEXA scan on 06/16/2018 shows T score of 0.1 which was in the normal range. -She was counseled to start back on calcium and vitamin D supplements.

## 2018-08-11 ENCOUNTER — Other Ambulatory Visit: Payer: Self-pay

## 2018-08-11 ENCOUNTER — Other Ambulatory Visit: Payer: Medicare PPO

## 2018-08-11 DIAGNOSIS — Z20822 Contact with and (suspected) exposure to covid-19: Secondary | ICD-10-CM

## 2018-08-13 LAB — NOVEL CORONAVIRUS, NAA: SARS-CoV-2, NAA: DETECTED — AB

## 2018-08-30 ENCOUNTER — Emergency Department (HOSPITAL_COMMUNITY): Payer: Medicare PPO

## 2018-08-30 ENCOUNTER — Other Ambulatory Visit: Payer: Self-pay

## 2018-08-30 ENCOUNTER — Inpatient Hospital Stay (HOSPITAL_COMMUNITY)
Admission: EM | Admit: 2018-08-30 | Discharge: 2018-09-03 | DRG: 175 | Disposition: A | Discharge status: Home / Self Care | Payer: Medicare PPO | Attending: Internal Medicine | Admitting: Internal Medicine

## 2018-08-30 ENCOUNTER — Encounter (HOSPITAL_COMMUNITY): Payer: Self-pay | Admitting: Emergency Medicine

## 2018-08-30 DIAGNOSIS — E872 Acidosis: Secondary | ICD-10-CM | POA: Diagnosis present

## 2018-08-30 DIAGNOSIS — I340 Nonrheumatic mitral (valve) insufficiency: Secondary | ICD-10-CM | POA: Diagnosis not present

## 2018-08-30 DIAGNOSIS — Z7984 Long term (current) use of oral hypoglycemic drugs: Secondary | ICD-10-CM

## 2018-08-30 DIAGNOSIS — U071 COVID-19: Secondary | ICD-10-CM | POA: Diagnosis present

## 2018-08-30 DIAGNOSIS — I700 Atherosclerosis of aorta: Secondary | ICD-10-CM | POA: Diagnosis present

## 2018-08-30 DIAGNOSIS — Z803 Family history of malignant neoplasm of breast: Secondary | ICD-10-CM

## 2018-08-30 DIAGNOSIS — J439 Emphysema, unspecified: Secondary | ICD-10-CM | POA: Diagnosis not present

## 2018-08-30 DIAGNOSIS — Z888 Allergy status to other drugs, medicaments and biological substances status: Secondary | ICD-10-CM

## 2018-08-30 DIAGNOSIS — Z6841 Body Mass Index (BMI) 40.0 and over, adult: Secondary | ICD-10-CM

## 2018-08-30 DIAGNOSIS — E669 Obesity, unspecified: Secondary | ICD-10-CM | POA: Diagnosis present

## 2018-08-30 DIAGNOSIS — Z86711 Personal history of pulmonary embolism: Secondary | ICD-10-CM

## 2018-08-30 DIAGNOSIS — Z853 Personal history of malignant neoplasm of breast: Secondary | ICD-10-CM | POA: Diagnosis not present

## 2018-08-30 DIAGNOSIS — Z79811 Long term (current) use of aromatase inhibitors: Secondary | ICD-10-CM | POA: Diagnosis not present

## 2018-08-30 DIAGNOSIS — Z923 Personal history of irradiation: Secondary | ICD-10-CM

## 2018-08-30 DIAGNOSIS — R7989 Other specified abnormal findings of blood chemistry: Secondary | ICD-10-CM | POA: Diagnosis not present

## 2018-08-30 DIAGNOSIS — Z87891 Personal history of nicotine dependence: Secondary | ICD-10-CM | POA: Diagnosis not present

## 2018-08-30 DIAGNOSIS — R9431 Abnormal electrocardiogram [ECG] [EKG]: Secondary | ICD-10-CM | POA: Diagnosis present

## 2018-08-30 DIAGNOSIS — G8929 Other chronic pain: Secondary | ICD-10-CM | POA: Diagnosis not present

## 2018-08-30 DIAGNOSIS — M545 Low back pain: Secondary | ICD-10-CM | POA: Diagnosis present

## 2018-08-30 DIAGNOSIS — I2602 Saddle embolus of pulmonary artery with acute cor pulmonale: Secondary | ICD-10-CM | POA: Diagnosis not present

## 2018-08-30 DIAGNOSIS — Z833 Family history of diabetes mellitus: Secondary | ICD-10-CM | POA: Diagnosis not present

## 2018-08-30 DIAGNOSIS — C50319 Malignant neoplasm of lower-inner quadrant of unspecified female breast: Secondary | ICD-10-CM | POA: Diagnosis present

## 2018-08-30 DIAGNOSIS — I10 Essential (primary) hypertension: Secondary | ICD-10-CM | POA: Diagnosis present

## 2018-08-30 DIAGNOSIS — G4733 Obstructive sleep apnea (adult) (pediatric): Secondary | ICD-10-CM | POA: Diagnosis present

## 2018-08-30 DIAGNOSIS — Z800 Family history of malignant neoplasm of digestive organs: Secondary | ICD-10-CM

## 2018-08-30 DIAGNOSIS — I361 Nonrheumatic tricuspid (valve) insufficiency: Secondary | ICD-10-CM | POA: Diagnosis not present

## 2018-08-30 DIAGNOSIS — E875 Hyperkalemia: Secondary | ICD-10-CM | POA: Diagnosis present

## 2018-08-30 DIAGNOSIS — J9601 Acute respiratory failure with hypoxia: Secondary | ICD-10-CM | POA: Diagnosis present

## 2018-08-30 DIAGNOSIS — E119 Type 2 diabetes mellitus without complications: Secondary | ICD-10-CM | POA: Diagnosis present

## 2018-08-30 DIAGNOSIS — R778 Other specified abnormalities of plasma proteins: Secondary | ICD-10-CM | POA: Diagnosis present

## 2018-08-30 DIAGNOSIS — E878 Other disorders of electrolyte and fluid balance, not elsewhere classified: Secondary | ICD-10-CM | POA: Diagnosis not present

## 2018-08-30 DIAGNOSIS — Z79899 Other long term (current) drug therapy: Secondary | ICD-10-CM

## 2018-08-30 DIAGNOSIS — I2609 Other pulmonary embolism with acute cor pulmonale: Secondary | ICD-10-CM | POA: Diagnosis not present

## 2018-08-30 DIAGNOSIS — N179 Acute kidney failure, unspecified: Secondary | ICD-10-CM | POA: Diagnosis present

## 2018-08-30 DIAGNOSIS — I2699 Other pulmonary embolism without acute cor pulmonale: Secondary | ICD-10-CM | POA: Diagnosis present

## 2018-08-30 DIAGNOSIS — I959 Hypotension, unspecified: Secondary | ICD-10-CM | POA: Diagnosis present

## 2018-08-30 HISTORY — DX: Other pulmonary embolism without acute cor pulmonale: I26.99

## 2018-08-30 HISTORY — DX: Emphysema, unspecified: J43.9

## 2018-08-30 LAB — CBC WITH DIFFERENTIAL/PLATELET
Abs Immature Granulocytes: 0.13 10*3/uL — ABNORMAL HIGH (ref 0.00–0.07)
Basophils Absolute: 0.1 10*3/uL (ref 0.0–0.1)
Basophils Relative: 0 %
Eosinophils Absolute: 0.1 10*3/uL (ref 0.0–0.5)
Eosinophils Relative: 0 %
HCT: 43 % (ref 36.0–46.0)
Hemoglobin: 13.1 g/dL (ref 12.0–15.0)
Immature Granulocytes: 1 %
Lymphocytes Relative: 21 %
Lymphs Abs: 3.4 10*3/uL (ref 0.7–4.0)
MCH: 28.9 pg (ref 26.0–34.0)
MCHC: 30.5 g/dL (ref 30.0–36.0)
MCV: 94.9 fL (ref 80.0–100.0)
Monocytes Absolute: 1 10*3/uL (ref 0.1–1.0)
Monocytes Relative: 6 %
Neutro Abs: 11.8 10*3/uL — ABNORMAL HIGH (ref 1.7–7.7)
Neutrophils Relative %: 72 %
Platelets: 214 10*3/uL (ref 150–400)
RBC: 4.53 MIL/uL (ref 3.87–5.11)
RDW: 15.9 % — ABNORMAL HIGH (ref 11.5–15.5)
WBC: 16.4 10*3/uL — ABNORMAL HIGH (ref 4.0–10.5)
nRBC: 0.8 % — ABNORMAL HIGH (ref 0.0–0.2)

## 2018-08-30 LAB — BASIC METABOLIC PANEL
Anion gap: 17 — ABNORMAL HIGH (ref 5–15)
BUN: 20 mg/dL (ref 8–23)
CO2: 15 mmol/L — ABNORMAL LOW (ref 22–32)
Calcium: 8.9 mg/dL (ref 8.9–10.3)
Chloride: 111 mmol/L (ref 98–111)
Creatinine, Ser: 1.41 mg/dL — ABNORMAL HIGH (ref 0.44–1.00)
GFR calc Af Amer: 44 mL/min — ABNORMAL LOW (ref >60)
GFR calc non Af Amer: 38 mL/min — ABNORMAL LOW (ref >60)
Glucose, Bld: 227 mg/dL — ABNORMAL HIGH (ref 70–99)
Potassium: 4.1 mmol/L (ref 3.5–5.1)
Sodium: 143 mmol/L (ref 135–145)

## 2018-08-30 LAB — LACTIC ACID, PLASMA
Lactic Acid, Venous: 5.4 mmol/L (ref 0.5–1.9)
Lactic Acid, Venous: 5.2 mmol/L (ref 0.5–1.9)

## 2018-08-30 LAB — SARS CORONAVIRUS 2 BY RT PCR (HOSPITAL ORDER, PERFORMED IN ~~LOC~~ HOSPITAL LAB): SARS Coronavirus 2: POSITIVE — AB

## 2018-08-30 LAB — TROPONIN I (HIGH SENSITIVITY)
Troponin I (High Sensitivity): 30 ng/L — ABNORMAL HIGH (ref <18)
Troponin I (High Sensitivity): 33 ng/L — ABNORMAL HIGH (ref <18)

## 2018-08-30 LAB — BRAIN NATRIURETIC PEPTIDE: B Natriuretic Peptide: 1070 pg/mL — ABNORMAL HIGH (ref 0.0–100.0)

## 2018-08-30 LAB — D-DIMER, QUANTITATIVE: D-Dimer, Quant: >20 ug/mL-FEU — ABNORMAL HIGH (ref 0.00–0.50)

## 2018-08-30 MED ORDER — IOHEXOL 350 MG/ML SOLN
75.0000 mL | Freq: Once | INTRAVENOUS | Status: AC | PRN
  Administered 2018-08-30: 22:00:00 via INTRAVENOUS

## 2018-08-30 MED ORDER — INSULIN ASPART 100 UNIT/ML ~~LOC~~ SOLN
0.0000 [IU] | SUBCUTANEOUS | Status: DC
  Administered 2018-08-31: 2 [IU] via SUBCUTANEOUS
  Filled 2018-08-30: qty 1

## 2018-08-30 MED ORDER — ACETAMINOPHEN 325 MG PO TABS
650.0000 mg | ORAL_TABLET | Freq: Once | ORAL | Status: AC
  Administered 2018-08-30: 650 mg via ORAL
  Filled 2018-08-30: qty 2

## 2018-08-30 MED ORDER — SODIUM CHLORIDE 0.9 % IV SOLN
INTRAVENOUS | Status: DC
  Administered 2018-08-31: 01:00:00 via INTRAVENOUS

## 2018-08-30 MED ORDER — HEPARIN BOLUS VIA INFUSION
5000.0000 [IU] | Freq: Once | INTRAVENOUS | Status: AC
  Administered 2018-08-31: 01:00:00 5000 [IU] via INTRAVENOUS

## 2018-08-30 MED ORDER — SODIUM CHLORIDE 0.9 % IV BOLUS
250.0000 mL | Freq: Once | INTRAVENOUS | Status: AC
  Administered 2018-08-30: 250 mL via INTRAVENOUS

## 2018-08-30 MED ORDER — HEPARIN (PORCINE) 25000 UT/250ML-% IV SOLN
1250.0000 [IU]/h | INTRAVENOUS | Status: DC
  Administered 2018-08-31: 21:00:00 1250 [IU]/h via INTRAVENOUS
  Administered 2018-08-31: 1300 [IU]/h via INTRAVENOUS
  Filled 2018-08-30 (×2): qty 250

## 2018-08-30 MED ORDER — MORPHINE SULFATE (PF) 2 MG/ML IV SOLN: 1.0000 mg | INTRAVENOUS | Status: DC | PRN

## 2018-08-30 NOTE — ED Notes (Signed)
Attempted Urine collection, unable to urinate at this time.

## 2018-08-30 NOTE — ED Triage Notes (Signed)
Pt states that she is having sob that started last night. She has a cough and her throat is sore

## 2018-08-30 NOTE — ED Provider Notes (Addendum)
[facility] Provider Note   CSN: [id number] Arrival date & time: 08/30/18  1752    History   Chief Complaint Chief Complaint  Patient presents with  . Shortness of Breath    HPI Alexa Castillo is a 68 y.o. female past medical history diabetes, hypertension, history of PE 2 years ago, and recent positive COVID-19 test on 7/29 presents to the ED today complaining of gradual onset, constant, shortness of breath that began yesterday.  Patient reports that after being tested for COVID-19 she began having a mild cough and diarrhea that have since resolved.  She reports that she had an episode of chest pain that lasted about 3 hours the other day.  No other symptoms with the chest pain.  Patient reports she felt so short of breath so she came to the emergency department.  She also complains of a mild sore throat.  She is still able to swallow.  She states that she is over her COVID-19 infection.  Denies fever, chills, diaphoresis, radiation of pain, nausea, vomiting, abdominal pain, any other associated symptoms.  No recent prolonged travel or immobilization.  She does have history of PE 2 years ago.  Reports she was on blood thinners but was taken off sometime in 2019.  Unsure what caused her PE in the first place.        Past Medical History:  Diagnosis Date  . Arthritis    back/saw Dr. Aline Brochure  . Breast cancer ([place])   . Cancer (HCC)    breast  . Diabetes mellitus   . Hypertension   . Low back pain   . Obesity   . Sleep apnea 04/2013    Patient Active Problem List   Diagnosis Date Noted  . Chronic joint pain 10/16/2015  . Pulmonary embolism ([place]) 01/15/2015  . Chest pain on breathing 01/15/2015  . Dyspnea on exertion 01/15/2015  . Troponin level elevated 01/15/2015  . Arthritis   . Hypertension   . Essential hypertension   . PE (pulmonary thromboembolism) ([place])   . Obstructive sleep apnea syndrome in adult 07/20/2013  . Weakness of right leg  03/11/2011  . Left leg weakness 03/11/2011  . Osteoarthritis of hip 03/08/2011  . DDD (degenerative disc disease), lumbar 03/08/2011  . Right breast cancer [id number] ER Positive 2012 07/18/2010    Past Surgical History:  Procedure Laterality Date  . BIOPSY BREAST    . TUBAL LIGATION       OB History    Gravida  3   Para  2   Term  2   Preterm      AB  1   Living  2     SAB  1   TAB      Ectopic      Multiple      Live Births               Home Medications    Prior to Admission medications   Medication Sig Start Date End Date Taking? Authorizing Provider  acetaminophen (TYLENOL 8 HOUR ARTHRITIS PAIN) 650 MG CR tablet Take 1,300 mg by mouth 2 (two) times daily.   Yes [provider]  atorvastatin (LIPITOR) 40 MG tablet Take 40 mg by mouth every morning.  12/05/16  Yes [provider]  glipiZIDE (GLUCOTROL) 10 MG tablet Take 10 mg by mouth 2 (two) times daily before a meal.  06/18/10  Yes [provider]  letrozole (FEMARA) 2.5 MG tablet Take  1 tablet (2.5 mg total) by mouth daily. 06/15/18  Yes Derek Jack, MD  metFORMIN (GLUCOPHAGE) 1000 MG tablet Take 1,000 mg by mouth 2 (two) times daily. 06/20/14  Yes [provider]  metoprolol succinate (TOPROL-XL) 25 MG 24 hr tablet Take 25 mg by mouth every morning.  06/20/14  Yes [provider]    Family History Family History  Problem Relation Age of Onset  . Liver cancer Mother   . Cancer Mother   . Diabetes Mother   . Breast cancer Sister   . Cancer Sister     Social History Social History   Tobacco Use  . Smoking status: Former Smoker    Packs/day: 1.50    Years: 17.00    Pack years: 25.50    Types: Cigarettes    Quit date: 12/18/1994    Years since quitting: 23.7  . Smokeless tobacco: Never Used  Substance Use Topics  . Alcohol use: No  . Drug use: No     Allergies   Lisinopril   Review of Systems Review of Systems  Constitutional: Negative  for chills and fever.  HENT: Positive for sore throat. Negative for congestion.   Eyes: Negative for visual disturbance.  Respiratory: Positive for cough and shortness of breath.   Cardiovascular: Positive for chest pain.  Gastrointestinal: Positive for diarrhea (resolved.). Negative for abdominal pain, nausea and vomiting.  Genitourinary: Negative for difficulty urinating.  Musculoskeletal: Negative for myalgias.  Skin: Negative for rash.  Neurological: Negative for headaches.     Physical Exam Updated Vital Signs BP 130/77 (BP Location: Right Arm)   Pulse (!) 103   Temp 97.8 F (36.6 C) (Oral)   Resp (!) 36   Ht 5\' 2"  (1.575 m)   Wt 102.1 kg   SpO2 90%   BMI 41.15 kg/m   Physical Exam Vitals signs and nursing note reviewed.  Constitutional:      Appearance: She is not ill-appearing or diaphoretic.  HENT:     Head: Normocephalic and atraumatic.     Mouth/Throat:     Mouth: Mucous membranes are moist.     Pharynx: No pharyngeal swelling or oropharyngeal exudate.  Eyes:     Conjunctiva/sclera: Conjunctivae normal.  Neck:     Musculoskeletal: Neck supple.  Cardiovascular:     Rate and Rhythm: Normal rate and regular rhythm.     Pulses: Normal pulses.  Pulmonary:     Effort: Tachypnea present.     Breath sounds: No decreased breath sounds, wheezing, rhonchi or rales.     Comments: Patient tachypneic.  Currently on 2 L oxygen with oxygen saturation 90%. Chest:     Chest wall: No tenderness.  Abdominal:     Palpations: Abdomen is soft.     Tenderness: There is no abdominal tenderness.  Musculoskeletal:     Comments: Bilateral 1+ pitting edema.  Skin:    General: Skin is warm and dry.  Neurological:     Mental Status: She is alert.      ED Treatments / Results  Labs (all labs ordered are listed, but only abnormal results are displayed) Labs Reviewed  SARS CORONAVIRUS 2 (HOSPITAL ORDER, [facility]) - Abnormal; Notable for the  following components:      Result Value   SARS Coronavirus 2 POSITIVE (*)    All other components within normal limits  BASIC METABOLIC PANEL - Abnormal; Notable for the following components:   CO2 15 (*)    Glucose,  Bld 227 (*)    Creatinine, Ser 1.41 (*)    GFR calc non Af Amer 38 (*)    GFR calc Af Amer 44 (*)    Anion gap 17 (*)    All other components within normal limits  CBC WITH DIFFERENTIAL/PLATELET - Abnormal; Notable for the following components:   WBC 16.4 (*)    RDW 15.9 (*)    nRBC 0.8 (*)    Neutro Abs 11.8 (*)    Abs Immature Granulocytes 0.13 (*)    All other components within normal limits  D-DIMER, QUANTITATIVE (NOT AT [facility]) - Abnormal; Notable for the following components:   D-Dimer, Quant >20.00 (*)    All other components within normal limits  LACTIC ACID, PLASMA - Abnormal; Notable for the following components:   Lactic Acid, Venous 5.4 (*)    All other components within normal limits  LACTIC ACID, PLASMA - Abnormal; Notable for the following components:   Lactic Acid, Venous 5.2 (*)    All other components within normal limits  BRAIN NATRIURETIC PEPTIDE - Abnormal; Notable for the following components:   B Natriuretic Peptide 1,070.0 (*)    All other components within normal limits  TROPONIN I (HIGH SENSITIVITY) - Abnormal; Notable for the following components:   Troponin I (High Sensitivity) 30 (*)    All other components within normal limits  TROPONIN I (HIGH SENSITIVITY) - Abnormal; Notable for the following components:   Troponin I (High Sensitivity) 33 (*)    All other components within normal limits  URINALYSIS, ROUTINE W REFLEX MICROSCOPIC    EKG EKG Interpretation  Date/Time:  Monday August 30 2018 18:13:27 EDT Ventricular Rate:  103 PR Interval:    QRS Duration: 94 QT Interval:  410 QTC Calculation: 537 R Axis:   45 Text Interpretation:  Sinus tachycardia Low voltage, extremity and precordial leads Borderline repolarization  abnormality Prolonged QT interval Baseline wander in lead(s) V6 Since last tracing QT has lengthened Confirmed by Daleen Bo [phone]) on 08/30/2018 8:42:10 PM   Radiology Dg Chest Port 1 View  Result Date: 08/30/2018 CLINICAL DATA:  Shortness of breath, COVID positive EXAM: PORTABLE CHEST 1 VIEW COMPARISON:  CTA chest dated 12/25/2016 FINDINGS: Lungs are clear.  No pleural effusion or pneumothorax. Mild cardiomegaly. IMPRESSION: No evidence of acute cardiopulmonary disease. Electronically Signed   By: Julian Hy M.D.   On: 08/30/2018 18:58    Procedures .Critical Care Performed by: Eustaquio Maize, PA-C Authorized by: Eustaquio Maize, PA-C   Critical care provider statement:    Critical care time (minutes):  50   Critical care was necessary to treat or prevent imminent or life-threatening deterioration of the following conditions:  Respiratory failure and circulatory failure   Critical care was time spent personally by me on the following activities:  Discussions with consultants, evaluation of patient's response to treatment, examination of patient, ordering and performing treatments and interventions, ordering and review of laboratory studies, ordering and review of radiographic studies, pulse oximetry, re-evaluation of patient's condition, obtaining history from patient or surrogate and review of old charts   (including critical care time)  Medications Ordered in ED Medications  iohexol (OMNIPAQUE) 350 MG/ML injection 75 mL (has no administration in time range)  sodium chloride 0.9 % bolus 250 mL (250 mLs Intravenous New Bag/Given 08/30/18 2130)  acetaminophen (TYLENOL) tablet 650 mg (650 mg Oral Given 08/30/18 2206)     Initial Impression / Assessment and Plan / ED Course  I have reviewed the triage  vital signs and the nursing notes.  Pertinent labs & imaging results that were available during my care of the patient were reviewed by me and considered in my medical decision  making (see chart for details).  Clinical Course as of Aug 30 2211  Mon Aug 30, 2018  1918 WBC(!): 16.4 [MV]  2005 Creatinine(!): 1.41 [MV]  2005 B Natriuretic Peptide(!): 1,070.0 [MV]  2005 Troponin I (High Sensitivity)(!): 30 [MV]  2017 D-Dimer, Quant(!): >20.00 [MV]  2209 SARS Coronavirus 2(!): POSITIVE [MV]    Clinical Course User Index [MV] Eustaquio Maize, PA-C   68 year old female who was recently diagnosed with COVID-19 on 7/29 who presents to the ED today complaining of shortness of breath.  Patient is tachycardic and tachypneic on exam and has been placed on 2 L oxygen saturation.  O2 saturation currently 90% on 2 L.  Patient states she has been self isolating as she was instructed to do.  She reports history of PE 2 years ago.  She states she was taken off of her blood thinner sometime in 2019.  States that her PE was unprovoked.  Suspect patient is having underlying issues due to COVID-19 infection.  Cannot rule out PE at this time given history.  Will obtain d-dimer at this time.  Chest x-ray/EKG also ordered as well as troponin given patient had chest pain the other day although suspect this is from her coughing.  Patient has bilateral 1+ pitting edema to her extremities.  She denies history of congestive heart failure.  Echo done in 2017 with ejection fraction 60 to 65%.  EKG does not appear to have any ischemic changes today.  Will reevaluate once labs and imaging return although patient may need to be admitted given new oxygen requirement and COVID-19 positive status.   Leukocytosis of 16.4, expected with recent COVID infection.  AKI present with 1.41 creatinine level and 44 GFR.  BNP elevated at greater than 1000.  Initial troponin of 30.  Will repeat.  Patient not currently having any chest pain.  D-dimer elevated at greater than 20.  Questionable elevation in the aboved mentioned labs with recent covid 19 infection.  Discussed case with attending physician Dr. Eulis Foster who suggest  discussing case with hospitalist prior to obtaining CTA of chest given elevated d-dimer is expected with COVID.  Given patient's AKI will give a very small amount of fluids.  Do not want to overload at this time considering she also has a elevated BNP.   Nursing staff informed that lactic acid 5.4.  Repeat COVID test pending.  Discuss case with Dr. Myna Hidalgo with the hospitalist service.  He suggested obtaining CTA prior to admission.  He believes the patienst elevated leukocytosis as well as her elevated troponin are likely from a PE and not from Covid.  We will order this and will re-page afterwards.   10:12 PM Repeat covid test positive.  CTA pending. At shift change case signed out to [facility] PA-C, who will repage Dr. Myna Hidalgo for admission once CTA returns. If positive patient will need to be heparinized.       Final Clinical Impressions(s) / ED Diagnoses   Final diagnoses:  Acute respiratory failure with hypoxia ([place])  COVID-19 virus detected    ED Discharge Orders    None       Eustaquio Maize, PA-C 08/30/18 2213    Eustaquio Maize, PA-C 08/30/18 2325    Daleen Bo, MD 08/31/18 1234

## 2018-08-30 NOTE — ED Notes (Signed)
CRITICAL VALUE ALERT  Critical Value:  Lactic acide 5.4  Date & Time Notied:  08/30/18 2041  Provider Notified: Eulis Foster   Orders Received/Actions taken: monitor

## 2018-08-30 NOTE — ED Notes (Signed)
CRITICAL VALUE ALERT  Critical Value:  Lactic acid 5.2  Date & Time Notied:  08/30/18 2145  Provider Notified: Eulis Foster  Orders Received/Actions taken: monitor,fluid

## 2018-08-30 NOTE — ED Notes (Signed)
Pt to restroom with assistance. Pt severely short windy with exertion. Pt sats on exertion 87% on 2L. Pt back to room with oxygen at 3L sats 88%

## 2018-08-30 NOTE — ED Notes (Signed)
Family members   [place] [phone] [place] [phone] [place] [phone]

## 2018-08-30 NOTE — ED Notes (Addendum)
Date and time results received: 08/30/18 2200 (use smartphrase ".now" to insert current time)  Test: covid Critical Value: positive  Name of Provider Notified: Alroy Bailiff, [place]  Orders Received? Or Actions Taken?: Actions Taken: no orders taken

## 2018-08-30 NOTE — Progress Notes (Signed)
ANTICOAGULATION CONSULT NOTE - Initial Consult  Pharmacy Consult for heparin Indication: pulmonary embolus  Allergies  Allergen Reactions  . Lisinopril Shortness Of Breath, Swelling and Cough    Patient Measurements: Height: 5\' 2"  (157.5 cm) Weight: 225 lb (102.1 kg) IBW/kg (Calculated) : 50.1 Heparin Dosing Weight: 74.5 kg  Vital Signs: Temp: 100 F (37.8 C) (08/17 2138) Temp Source: Rectal (08/17 2138) BP: 112/91 (08/17 2200) Pulse Rate: 106 (08/17 2303)  Labs: Recent Labs    08/30/18 1842 08/30/18 2056  HGB 13.1  --   HCT 43.0  --   PLT 214  --   CREATININE 1.41*  --   TROPONINIHS 30* 33*    Estimated Creatinine Clearance: 42.1 mL/min (A) (by C-G formula based on SCr of 1.41 mg/dL (H)).   Medical History: Past Medical History:  Diagnosis Date  . Arthritis    back/saw Dr. Aline Brochure  . Breast cancer ([place])   . Cancer (HCC)    breast  . Diabetes mellitus   . Hypertension   . Low back pain   . Obesity   . Sleep apnea 04/2013     Assessment: 68yoF presents with SOB. Pharmacy consulted to start heparin for PE. Pt not currently on anticoagulation. CTA chest is concerning for extensive bilateral pulmonary emboli with right heart strain and abnormal appearance to right atrium possibly reflecting additional thrombus . PMH includes diabetes, HTN, breast cancer in 2012, PE 2018 and reports she was on blood thinners but was taken off sometime in 2019. Recent positive COVID-19 test on 7/29 and today (8/17).  Goal of Therapy:  Heparin level 0.3-0.7 units/ml Monitor platelets by anticoagulation protocol: Yes   Plan:  F/u baseline labs Give 5000 units bolus x 1 Start heparin infusion at 1300 units/hr Check anti-Xa level in 8 hours and daily while on heparin Continue to monitor H&H and platelets   Thank you for allowing [place] to participate in this patients care.   Jens Som, PharmD Please utilize [employer] (under [place]) for appropriate number for your unit  pharmacist. 08/30/2018 11:16 PM

## 2018-08-30 NOTE — H&P (Addendum)
History and Physical    Alexa Castillo TOI:[id number] DOB: 05/06/1950 DOA: 08/30/2018  PCP: Abran Richard, MD   Patient coming from: Home   Chief Complaint: SOB   HPI: Alexa Castillo is a 68 y.o. female with medical history significant for cancer of the right breast status post lumpectomy and radiation, history of pulmonary embolism previously on Xarelto, type 2 diabetes mellitus, hypertension, COVID-19 infection diagnosed on 08/11/2018, and chronic low back pain, now presenting to the emergency department for evaluation of shortness of breath.  Patient developed a cough and loose stool almost 3 weeks ago, was diagnosed with COVID-19 on 08/11/2018, and aside from ongoing poor appetite, she had essentially felt back to her usual self until developing some mild pleuritic chest discomfort yesterday, and then acute shortness of breath over the past 1 to 2 days.  She does not feel as though she is having fevers anymore, has ongoing mild cough, denies change in her chronic bilateral lower extremity swelling, and denies lower extremity tenderness.  She has not been experiencing any chest pain today, but noticed some chest discomfort with coughing yesterday.  She had previously been on Xarelto for history of PE and tolerated that without any complications such as bleeding.  ED Course: Upon arrival to the ED, patient is found to be afebrile, requiring 3 L/min of supplemental oxygen to maintain saturation in the low 90s while at rest, tachypneic as high as 40, slightly tachycardic, and with stable blood pressure.  EKG features sinus tachycardia with rate 103 and QTc interval of 537 ms.  Chest x-ray is negative for acute cardiopulmonary disease.  Chemistry panel is notable for glucose of 227, bicarbonate 15, and creatinine 1.41, up from 0.7 in June.  CBC is notable for leukocytosis to 16,400.  Lactic acid is elevated to 5.4.  Troponin is elevated to 30 and BNP elevated to 1070.  D-dimer is ">20," and COVID-19  remains positive.  CTA chest is concerning for extensive bilateral pulmonary emboli with right heart strain, heterogenous appearance in the right atrium which could reflect non-embolized clot, and moderate severe emphysematous changes in the upper lobes bilaterally.  Patient was started on heparin infusion, given a small fluid bolus, and hospitalist consulted for admission.  Review of Systems:  All other systems reviewed and apart from HPI, are negative.  Past Medical History:  Diagnosis Date  . Arthritis    back/saw Dr. Aline Brochure  . Breast cancer ([place])   . Cancer (HCC)    breast  . Diabetes mellitus   . Hypertension   . Low back pain   . Obesity   . Sleep apnea 04/2013    Past Surgical History:  Procedure Laterality Date  . BIOPSY BREAST    . TUBAL LIGATION       reports that she quit smoking about 23 years ago. Her smoking use included cigarettes. She has a 25.50 pack-year smoking history. She has never used smokeless tobacco. She reports that she does not drink alcohol or use drugs.  Allergies  Allergen Reactions  . Lisinopril Shortness Of Breath, Swelling and Cough    Family History  Problem Relation Age of Onset  . Liver cancer Mother   . Cancer Mother   . Diabetes Mother   . Breast cancer Sister   . Cancer Sister      Prior to Admission medications   Medication Sig Start Date End Date Taking? Authorizing Provider  acetaminophen (TYLENOL 8 HOUR ARTHRITIS PAIN) 650 MG CR tablet Take 1,300  mg by mouth 2 (two) times daily.   Yes [provider]  atorvastatin (LIPITOR) 40 MG tablet Take 40 mg by mouth every morning.  12/05/16  Yes [provider]  glipiZIDE (GLUCOTROL) 10 MG tablet Take 10 mg by mouth 2 (two) times daily before a meal.  06/18/10  Yes [provider]  letrozole (FEMARA) 2.5 MG tablet Take 1 tablet (2.5 mg total) by mouth daily. 06/15/18  Yes Derek Jack, MD  metFORMIN (GLUCOPHAGE) 1000 MG tablet Take 1,000 mg by mouth 2  (two) times daily. 06/20/14  Yes [provider]  metoprolol succinate (TOPROL-XL) 25 MG 24 hr tablet Take 25 mg by mouth every morning.  06/20/14  Yes [provider]    Physical Exam: Vitals:   08/30/18 2130 08/30/18 2138 08/30/18 2200 08/30/18 2303  BP: 118/72  (!) 112/91   Pulse: 98  92 (!) 106  Resp: (!) 35  (!) 24 (!) 37  Temp:  100 F (37.8 C)    TempSrc:  Rectal    SpO2: 97% 94% 94% 95%  Weight:      Height:        Constitutional: NAD, calm  Eyes: PERTLA, lids and conjunctivae normal ENMT: Mucous membranes are moist. Posterior pharynx clear of any exudate or lesions.   Neck: normal, supple, no masses, no thyromegaly Respiratory: no wheezing, no crackles. Mild tachypnea. Speaking full sentances. No pallor or cyanosis.  Cardiovascular: S1 & S2 heard, regular rate and rhythm. Mild bilateral lower leg edema. Abdomen: No distension, no tenderness, soft. Bowel sounds active.  Musculoskeletal: no clubbing / cyanosis. No joint deformity upper and lower extremities.    Skin: no significant rashes, lesions, ulcers. Warm, dry, well-perfused. Neurologic: CN 2-12 grossly intact. Sensation intact. Strength 5/5 in all 4 limbs.  Psychiatric: Alert and oriented x 3. Very pleasant, cooperative.    Labs on Admission: I have personally reviewed following labs and imaging studies  CBC: Recent Labs  Lab 08/30/18 1842  WBC 16.4*  NEUTROABS 11.8*  HGB 13.1  HCT 43.0  MCV 94.9  PLT 863   Basic Metabolic Panel: Recent Labs  Lab 08/30/18 1842  NA 143  K 4.1  CL 111  CO2 15*  GLUCOSE 227*  BUN 20  CREATININE 1.41*  CALCIUM 8.9   GFR: Estimated Creatinine Clearance: 42.1 mL/min (A) (by C-G formula based on SCr of 1.41 mg/dL (H)). Liver Function Tests: No results for input(s): AST, ALT, ALKPHOS, BILITOT, PROT, ALBUMIN in the last 168 hours. No results for input(s): LIPASE, AMYLASE in the last 168 hours. No results for input(s): AMMONIA in the last 168 hours.  Coagulation Profile: No results for input(s): INR, PROTIME in the last 168 hours. Cardiac Enzymes: No results for input(s): CKTOTAL, CKMB, CKMBINDEX, TROPONINI in the last 168 hours. BNP (last 3 results) No results for input(s): PROBNP in the last 8760 hours. HbA1C: No results for input(s): HGBA1C in the last 72 hours. CBG: No results for input(s): GLUCAP in the last 168 hours. Lipid Profile: No results for input(s): CHOL, HDL, LDLCALC, TRIG, CHOLHDL, LDLDIRECT in the last 72 hours. Thyroid Function Tests: No results for input(s): TSH, T4TOTAL, FREET4, T3FREE, THYROIDAB in the last 72 hours. Anemia Panel: No results for input(s): VITAMINB12, FOLATE, FERRITIN, TIBC, IRON, RETICCTPCT in the last 72 hours. Urine analysis:    Component Value Date/Time   COLORURINE YELLOW 03/03/2015 0010   APPEARANCEUR CLEAR 03/03/2015 0010   LABSPEC 1.010 03/03/2015 0010   PHURINE 5.5 03/03/2015 0010  GLUCOSEU >1000 (A) 03/03/2015 0010   HGBUR SMALL (A) 03/03/2015 0010   BILIRUBINUR NEGATIVE 03/03/2015 0010   KETONESUR 15 (A) 03/03/2015 0010   PROTEINUR NEGATIVE 03/03/2015 0010   NITRITE POSITIVE (A) 03/03/2015 0010   LEUKOCYTESUR NEGATIVE 03/03/2015 0010   Sepsis Labs: @LABRCNTIP (procalcitonin:4,lacticidven:4) ) Recent Results (from the past 240 hour(s))  SARS Coronavirus 2 [facility] order, Performed in [facility] hospital lab) Nasopharyngeal Nasopharyngeal Swab     Status: Abnormal   Collection Time: 08/30/18  8:20 PM   Specimen: Nasopharyngeal Swab  Result Value Ref Range Status   SARS Coronavirus 2 POSITIVE (A) NEGATIVE Final    Comment: RESULT CALLED TO, READ BACK BY AND VERIFIED WITH: C BELTON,RN @2203  08/30/18 MKELLY (NOTE) If result is NEGATIVE SARS-CoV-2 target nucleic acids are NOT DETECTED. The SARS-CoV-2 RNA is generally detectable in upper and lower  respiratory specimens during the acute phase of infection. The lowest  concentration of SARS-CoV-2 viral copies this assay can  detect is 250  copies / mL. A negative result does not preclude SARS-CoV-2 infection  and should not be used as the sole basis for treatment or other  patient management decisions.  A negative result may occur with  improper specimen collection / handling, submission of specimen other  than nasopharyngeal swab, presence of viral mutation(s) within the  areas targeted by this assay, and inadequate number of viral copies  (<250 copies / mL). A negative result must be combined with clinical  observations, patient history, and epidemiological information. If result is POSITIVE SARS-CoV-2 target nucleic acids are DETECTED. The  SARS-CoV-2 RNA is generally detectable in upper and lower  respiratory specimens during the acute phase of infection.  Positive  results are indicative of active infection with SARS-CoV-2.  Clinical  correlation with patient history and other diagnostic information is  necessary to determine patient infection status.  Positive results do  not rule out bacterial infection or co-infection with other viruses. If result is PRESUMPTIVE POSTIVE SARS-CoV-2 nucleic acids MAY BE PRESENT.   A presumptive positive result was obtained on the submitted specimen  and confirmed on repeat testing.  While 2019 novel coronavirus  (SARS-CoV-2) nucleic acids may be present in the submitted sample  additional confirmatory testing may be necessary for epidemiological  and / or clinical management purposes  to differentiate between  SARS-CoV-2 and other Sarbecovirus currently known to infect humans.  If clinically indicated additional testing with an alternate test  methodology [phone]) is a dvised. The SARS-CoV-2 RNA is generally  detectable in upper and lower respiratory specimens during the acute  phase of infection. The expected result is Negative. Fact Sheet for Patients:  StrictlyIdeas.no Fact Sheet for Healthcare Providers:  BankingDealers.co.za This test is not yet approved or cleared by the [place] FDA and has been authorized for detection and/or diagnosis of SARS-CoV-2 by FDA under an Emergency Use Authorization (EUA).  This EUA will remain in effect (meaning this test can be used) for the duration of the COVID-19 declaration under Section 564(b)(1) of the Act, 21 U.S.C. section 360bbb-3(b)(1), unless the authorization is terminated or revoked sooner. Performed at [facility], [address], [place], [place] [address]      Radiological Exams on Admission: Ct Angio Chest Pe W/cm &/or Wo Cm  Result Date: 08/30/2018 CLINICAL DATA:  Shortness of breath with concern for a PE. EXAM: CT ANGIOGRAPHY CHEST WITH CONTRAST TECHNIQUE: Multidetector CT imaging of the chest was performed using the standard protocol during bolus administration of intravenous contrast.  Multiplanar CT image reconstructions and MIPs were obtained to evaluate the vascular anatomy. CONTRAST:  100 cc of OMNIPAQUE IOHEXOL 350 MG/ML SOLN COMPARISON:  December 25, 2016. FINDINGS: Cardiovascular: Contrast injection is sufficient to demonstrate satisfactory opacification of the pulmonary arteries to the segmental level.There are extensive bilateral pulmonary emboli involving the main right pulmonary artery and the bilateral lobar branches. There is CT evidence of right heart strain with the RV LV ratio measuring approximately 1.3. The main pulmonary artery is dilated measuring approximately 3.2 cm. The heart size is mildly enlarged. Aortic calcifications are noted. The right atrium is heterogeneous. Mediastinum/Nodes: --there is few prominent mediastinal lymph nodes. For example there is a pretracheal lymph node measuring approximately 0.9 cm. There is a lymph node anterior to the carina measuring approximately 1 cm. --No axillary lymphadenopathy. --No supraclavicular lymphadenopathy. --Normal thyroid gland. --The esophagus is  unremarkable Lungs/Pleura: There are moderate to severe emphysematous changes bilaterally. There is no pneumothorax. No large pleural effusion. The trachea is unremarkable. There are few stable micro nodules involving the left lower lobe. Upper Abdomen: No acute abnormality. Musculoskeletal: No chest wall abnormality. No acute or significant osseous findings. Review of the MIP images confirms the above findings. IMPRESSION: 1. Extensive bilateral pulmonary emboli as detailed above, greatest on the right. Positive for acute PE with CT evidence of right heart strain (RV/LV Ratio = 1.3) consistent with at least submassive (intermediate risk) PE. The presence of right heart strain has been associated with an increased risk of morbidity and mortality. 2. Somewhat heterogeneous appearance of the right atrium which is difficult to evaluate secondary to cardiac motion. Differential considerations include mixing artifact, motion artifact, or non embolized clot within the right atrium. This can be further evaluated with ECHO. 3. There are moderate severe emphysematous changes involving the upper lobes bilaterally. 4. Cardiomegaly. 5.  Aortic Atherosclerosis (ICD10-I70.0). These results were called by telephone at the time of interpretation on 08/30/2018 at 10:55 pm to [facility] , who verbally acknowledged these results.[facility] Electronically Signed   By: Constance Holster M.D.   On: 08/30/2018 23:01   Dg Chest Port 1 View  Result Date: 08/30/2018 CLINICAL DATA:  Shortness of breath, COVID positive EXAM: PORTABLE CHEST 1 VIEW COMPARISON:  CTA chest dated 12/25/2016 FINDINGS: Lungs are clear.  No pleural effusion or pneumothorax. Mild cardiomegaly. IMPRESSION: No evidence of acute cardiopulmonary disease. Electronically Signed   By: Julian Hy M.D.   On: 08/30/2018 18:58    EKG: Independently reviewed. Sinus tachycardia (rate 103), QTc 537 ms.   Assessment/Plan   1. Acute PE with heart strain; acute hypoxic  respiratory failure  - Presents with SOB after being diagnosed with COVID-19 on 7/29 despite resolution of her other sxs, is found to have d-dimer >20, elevated BNP despite no CHF hx, and mild troponin elevation  - CTA chest confirms b/l pulmonary embolism with right heart-strain and abnormal appearance to right atrium possibly reflecting additional thrombus  - She has been hemodynamically stable in ED and is not having any chest pain  - Start IV heparin infusion, check stat echocardiogram, trend troponin, hold her beta-blocker, continue supplemental O2, supportive care    ADDENDUM: BP has trended down while RR and O2-requirement trending up. She may be a candidate for reperfusion therapy and case was discussed with PCCM. Appreciate PCCM assistance, will transfer to ICU at [facility] for ongoing mgmt and consideration of reperfusion.    2. COVID-19 infection  - Diagnosed 7/29 after she developed cough and loose stool, remains  positive at time of admission but aside from ongoing loss of appetite, she felt as though the symptoms had resolved  - Decadron considered for COVID-19 with hypoxia, but there does not appear to be atypical PNA findings on CTA chest and primary problem now is the acute PE  - Check/trend inflammatory markers, continue airborne/contact precautions, and continue supportive care   3. Acute kidney injury  - SCr is 1.41 on admission, up from a baseline of 0.7  - Likely prerenal azotemia in setting of poor appetite and recent diarrhea  - She was given a 250 cc NS bolus in ED and continued on gentle IVF hydration   - Renally-dose medications, repeat chem panel in am   4. Type II DM  - No A1c on file  - Managed with glipizide and metformin at home, held on admission  - Check CBG's and use a SSI with Novolog while in hospital    5. Hypertension  - BP at goal  - Hold beta-blocker in light of PE with heart-strain   6. Prolonged QT interval  - QTc is 537 ms in ED  - Continue cardiac  monitoring, replace potassium to 4 and magnesium to 2, minimize QT-prolonging medications, repeat chem panel in am     PPE: CAPR, gown, gloves. Patient wearing mask.  DVT prophylaxis: IV heparin infusion  Code Status: Full  Family Communication: Discussed with patient   Consults called: None   Admission status: Inpatient     Vianne Bulls, MD [facility] Pager [phone]  If 7PM-7AM, please contact night-coverage [email or web address] Password [facility]  08/30/2018, 11:34 PM

## 2018-08-31 ENCOUNTER — Inpatient Hospital Stay (HOSPITAL_COMMUNITY): Payer: Medicare PPO

## 2018-08-31 ENCOUNTER — Encounter (HOSPITAL_COMMUNITY): Payer: Self-pay | Admitting: Pulmonary Disease

## 2018-08-31 DIAGNOSIS — J9601 Acute respiratory failure with hypoxia: Secondary | ICD-10-CM | POA: Insufficient documentation

## 2018-08-31 DIAGNOSIS — I2609 Other pulmonary embolism with acute cor pulmonale: Secondary | ICD-10-CM

## 2018-08-31 DIAGNOSIS — N179 Acute kidney failure, unspecified: Secondary | ICD-10-CM

## 2018-08-31 DIAGNOSIS — I2699 Other pulmonary embolism without acute cor pulmonale: Secondary | ICD-10-CM

## 2018-08-31 DIAGNOSIS — I340 Nonrheumatic mitral (valve) insufficiency: Secondary | ICD-10-CM

## 2018-08-31 DIAGNOSIS — I10 Essential (primary) hypertension: Secondary | ICD-10-CM

## 2018-08-31 DIAGNOSIS — I361 Nonrheumatic tricuspid (valve) insufficiency: Secondary | ICD-10-CM

## 2018-08-31 DIAGNOSIS — E119 Type 2 diabetes mellitus without complications: Secondary | ICD-10-CM

## 2018-08-31 LAB — CBC WITH DIFFERENTIAL/PLATELET
Abs Immature Granulocytes: 0.13 10*3/uL — ABNORMAL HIGH (ref 0.00–0.07)
Basophils Absolute: 0.1 10*3/uL (ref 0.0–0.1)
Basophils Relative: 0 %
Eosinophils Absolute: 0.1 10*3/uL (ref 0.0–0.5)
Eosinophils Relative: 0 %
HCT: 40.9 % (ref 36.0–46.0)
Hemoglobin: 12.2 g/dL (ref 12.0–15.0)
Immature Granulocytes: 1 %
Lymphocytes Relative: 21 %
Lymphs Abs: 3.9 10*3/uL (ref 0.7–4.0)
MCH: 29 pg (ref 26.0–34.0)
MCHC: 29.8 g/dL — ABNORMAL LOW (ref 30.0–36.0)
MCV: 97.1 fL (ref 80.0–100.0)
Monocytes Absolute: 1.2 10*3/uL — ABNORMAL HIGH (ref 0.1–1.0)
Monocytes Relative: 7 %
Neutro Abs: 13.1 10*3/uL — ABNORMAL HIGH (ref 1.7–7.7)
Neutrophils Relative %: 71 %
Platelets: 180 10*3/uL (ref 150–400)
RBC: 4.21 MIL/uL (ref 3.87–5.11)
RDW: 16.5 % — ABNORMAL HIGH (ref 11.5–15.5)
WBC: 18.2 10*3/uL — ABNORMAL HIGH (ref 4.0–10.5)
nRBC: 0.9 % — ABNORMAL HIGH (ref 0.0–0.2)

## 2018-08-31 LAB — ECHOCARDIOGRAM COMPLETE
Height: 62 in
Weight: 3569.69 oz

## 2018-08-31 LAB — GLUCOSE, CAPILLARY
Glucose-Capillary: 123 mg/dL — ABNORMAL HIGH (ref 70–99)
Glucose-Capillary: 110 mg/dL — ABNORMAL HIGH (ref 70–99)
Glucose-Capillary: 114 mg/dL — ABNORMAL HIGH (ref 70–99)
Glucose-Capillary: 105 mg/dL — ABNORMAL HIGH (ref 70–99)
Glucose-Capillary: 160 mg/dL — ABNORMAL HIGH (ref 70–99)

## 2018-08-31 LAB — LACTIC ACID, PLASMA
Lactic Acid, Venous: 4.7 mmol/L (ref 0.5–1.9)
Lactic Acid, Venous: 3.8 mmol/L (ref 0.5–1.9)
Lactic Acid, Venous: 4.5 mmol/L (ref 0.5–1.9)

## 2018-08-31 LAB — PROTIME-INR
INR: 1.4 — ABNORMAL HIGH (ref 0.8–1.2)
INR: 1.5 — ABNORMAL HIGH (ref 0.8–1.2)
Prothrombin Time: 16.7 seconds — ABNORMAL HIGH (ref 11.4–15.2)
Prothrombin Time: 18.1 seconds — ABNORMAL HIGH (ref 11.4–15.2)

## 2018-08-31 LAB — COMPREHENSIVE METABOLIC PANEL
ALT: 89 U/L — ABNORMAL HIGH (ref 0–44)
AST: 94 U/L — ABNORMAL HIGH (ref 15–41)
Albumin: 3 g/dL — ABNORMAL LOW (ref 3.5–5.0)
Alkaline Phosphatase: 104 U/L (ref 38–126)
Anion gap: 10 (ref 5–15)
BUN: 25 mg/dL — ABNORMAL HIGH (ref 8–23)
CO2: 20 mmol/L — ABNORMAL LOW (ref 22–32)
Calcium: 7.9 mg/dL — ABNORMAL LOW (ref 8.9–10.3)
Chloride: 114 mmol/L — ABNORMAL HIGH (ref 98–111)
Creatinine, Ser: 1.44 mg/dL — ABNORMAL HIGH (ref 0.44–1.00)
GFR calc Af Amer: 43 mL/min — ABNORMAL LOW (ref >60)
GFR calc non Af Amer: 37 mL/min — ABNORMAL LOW (ref >60)
Glucose, Bld: 163 mg/dL — ABNORMAL HIGH (ref 70–99)
Potassium: 5.2 mmol/L — ABNORMAL HIGH (ref 3.5–5.1)
Sodium: 144 mmol/L (ref 135–145)
Total Bilirubin: 1.2 mg/dL (ref 0.3–1.2)
Total Protein: 6.5 g/dL (ref 6.5–8.1)

## 2018-08-31 LAB — BASIC METABOLIC PANEL
Anion gap: 14 (ref 5–15)
BUN: 27 mg/dL — ABNORMAL HIGH (ref 8–23)
CO2: 12 mmol/L — ABNORMAL LOW (ref 22–32)
Calcium: 8.1 mg/dL — ABNORMAL LOW (ref 8.9–10.3)
Chloride: 119 mmol/L — ABNORMAL HIGH (ref 98–111)
Creatinine, Ser: 1.43 mg/dL — ABNORMAL HIGH (ref 0.44–1.00)
GFR calc Af Amer: 43 mL/min — ABNORMAL LOW (ref >60)
GFR calc non Af Amer: 37 mL/min — ABNORMAL LOW (ref >60)
Glucose, Bld: 119 mg/dL — ABNORMAL HIGH (ref 70–99)
Potassium: 5.3 mmol/L — ABNORMAL HIGH (ref 3.5–5.1)
Sodium: 145 mmol/L (ref 135–145)

## 2018-08-31 LAB — HEPARIN LEVEL (UNFRACTIONATED)
Heparin Unfractionated: <0.1 IU/mL — ABNORMAL LOW (ref 0.30–0.70)
Heparin Unfractionated: 0.64 IU/mL (ref 0.30–0.70)
Heparin Unfractionated: 0.34 IU/mL (ref 0.30–0.70)

## 2018-08-31 LAB — CBC
HCT: 42.7 % (ref 36.0–46.0)
Hemoglobin: 13.1 g/dL (ref 12.0–15.0)
MCH: 30.1 pg (ref 26.0–34.0)
MCHC: 30.7 g/dL (ref 30.0–36.0)
MCV: 98.2 fL (ref 80.0–100.0)
Platelets: 105 10*3/uL — ABNORMAL LOW (ref 150–400)
RBC: 4.35 MIL/uL (ref 3.87–5.11)
RDW: 16.4 % — ABNORMAL HIGH (ref 11.5–15.5)
WBC: 15.6 10*3/uL — ABNORMAL HIGH (ref 4.0–10.5)
nRBC: 1.9 % — ABNORMAL HIGH (ref 0.0–0.2)

## 2018-08-31 LAB — APTT
aPTT: 104 seconds — ABNORMAL HIGH (ref 24–36)
aPTT: 26 seconds (ref 24–36)
aPTT: 37 seconds — ABNORMAL HIGH (ref 24–36)

## 2018-08-31 LAB — TROPONIN I (HIGH SENSITIVITY): Troponin I (High Sensitivity): 29 ng/L — ABNORMAL HIGH (ref <18)

## 2018-08-31 LAB — ABO/RH: ABO/RH(D): AB POS

## 2018-08-31 LAB — HEMOGLOBIN A1C
Hgb A1c MFr Bld: 8.1 % — ABNORMAL HIGH (ref 4.8–5.6)
Mean Plasma Glucose: 185.77 mg/dL

## 2018-08-31 LAB — MAGNESIUM: Magnesium: 1.8 mg/dL (ref 1.7–2.4)

## 2018-08-31 LAB — C-REACTIVE PROTEIN: CRP: 6.4 mg/dL — ABNORMAL HIGH (ref <1.0)

## 2018-08-31 LAB — D-DIMER, QUANTITATIVE: D-Dimer, Quant: >20 ug/mL-FEU — ABNORMAL HIGH (ref 0.00–0.50)

## 2018-08-31 LAB — FERRITIN: Ferritin: 274 ng/mL (ref 11–307)

## 2018-08-31 LAB — CBG MONITORING, ED: Glucose-Capillary: 162 mg/dL — ABNORMAL HIGH (ref 70–99)

## 2018-08-31 LAB — MRSA PCR SCREENING: MRSA by PCR: NEGATIVE

## 2018-08-31 MED ORDER — LACTATED RINGERS IV SOLN: INTRAVENOUS | Status: DC

## 2018-08-31 MED ORDER — SODIUM CHLORIDE 0.9 % IV SOLN
250.0000 mL | INTRAVENOUS | Status: DC | PRN
  Administered 2018-08-31: 07:00:00 1000 mL via INTRAVENOUS

## 2018-08-31 MED ORDER — INSULIN ASPART 100 UNIT/ML ~~LOC~~ SOLN
0.0000 [IU] | Freq: Three times a day (TID) | SUBCUTANEOUS | Status: DC
  Administered 2018-09-01: 3 [IU] via SUBCUTANEOUS
  Administered 2018-09-01 – 2018-09-03 (×6): 4 [IU] via SUBCUTANEOUS

## 2018-08-31 MED ORDER — SODIUM CHLORIDE 0.9 % IV BOLUS
500.0000 mL | Freq: Once | INTRAVENOUS | Status: AC
  Administered 2018-08-31: 1000 mL via INTRAVENOUS

## 2018-08-31 MED ORDER — SODIUM CHLORIDE 0.9 % IV BOLUS
500.0000 mL | Freq: Once | INTRAVENOUS | Status: AC
  Administered 2018-08-31: 01:00:00 500 mL via INTRAVENOUS

## 2018-08-31 MED ORDER — ATORVASTATIN CALCIUM 40 MG PO TABS
40.0000 mg | ORAL_TABLET | Freq: Every day | ORAL | Status: DC
  Administered 2018-08-31 – 2018-09-02 (×3): 40 mg via ORAL
  Filled 2018-08-31 (×3): qty 1

## 2018-08-31 MED ORDER — SODIUM CHLORIDE 0.9 % IV BOLUS: 500.0000 mL | Freq: Once | INTRAVENOUS | Status: DC

## 2018-08-31 MED ORDER — ORAL CARE MOUTH RINSE
15.0000 mL | Freq: Two times a day (BID) | OROMUCOSAL | Status: DC
  Administered 2018-08-31 – 2018-09-03 (×6): 15 mL via OROMUCOSAL

## 2018-08-31 MED ORDER — SENNOSIDES-DOCUSATE SODIUM 8.6-50 MG PO TABS
1.0000 | ORAL_TABLET | Freq: Every evening | ORAL | Status: DC | PRN
  Filled 2018-08-31: qty 1

## 2018-08-31 MED ORDER — SODIUM CHLORIDE 0.9% FLUSH: 3.0000 mL | INTRAVENOUS | Status: DC | PRN

## 2018-08-31 MED ORDER — SODIUM CHLORIDE 0.9% FLUSH
3.0000 mL | Freq: Two times a day (BID) | INTRAVENOUS | Status: DC
  Administered 2018-08-31 – 2018-09-03 (×7): 3 mL via INTRAVENOUS

## 2018-08-31 MED ORDER — SODIUM CHLORIDE 0.9 % IV SOLN
250.0000 mL | Freq: Once | INTRAVENOUS | Status: AC
  Administered 2018-08-31: 13:00:00 250 mL via INTRAVENOUS

## 2018-08-31 MED ORDER — SODIUM CHLORIDE 0.9 % IV SOLN
INTRAVENOUS | Status: DC
  Administered 2018-08-31: 21:00:00 via INTRAVENOUS

## 2018-08-31 MED ORDER — MAGNESIUM SULFATE 2 GM/50ML IV SOLN
2.0000 g | Freq: Once | INTRAVENOUS | Status: AC
  Administered 2018-08-31: 2 g via INTRAVENOUS
  Filled 2018-08-31: qty 50

## 2018-08-31 MED ORDER — SODIUM CHLORIDE 0.9% FLUSH: 3.0000 mL | Freq: Two times a day (BID) | INTRAVENOUS | Status: DC

## 2018-08-31 MED ORDER — LETROZOLE 2.5 MG PO TABS
2.5000 mg | ORAL_TABLET | Freq: Every day | ORAL | Status: DC
  Filled 2018-08-31: qty 1

## 2018-08-31 MED ORDER — INSULIN ASPART 100 UNIT/ML ~~LOC~~ SOLN
0.0000 [IU] | Freq: Every day | SUBCUTANEOUS | Status: DC
  Administered 2018-09-02: 20:00:00 3 [IU] via SUBCUTANEOUS

## 2018-08-31 MED ORDER — ACETAMINOPHEN 325 MG PO TABS: 650.0000 mg | ORAL_TABLET | Freq: Four times a day (QID) | ORAL | Status: DC | PRN

## 2018-08-31 MED ORDER — ALTEPLASE (PULMONARY EMBOLISM) INFUSION
100.0000 mg | Freq: Once | INTRAVENOUS | Status: AC
  Administered 2018-08-31: 100 mg via INTRAVENOUS
  Filled 2018-08-31: qty 100

## 2018-08-31 MED ORDER — SODIUM CHLORIDE 0.9 % IV BOLUS
1000.0000 mL | Freq: Once | INTRAVENOUS | Status: AC
  Administered 2018-08-31: 03:00:00 1000 mL via INTRAVENOUS

## 2018-08-31 NOTE — Progress Notes (Signed)
ANTICOAGULATION CONSULT NOTE - Initial Consult  Pharmacy Consult for heparin Indication: pulmonary embolus  Allergies  Allergen Reactions  . Lisinopril Shortness Of Breath, Swelling and Cough    Patient Measurements: Height: 5\' 2"  (157.5 cm) Weight: 223 lb 1.7 oz (101.2 kg) IBW/kg (Calculated) : 50.1 Heparin Dosing Weight: 74.5 kg  Vital Signs: Temp: 97.5 F (36.4 C) (08/18 1145) Temp Source: Oral (08/18 1145) BP: 126/77 (08/18 1500) Pulse Rate: 79 (08/18 1500)  Labs: Recent Labs    08/30/18 1842 08/30/18 2056 08/30/18 2355 08/31/18 0322 08/31/18 1134 08/31/18 1458  HGB 13.1  --   --  12.2 13.1  --   HCT 43.0  --   --  40.9 42.7  --   PLT 214  --   --  180 105*  --   APTT  --   --  26  --  104* 37*  LABPROT  --   --  16.7*  --  18.1*  --   INR  --   --  1.4*  --  1.5*  --   HEPARINUNFRC  --   --  <0.10*  --  0.64  --   CREATININE 1.41*  --   --  1.44* 1.43*  --   TROPONINIHS 30* 33* 29*  --   --   --     Estimated Creatinine Clearance: 41.3 mL/min (A) (by C-G formula based on SCr of 1.43 mg/dL (H)).   Medical History: Past Medical History:  Diagnosis Date  . Arthritis    back/saw Dr. Aline Brochure  . Breast cancer ([place])   . Cancer (HCC)    breast  . Diabetes mellitus   . Emphysema lung ([place])   . Hypertension   . Low back pain   . Obesity   . Pulmonary embolism [facility])    January 2017, August 2020  . Sleep apnea 04/2013     Assessment: 67 y/o female with PMH of smoking, HTN, DMII, right breast cancer s/p lumpectomy and radiation, PE 01/2015 (treated with xarelto), arthritis, obesity, OSA, and COVID positive 08/11/18 and 8/17, who presented with SOB. CTA revealed extensive bilateral pulmonary embolism with possible RA thrombus. Patient started on heparin, then received system tPA. Pharmacy consulted to restart heparin for PE s/p systemic tPA. Pt not on anticoagulation PTA.  Aptt @ 1458 on 8/18 (1.5 hours after end of tPA infusion) was 37 seconds. Okay to  restart heparin due to Aptt  <80 with no signs/symptoms of bleeding. Hg/hct stable at 13.1/42.7. Plts low at 105 (dropped from 180), continue to monitor closely. Pt was previously therapeutic on heparin rate of 1300 units/hr (heparin level 0.64). Restart at slightly lower rate.  Goal of Therapy:  Heparin level 0.3-0.7 units/ml Monitor platelets by anticoagulation protocol: Yes   Plan:  Will not bolus given recent tPA infusion Restart heparin infusion at 1250 units/hr Check anti-Xa level in 6 hours and daily while on heparin Monitor CBC daily on heparin Continue to monitor H&H, platelets, signs/symptoms of bleeding   Thank you for allowing [place] to participate in this patients care.   Sherren Kerns, PharmD PGY1 Acute Care Pharmacy Resident [phone] 08/31/2018 3:49 PM

## 2018-08-31 NOTE — Progress Notes (Signed)
Reassessed patient post alteplase.  Patient resting comfortably in bed without distress. Acknowledges provider / appropriate in response / moving all extremities. Respiratory rate in 20's, no increase in work of breathing.  Will continue to monitor.    Noe Gens, NP-C [facility] Pgr: [phone] or if no answer [phone] 08/31/2018, 2:32 PM

## 2018-08-31 NOTE — Plan of Care (Signed)

## 2018-08-31 NOTE — Progress Notes (Signed)
ANTICOAGULATION CONSULT NOTE   Pharmacy Consult for heparin Indication: pulmonary embolus   Patient Measurements: Height: 5\' 2"  (157.5 cm) Weight: 223 lb 1.7 oz (101.2 kg) IBW/kg (Calculated) : 50.1 Heparin Dosing Weight: 74.5 kg  Vital Signs: Temp: 97.5 F (36.4 C) (08/18 1145) Temp Source: Oral (08/18 1145) BP: 125/71 (08/18 2200) Pulse Rate: 76 (08/18 2200)  Labs: Recent Labs    08/30/18 1842 08/30/18 2056 08/30/18 2355 08/31/18 0322 08/31/18 1134 08/31/18 1458 08/31/18 2154  HGB 13.1  --   --  12.2 13.1  --   --   HCT 43.0  --   --  40.9 42.7  --   --   PLT 214  --   --  180 105*  --   --   APTT  --   --  26  --  104* 37*  --   LABPROT  --   --  16.7*  --  18.1*  --   --   INR  --   --  1.4*  --  1.5*  --   --   HEPARINUNFRC  --   --  <0.10*  --  0.64  --  0.34  CREATININE 1.41*  --   --  1.44* 1.43*  --   --   TROPONINIHS 30* 33* 29*  --   --   --   --      Medical History: Past Medical History:  Diagnosis Date  . Arthritis    back/saw Dr. Aline Brochure  . Breast cancer ([place])   . Cancer (HCC)    breast  . Diabetes mellitus   . Emphysema lung ([place])   . Hypertension   . Low back pain   . Obesity   . Pulmonary embolism [facility])    January 2017, August 2020  . Sleep apnea 04/2013     Assessment: 68 y/o female with PMH of smoking, HTN, DMII, right breast cancer s/p lumpectomy and radiation, PE 01/2015 (treated with xarelto), arthritis, obesity, OSA, and COVID positive 08/11/18 and 8/17, who presented with SOB. CTA revealed extensive bilateral pulmonary embolism with possible RA thrombus. Patient started on heparin, then received systemic tPA. Pharmacy consulted to restart heparin for PE s/p systemic tPA. Pt is not on anticoagulation PTA.  Heparin level is now therapeutic.   Goal of Therapy:  Heparin level 0.3-0.7 units/ml Monitor platelets by anticoagulation protocol: Yes   Plan:  Continue heparin infusion at 1250 units/hr Monitor CBC daily on  heparin Continue to monitor H&H, platelets, signs/symptoms of bleeding Next level with AM labs   Harvel Quale 08/31/2018 11:38 PM

## 2018-08-31 NOTE — Progress Notes (Signed)
Bilateral lower extremity edema is complete. Results are located under CV Proc.  Alexa Castillo

## 2018-08-31 NOTE — Progress Notes (Addendum)
NAME:  Alexa Castillo, MRN:  [id number], DOB:  [birth year], LOS: 1 ADMISSION DATE:  08/30/2018, CONSULTATION DATE:  08/31/2018 REFERRING MD:  Dr. Myna Hidalgo [facility], CHIEF COMPLAINT:  PE/ hypoxia  Brief History   60 yoF COVID positive (7/29) presenting with SOB and pleuritic chest pain found to have extensive submassive PE with evidence of right heart strain (RV/LV 1.3) and question of possible right atrial clot.  Developed worsening hypotension, increased O2 demands, and tachypnea prompting tx to [facility] for ICU monitoring and consideration of further thrombolytic therapy.   Past Medical History  Former smoker, hypertension, DMT2, right breast cancer s/p lumpectomy and radiation, pulmonary embolism 01/2015, previously on Xarelto, arthritis, obesity, OSA  COVID 19 infection diagnosed 08/11/2018  Significant Hospital Events   8/18 Admit with PE / tx to [facility]  Consults:  IR  Procedures:    Significant Diagnostic Tests:  CTA PE 8/17 >> Extensive bilateral pulmonary emboli as detailed above, greatest on the right. Positive for acute PE with CT evidence of right heart strain (RV/LV Ratio = 1.3) consistent with at least submassive (intermediate risk) PE. The presence of right heart strain has been associated with an increased risk of morbidity and mortality. Somewhat heterogeneous appearance of the right atrium which is difficult to evaluate secondary to cardiac motion. Differential considerations include mixing artifact, motion artifact, or non embolized clot within the right atrium. This can be further evaluated with ECHO. There are moderate severe emphysematous changes involving the upper lobes bilaterally. Cardiomegaly. Aortic Atherosclerosis   Culture:  8/17 SARS CoV-2 >> positive   Antimicrobials:    Interim history/subjective:  O2 increased to 30 flow rate / 45% FiO2.  TEE in progress. Tmax 100. Pt asking for food.    Objective   Blood pressure 109/69, pulse 84, temperature 100 F (37.8 C),  temperature source Rectal, resp. rate (!) 41, height 5\' 2"  (1.575 m), weight 101.2 kg, SpO2 97 %.    FiO2 (%):  [45 %] 45 %   Intake/Output Summary (Last 24 hours) at 08/31/2018 0832 Last data filed at 08/31/2018 0700 Gross per 24 hour  Intake 2250.07 ml  Output -  Net 2250.07 ml   Filed Weights   08/30/18 1803 08/31/18 0500  Weight: 102.1 kg 101.2 kg   Examination: General: adult female sitting up in bed in NAD HEENT: MM pink/moist, HFNC O2 Neuro: awake, alert, interacts appropriately with staff CV: SR in 80's on monitor PULM:  tachypneic to 30's at rest / 40's with exertion  GI: round  Remote exam performed at this time, see full exam from 0500.  Will examine once TTE data returned and can discuss full plan of care with patient in regards to EKOS vs heparin vs other intervention if RA thrombus present.  SEE BELOW.   Resolved Hospital Problem list    Assessment & Plan:   Acute Hypoxic Respiratory Failure  Extensive Submassive PE  -with right heart strain, RV / LV ratio 1.3, elevated BNP and hs-troponin -abnormal appearance of right atrium with concern of thrombus -previous hx of unprovoked PE in 2017; negative thrombophilia eval off Xarelto at some point in 2019 -PESI Score 119 / Class IV, high risk  P:  Continue ICU monitoring  IR consulted for possible EKOS > await TTE for assessment of RA thrombus  Wean HFNC for saturations > 90% Continue IV heparin gtt per pharmacy  Await LE venous doppler for DVT assessment  If hemodynamic change, will consider TNK   Hypotension - currently normotensive -  concern for RV dysfunction/ failure with PE vs ?sepsis component given leukocytosis  -flat high sensitivity troponin trend  P:  ICU monitoring  Hold home lopressor  MAP Goal > 65  Await TTE  Follow sepsis evaluation  Repeat lactic acid at 1130  COVID 19 Infection -initially dx 7/29, repeat testing remains positive  -suspect acute symptoms related to PE and not contribution  of COVID P:  Airborne / contact precautions   AKI AGMA / Lactic Acidosis  Hyperkalemia  P:  Trend BMP / urinary output Replace electrolytes as indicated Avoid nephrotoxic agents, ensure adequate renal perfusion Repeat BMP at 1130 to follow up sr cr, K NS at 50 ml/hr  DM II  P:  CBG Q4 SSI, sensitive scale Hold home glipizide / metformin   Prolonged QTc  P:  EKG in am  Goal K>4, Mg>2 Avoid QT prolonging agents > home meds reviewed   Hx of Right Breast Cancer P:  Follow up with ONC at [facility] post discharge  Stop home letrozole given thromboembolism risk   Best practice:  Diet: NPO Pain/Anxiety/Delirium protocol (if indicated): n/a VAP protocol (if indicated): n/a DVT prophylaxis: heparin IV GI prophylaxis: PPI Glucose control: SSI Mobility: BR Code Status: Full, although patient prefers not to be intubated, is ok with CPR and short term life support only. Family Communication: patient updated on plan of care early am 8/18.  NOK is her two sons. Will update on plan once TTE resulted.  Disposition: ICU  Labs   CBC: Recent Labs  Lab 08/30/18 1842 08/31/18 0322  WBC 16.4* 18.2*  NEUTROABS 11.8* 13.1*  HGB 13.1 12.2  HCT 43.0 40.9  MCV 94.9 97.1  PLT 214 258    Basic Metabolic Panel: Recent Labs  Lab 08/30/18 1842 08/31/18 0322  NA 143 144  K 4.1 5.2*  CL 111 114*  CO2 15* 20*  GLUCOSE 227* 163*  BUN 20 25*  CREATININE 1.41* 1.44*  CALCIUM 8.9 7.9*  MG  --  1.8   GFR: Estimated Creatinine Clearance: 41 mL/min (A) (by C-G formula based on SCr of 1.44 mg/dL (H)). Recent Labs  Lab 08/30/18 1842 08/30/18 1853 08/30/18 2056 08/31/18 0033 08/31/18 0322  WBC 16.4*  --   --   --  18.2*  LATICACIDVEN  --  5.4* 5.2* 4.7* 3.8*    Liver Function Tests: Recent Labs  Lab 08/31/18 0322  AST 94*  ALT 89*  ALKPHOS 104  BILITOT 1.2  PROT 6.5  ALBUMIN 3.0*   No results for input(s): LIPASE, AMYLASE in the last 168 hours. No results for input(s):  AMMONIA in the last 168 hours.  ABG    Component Value Date/Time   TCO2 24 08/15/2015 1127     Coagulation Profile: Recent Labs  Lab 08/30/18 2355  INR 1.4*    Cardiac Enzymes: No results for input(s): CKTOTAL, CKMB, CKMBINDEX, TROPONINI in the last 168 hours.  HbA1C: No results found for: HGBA1C  CBG: Recent Labs  Lab 08/31/18 0044 08/31/18 0443 08/31/18 0821  GLUCAP 162* 123* 110*    CRITICAL CARE Performed by: Noe Gens   Total critical care time: 30 minutes  Critical care time was exclusive of separately billable procedures and treating other patients.  Critical care was necessary to treat or prevent imminent or life-threatening deterioration.  Critical care was time spent personally by me on the following activities: development of treatment plan with patient and/or surrogate as well as nursing, discussions with consultants, evaluation of patient's response  to treatment, examination of patient, obtaining history from patient or surrogate, ordering and performing treatments and interventions, ordering and review of laboratory studies, ordering and review of radiographic studies, pulse oximetry and re-evaluation of patient's condition.  Noe Gens, NP-C [facility] Pgr: [phone] or if no answer [phone] 08/31/2018, 9:13 AM     1100 Addendum: ECHO reviewed with attending MD, Cardiology.  IR previously consulted for possible EKOS.  After ECHO review with findings of severely reduced RV systolic function, severely dilated RA, large (9.5 cm) mobile thrombus in the RA extending across the TV leaflet into the RV > decision made with care team to proceed with thrombolytics (alteplase) protocol.  We reviewed risks and benefits with patient and sons Oceanographer and Risk manager).  Risks of bleeding, stroke and death reviewed with patients and sons.  Patient denies recent surgeries, she is post menopausal but no hysterectomy.  Will continue to monitor  closely in ICU.      Noe Gens, NP-C [facility] Pgr: [phone] or if no answer [phone] 08/31/2018, 11:10 AM

## 2018-08-31 NOTE — H&P (Addendum)
NAME:  Alexa Castillo, MRN:  [id number], DOB:  [birth year], LOS: 1 ADMISSION DATE:  08/30/2018, CONSULTATION DATE:  08/31/2018 REFERRING MD:  Dr. Myna Hidalgo [facility], CHIEF COMPLAINT:  PE/ hypoxia  Brief History   24 yoF [place] presenting with SOB and pleuritic chest pain found to have extensive submassive PE with evidence of right heart strain and question of possible right atrial clot.  Developed worsening hypotension, increased O2 demands, and tachypnea prompting tx to [facility] for ICU monitoring and consideration of further thrombolytic therapy.   History of present illness    68 year old female with history of hypertension, DMT2, right breast cancer s/p lumpectomy and radiation currently on letrozole, pulmonary embolism previously on Xarelto, OSA, and recent COVID 19 infection diagnosed 08/11/2018 who presented to [facility] 8/17 with sore throat, shortness of breath, mild pleuritic chest pain and lower leg swelling onset for 2 days. Patient reports mild pain behind left knee and that she has not been as active as she normally is at home recently.  Patient has had a mild cough and loose stools since her COVID diagnosis three weeks prior.  Diarrhea has since resolved.  Reports ongoing productive cough with greenish sputum.  Denies any recent fever, chills, syncope, palpitations, nausea, vomiting, change in stools, abdominal pain, bleeding tendencies, prolonged travel, or known family history of clotting disorders.    Had prior PE diagnosed 01/2015 with negative thrombophilia evaluation, completed 6 months of Xarelto but found to have residual pulmonary emboli in 08/2015, restarted on Xarelto with follow up imaging 12/2016 showing no occlusive pulmonary embolus.  It is unclear when she stopped taking her Xarelto in 2019.    In ER, patient found afebrile, tachypneic, slightly tachycardic, normotensive and hypoxic requiring around 3L [place].  Labs noted for COVID positive, CO2 15, glucose 227, sCr 1.41 (prior 0.74), WBC  16.4, D-dimer >20, INR 1.4, PT 16.7, BNP 1070, hs troponin 30-> 33-> 29, lactic 5.4-> 4.7.  EKG noted for ST at 103 with prolonged QTc 537, CXR negative, CTA PE showed extensive bilateral PE, with evidence of right heart strain with RV/ LV ratio 1.3, moderate severe emphysematous changes in upper lobes bilaterally, and heterogenous appearance in the right atrium, evaluation difficult due to cardiac motion with differentials including non-embolized clot, motion artifact or mixing artifact. She was started on heparin IV and to be admitted to [facility] however, patient developed increasing oxygen requirements and worsening tachypnea with lower blood pressure of systolic into the 60'F somewhat improved with small fluid bolus.  Therefore, patient transferred to [facility] for ICU monitoring and consideration of further thrombolytic therapy, PCCM to admit.   Past Medical History  Former smoker, hypertension, DMT2, right breast cancer s/p lumpectomy and radiation, pulmonary embolism 01/2015, previously on Xarelto, arthritis, obesity, OSA  COVID 19 infection diagnosed 08/11/2018 Significant Hospital Events   8/18 Admit / tx to [facility]  Consults:  IR  Procedures:   Significant Diagnostic Tests:  8/17 CTA PE >>  1. Extensive bilateral pulmonary emboli as detailed above, greatest on the right. Positive for acute PE with CT evidence of right heart strain (RV/LV Ratio = 1.3) consistent with at least submassive (intermediate risk) PE. The presence of right heart strain has been associated with an increased risk of morbidity and mortality. 2. Somewhat heterogeneous appearance of the right atrium which is  difficult to evaluate secondary to cardiac motion. Differential considerations include mixing artifact, motion artifact, or non embolized clot within the right atrium. This can be further evaluated with  ECHO. 3. There are moderate severe emphysematous changes involving the upper lobes bilaterally. 4. Cardiomegaly. 5.  Aortic  Atherosclerosis   Culture:  8/17 SARS CoV-2 >> positive   Antimicrobials:   Interim history/subjective:  Arrived on 3L [place] R IV AC bleeding, removed with questionable arterial flow, manual pressure being held.  Patient currently without complaint, other than mild SOB.    Objective   Blood pressure 91/66, pulse 87, temperature 100 F (37.8 C), temperature source Rectal, resp. rate (!) 23, height 5\' 2"  (1.575 m), weight 102.1 kg, SpO2 96 %.    FiO2 (%):  [45 %] 45 %   Intake/Output Summary (Last 24 hours) at 08/31/2018 4782 Last data filed at 08/31/2018 9562 Gross per 24 hour  Intake 750 ml  Output -  Net 750 ml   Filed Weights   08/30/18 1803  Weight: 102.1 kg   Examination: General:  Well nourished adult female sitting upright in bed in NAD HEENT: MM pink/moist, pupils4/reactive, poor dentition Neuro: Alert/ oriented x 4, MAE, non focal  CV: RR, no mumur PULM:  tachypneic in the 30-50's at times, non labored and able to speak full sentences without difficulty, clear anteriorly, diminished in bases GI: obese, soft, bs active, NT/ ND Extremities: warm/dry, +2 LE edema  Skin: no rashes   Assessed with full PPE in negative pressure room  Resolved Hospital Problem list    Assessment & Plan:   Hypoxic respiratory failure  Extensive Submassive PE with right heart strain, RV/ LV ratio 1.3, elevated BNP and hs-troponin - abnormal appearance of right atrium with concern of thrombus - previous hx of unprovoked PE in 2017; negative thrombophilia evaluation off Xarelto at some point in 2019 P:  ICU monitoring Start heated HFNC to help with tachypnea for goal sat >92% Continue heparin IV  (currently being held till bleeding stopped from prior R AC IV site, out on arrival- ?arterial cannulation given high pressure bleeding, pharmacy is aware and will check heparin level now) Consult IR for possible EKOS after TTE this am to better evaluate ?RA thrombus first BLE ultrasounds to  r/o DVT Consider systemic lytics if becomes unstable  Hypotension - currently normotensive - concern for RV dysfunction/ failure with PE vs ?sepsis component given leukocytosis  - hs troponin trend flat P:  Hold home metoprolol Goal MAP >65 Lactic resolving  TTE as above Repeat EKG Send BC / trend WBC/ fever curve   COVID 19 Infection - initially dx 7/29, repeat testing remains positive  P:  Hypoxia thought related to acute PE given absence of atypical CT findings Pending inflammatory markers Airborne / contact isolation  AKI AGMA/ lactic acidosis  P:  Gentle IV hydration Trend renal panel/ mag, daily wts, I/Os  DMT2 P:  CBG q4 SSI  Holding home glipizide/ metformin   Prolonged QTc  P:  Tele monitoring Goal K>4/ Mag >2 Mag 2gm now Avoid QTc prolonging meds   Hx of right breast cancer P:  Followed by [facility] oncology, up to date on cancer screenings.  Continue home letrozole   Best practice:  Diet: NPO Pain/Anxiety/Delirium protocol (if indicated): n/a VAP protocol (if indicated): n/a DVT prophylaxis: heparin IV GI prophylaxis: PPI Glucose control: SSI Mobility: BR Code Status: Full, although patient prefers not to be intubated, is ok with CPR and short term life support only. Family Communication: patient updated on plan of care.  NOK is her two sons. Disposition: ICU  Labs   CBC: Recent Labs  Lab 08/30/18 1842  08/31/18 0322  WBC 16.4* 18.2*  NEUTROABS 11.8* PENDING  HGB 13.1 12.2  HCT 43.0 40.9  MCV 94.9 97.1  PLT 214 010    Basic Metabolic Panel: Recent Labs  Lab 08/30/18 1842 08/31/18 0322  NA 143 144  K 4.1 5.2*  CL 111 114*  CO2 15* 20*  GLUCOSE 227* 163*  BUN 20 25*  CREATININE 1.41* 1.44*  CALCIUM 8.9 7.9*  MG  --  1.8   GFR: Estimated Creatinine Clearance: 41.3 mL/min (A) (by C-G formula based on SCr of 1.44 mg/dL (H)). Recent Labs  Lab 08/30/18 1842 08/30/18 1853 08/30/18 2056 08/31/18 0033 08/31/18 0322  WBC 16.4*   --   --   --  18.2*  LATICACIDVEN  --  5.4* 5.2* 4.7* 3.8*    Liver Function Tests: Recent Labs  Lab 08/31/18 0322  AST 94*  ALT 89*  ALKPHOS 104  BILITOT 1.2  PROT 6.5  ALBUMIN 3.0*   No results for input(s): LIPASE, AMYLASE in the last 168 hours. No results for input(s): AMMONIA in the last 168 hours.  ABG    Component Value Date/Time   TCO2 24 08/15/2015 1127     Coagulation Profile: Recent Labs  Lab 08/30/18 2355  INR 1.4*    Cardiac Enzymes: No results for input(s): CKTOTAL, CKMB, CKMBINDEX, TROPONINI in the last 168 hours.  HbA1C: No results found for: HGBA1C  CBG: Recent Labs  Lab 08/31/18 0044 08/31/18 0443  GLUCAP 162* 123*    Review of Systems:   Review of Systems  Constitutional: Positive for malaise/fatigue. Negative for chills, fever and weight loss.  HENT: Negative for nosebleeds.   Eyes: Negative for blurred vision and double vision.  Respiratory: Positive for cough, sputum production and shortness of breath. Negative for hemoptysis and wheezing.   Cardiovascular: Positive for leg swelling. Negative for palpitations and orthopnea.  Gastrointestinal: Negative for abdominal pain, blood in stool, constipation, diarrhea, melena, nausea and vomiting.  Genitourinary: Negative for dysuria and hematuria.  Musculoskeletal: Negative for falls.  Skin: Negative for rash.  Neurological: Negative for focal weakness, loss of consciousness and headaches.  Endo/Heme/Allergies: Does not bruise/bleed easily.    Past Medical History  She,  has a past medical history of Arthritis, Breast cancer ([place]), Cancer ([place]), Diabetes mellitus, Hypertension, Low back pain, Obesity, and Sleep apnea (04/2013).   Surgical History    Past Surgical History:  Procedure Laterality Date  . BIOPSY BREAST    . TUBAL LIGATION       Social History   reports that she quit smoking about 23 years ago. Her smoking use included cigarettes. She has a 25.50 pack-year smoking  history. She has never used smokeless tobacco. She reports that she does not drink alcohol or use drugs.   Family History   Her family history includes Breast cancer in her sister; Cancer in her mother and sister; Diabetes in her mother; Liver cancer in her mother.   Allergies Allergies  Allergen Reactions  . Lisinopril Shortness Of Breath, Swelling and Cough     Home Medications  Prior to Admission medications   Medication Sig Start Date End Date Taking? Authorizing Provider  acetaminophen (TYLENOL 8 HOUR ARTHRITIS PAIN) 650 MG CR tablet Take 1,300 mg by mouth 2 (two) times daily.   Yes [provider]  atorvastatin (LIPITOR) 40 MG tablet Take 40 mg by mouth every morning.  12/05/16  Yes [provider]  glipiZIDE (GLUCOTROL) 10 MG tablet Take 10 mg by mouth  2 (two) times daily before a meal.  06/18/10  Yes [provider]  letrozole (FEMARA) 2.5 MG tablet Take 1 tablet (2.5 mg total) by mouth daily. 06/15/18  Yes Derek Jack, MD  metFORMIN (GLUCOPHAGE) 1000 MG tablet Take 1,000 mg by mouth 2 (two) times daily. 06/20/14  Yes [provider]  metoprolol succinate (TOPROL-XL) 25 MG 24 hr tablet Take 25 mg by mouth every morning.  06/20/14  Yes [provider]     Critical care time: 50 mins    Kennieth Rad, MSN, AGACNP-BC [facility] Pgr: [phone] or if no answer [phone] 08/31/2018, 5:52 AM

## 2018-08-31 NOTE — Progress Notes (Signed)
Echocardiogram 2D Echocardiogram has been performed.  Alexa Castillo 08/31/2018, 9:17 AM

## 2018-08-31 NOTE — ED Notes (Signed)
HOspitalist made aware of BP soft 24M systolic. Pt is alert and denies pain. Pt resp rate remains elevated.

## 2018-09-01 DIAGNOSIS — I2602 Saddle embolus of pulmonary artery with acute cor pulmonale: Secondary | ICD-10-CM

## 2018-09-01 LAB — COMPREHENSIVE METABOLIC PANEL
ALT: 182 U/L — ABNORMAL HIGH (ref 0–44)
AST: 191 U/L — ABNORMAL HIGH (ref 15–41)
Albumin: 2.5 g/dL — ABNORMAL LOW (ref 3.5–5.0)
Alkaline Phosphatase: 119 U/L (ref 38–126)
Anion gap: 10 (ref 5–15)
BUN: 20 mg/dL (ref 8–23)
CO2: 18 mmol/L — ABNORMAL LOW (ref 22–32)
Calcium: 8.1 mg/dL — ABNORMAL LOW (ref 8.9–10.3)
Chloride: 115 mmol/L — ABNORMAL HIGH (ref 98–111)
Creatinine, Ser: 0.87 mg/dL (ref 0.44–1.00)
GFR calc Af Amer: >60 mL/min (ref >60)
GFR calc non Af Amer: >60 mL/min (ref >60)
Glucose, Bld: 127 mg/dL — ABNORMAL HIGH (ref 70–99)
Potassium: 4.2 mmol/L (ref 3.5–5.1)
Sodium: 143 mmol/L (ref 135–145)
Total Bilirubin: 1 mg/dL (ref 0.3–1.2)
Total Protein: 5.6 g/dL — ABNORMAL LOW (ref 6.5–8.1)

## 2018-09-01 LAB — CBC WITH DIFFERENTIAL/PLATELET
Abs Immature Granulocytes: 0.12 10*3/uL — ABNORMAL HIGH (ref 0.00–0.07)
Basophils Absolute: 0.1 10*3/uL (ref 0.0–0.1)
Basophils Relative: 1 %
Eosinophils Absolute: 0.4 10*3/uL (ref 0.0–0.5)
Eosinophils Relative: 3 %
HCT: 34.2 % — ABNORMAL LOW (ref 36.0–46.0)
Hemoglobin: 11 g/dL — ABNORMAL LOW (ref 12.0–15.0)
Immature Granulocytes: 1 %
Lymphocytes Relative: 27 %
Lymphs Abs: 3.6 10*3/uL (ref 0.7–4.0)
MCH: 29.6 pg (ref 26.0–34.0)
MCHC: 32.2 g/dL (ref 30.0–36.0)
MCV: 91.9 fL (ref 80.0–100.0)
Monocytes Absolute: 0.8 10*3/uL (ref 0.1–1.0)
Monocytes Relative: 6 %
Neutro Abs: 8.1 10*3/uL — ABNORMAL HIGH (ref 1.7–7.7)
Neutrophils Relative %: 62 %
Platelets: 145 10*3/uL — ABNORMAL LOW (ref 150–400)
RBC: 3.72 MIL/uL — ABNORMAL LOW (ref 3.87–5.11)
RDW: 16.2 % — ABNORMAL HIGH (ref 11.5–15.5)
WBC: 13 10*3/uL — ABNORMAL HIGH (ref 4.0–10.5)
nRBC: 1.8 % — ABNORMAL HIGH (ref 0.0–0.2)

## 2018-09-01 LAB — GLUCOSE, CAPILLARY
Glucose-Capillary: 164 mg/dL — ABNORMAL HIGH (ref 70–99)
Glucose-Capillary: 132 mg/dL — ABNORMAL HIGH (ref 70–99)
Glucose-Capillary: 179 mg/dL — ABNORMAL HIGH (ref 70–99)

## 2018-09-01 LAB — HIV ANTIBODY (ROUTINE TESTING W REFLEX): HIV Screen 4th Generation wRfx: NONREACTIVE

## 2018-09-01 LAB — FERRITIN: Ferritin: 478 ng/mL — ABNORMAL HIGH (ref 11–307)

## 2018-09-01 LAB — MAGNESIUM: Magnesium: 2 mg/dL (ref 1.7–2.4)

## 2018-09-01 LAB — HEPARIN LEVEL (UNFRACTIONATED): Heparin Unfractionated: 0.45 IU/mL (ref 0.30–0.70)

## 2018-09-01 LAB — C-REACTIVE PROTEIN: CRP: 6.8 mg/dL — ABNORMAL HIGH (ref <1.0)

## 2018-09-01 LAB — D-DIMER, QUANTITATIVE: D-Dimer, Quant: >20 ug/mL-FEU — ABNORMAL HIGH (ref 0.00–0.50)

## 2018-09-01 MED ORDER — RIVAROXABAN 15 MG PO TABS
15.0000 mg | ORAL_TABLET | Freq: Two times a day (BID) | ORAL | Status: DC
  Administered 2018-09-01 – 2018-09-03 (×5): 15 mg via ORAL
  Filled 2018-09-01 (×6): qty 1

## 2018-09-01 MED ORDER — CHLORHEXIDINE GLUCONATE CLOTH 2 % EX PADS
6.0000 | MEDICATED_PAD | Freq: Every day | CUTANEOUS | Status: DC
  Administered 2018-09-01 – 2018-09-02 (×2): 6 via TOPICAL

## 2018-09-01 NOTE — Discharge Instructions (Addendum)
Bleeding Precautions When on Anticoagulant Therapy, Adult Anticoagulant therapy, also called blood thinner therapy, is medicine that helps to prevent and treat blood clots. The medicine works by stopping blood clots from forming or growing. Blood clots that form in your blood vessels can be dangerous. They can break loose and travel to the heart, lungs, or brain. This increases the risk of a heart attack, stroke, or blocked lung artery (pulmonary embolism). Anticoagulants also increase the risk of bleeding. Try to protect yourself from cuts and other injuries that can cause bleeding. It is important to take anticoagulants exactly as told by your health care provider. Why do I need to be on anticoagulant therapy? You may need this medicine if you are at risk of developing a blood clot. Conditions that increase your risk of a blood clot include:  Being born with heart disease or a heart malformation (congenital heart disease).  Developing heart disease.  Having had surgery, such as valve replacement.  Having had a serious accident or other type of severe injury (trauma).  Having certain types of cancer.  Having certain diseases that can increase blood clotting.  Having a high risk of stroke or heart attack.  Having atrial fibrillation (AF). What are the common anticoagulant medicines? There are several types of anticoagulant medicines. The most common types are:  Medicines that you take by mouth (oral medicines), such as: ? Warfarin. ? Novel oral anticoagulants (NOACs), such as: ? Direct thrombin inhibitors (dabigatran). ? Factor Xa inhibitors (apixaban, edoxaban, and rivaroxaban).  Injections, such as: ? Unfractionated heparin. ? Low molecular weight heparin. These anticoagulants work in different ways to prevent blood clots. They also have different risks and side effects. What do I need to remember while on anticoagulant therapy? Taking anticoagulants  Take your medicine at  the same time every day. If you forget to take your medicine, take it as soon as you remember. Do not double your dosage of medicine if you miss a whole day. Take your normal dose and call your health care provider.  Do not stop taking your medicine unless your health care provider approves. Stopping the medicine can increase your risk of developing a blood clot. Taking other medicines  Take over-the-counter and prescriptions medicines only as told by your health care provider.  Do not take over-the-counter NSAIDs, including aspirin and ibuprofen, while you are on anticoagulant therapy. These medicines increase your risk of dangerous bleeding.  Get approval from your health care provider before you start taking any new medicines, vitamins, or herbal products. Some of these could interfere with your therapy. General instructions  Keep all follow-up visits as told by your health care provider. This is important.  If you are pregnant or trying to get pregnant, talk with a health care provider about anticoagulants. Some of these medicines are not safe to take during pregnancy.  Tell all health care providers, including your dentist, that you are on anticoagulant therapy. It is especially important to tell providers before you have any surgery, medical procedures, or dental work done. What precautions should I take?   Be very careful when using knives, scissors, or other sharp objects.  Use an electric razor instead of a blade.  Do not use toothpicks.  Use a soft-bristled toothbrush. Brush your teeth gently.  Always wear shoes outdoors and wear slippers indoors.  Be careful when cutting your fingernails and toenails.  Place bath mats in the bathroom. If possible, install handrails as well.  Wear gloves while you  do yard work.  Wear your seat belt.  Prevent falls by removing loose rugs and extension cords from areas where you walk. Use a cane or walker if you need it.  Avoid  constipation by: ? Drinking enough fluid to keep your urine clear or pale yellow. ? Eating foods that are high in fiber, such as fresh fruits and vegetables, whole grains, and beans. ? Limiting foods that are high in fat and processed sugars, such as fried and sweet foods.  Do not play contact sports or participate in other activities that have a high risk for injury. What other precautions are important if on warfarin therapy? If you are taking a type of anticoagulant called warfarin, make sure you:  Work with a diet and nutrition specialist (dietitian) to make an eating plan. Do not make any sudden changes to your diet after you have started your eating plan.  Do not drink alcohol. It can interfere with your medicine and increase your risk of an injury that causes bleeding.  Get regular blood tests as told by your health care provider. What are some questions to ask my health care provider?  Why do I need anticoagulant therapy?  What is the best anticoagulant therapy for my condition?  How long will I need anticoagulant therapy?  What are the side effects of anticoagulant therapy?  When should I take my medicine? What should I do if I forget to take it?  Will I need to have regular blood tests?  Do I need to change my diet? Are there foods or drinks that I should avoid?  What activities are safe for me?  What should I do if I want to get pregnant? Contact a health care provider if:  You miss a dose of medicine: ? And you are not sure what to do. ? For more than one day.  You have: ? Menstrual bleeding that is heavier than normal. ? Bloody or brown urine. ? Easy bruising. ? Black and tarry stool or bright red stool. ? Side effects from your medicine.  You feel weak or dizzy.  You become pregnant. Get help right away if:  You have bleeding that will not stop within 20 minutes from: ? The nose. ? The gums. ? A cut on the skin.  You have a severe headache or  stomachache.  You vomit or cough up blood.  You fall or hit your head. Summary  Anticoagulant therapy, also called blood thinner therapy, is medicine that helps to prevent and treat blood clots.  Anticoagulants work in different ways to prevent blood clots. They also have different risks and side effects.  Talk with your health care provider about any precautions that you should take while on anticoagulant therapy. This information is not intended to replace advice given to you by your health care provider. Make sure you discuss any questions you have with your health care provider. Document Released: 12/11/2014 Document Revised: 04/21/2018 Document Reviewed: 03/18/2016 Elsevier Patient Education  [place] on my medicine - XARELTO (rivaroxaban)  This medication education was reviewed with me or my healthcare representative as part of my discharge preparation.  The pharmacist that spoke with me during my hospital stay was: Sherren Kerns, [place]? Xarelto was prescribed to treat blood clots that may have been found in the veins of your legs (deep vein thrombosis) or in your lungs (pulmonary embolism) and to reduce the risk of them occurring again.  What  do you need to know about Xarelto? The starting dose is one 15 mg tablet taken TWICE daily with food for the FIRST 21 DAYS then on 09/23/18 the dose is changed to one 20 mg tablet taken ONCE A DAY with your evening meal.  DO NOT stop taking Xarelto without talking to the health care provider who prescribed the medication.  Refill your prescription for 20 mg tablets before you run out.  After discharge, you should have regular check-up appointments with your healthcare provider that is prescribing your Xarelto.  In the future your dose may need to be changed if your kidney function changes by a significant amount.  What do you do if you miss a dose? If you are taking Xarelto TWICE  DAILY and you miss a dose, take it as soon as you remember. You may take two 15 mg tablets (total 30 mg) at the same time then resume your regularly scheduled 15 mg twice daily the next day.  If you are taking Xarelto ONCE DAILY and you miss a dose, take it as soon as you remember on the same day then continue your regularly scheduled once daily regimen the next day. Do not take two doses of Xarelto at the same time.   Important Safety Information Xarelto is a blood thinner medicine that can cause bleeding. You should call your healthcare provider right away if you experience any of the following: ? Bleeding from an injury or your nose that does not stop. ? Unusual colored urine (red or dark brown) or unusual colored stools (red or black). ? Unusual bruising for unknown reasons. ? A serious fall or if you hit your head (even if there is no bleeding).  Some medicines may interact with Xarelto and might increase your risk of bleeding while on Xarelto. To help avoid this, consult your healthcare provider or pharmacist prior to using any new prescription or non-prescription medications, including herbals, vitamins, non-steroidal anti-inflammatory drugs (NSAIDs) and supplements.  This website has more information on Xarelto: [email or web address]

## 2018-09-01 NOTE — Progress Notes (Signed)
NAME:  Alexa Castillo, MRN:  [id number], DOB:  01-07-1951, LOS: 2 ADMISSION DATE:  08/30/2018, CONSULTATION DATE:  08/31/2018 REFERRING MD:  Dr. Myna Hidalgo [facility], CHIEF COMPLAINT:  PE/ hypoxia  Brief History   45 yoF COVID positive (7/29) presenting with SOB and pleuritic chest pain found to have extensive submassive PE with evidence of right heart strain (RV/LV 1.3) and question of possible right atrial clot.  Developed worsening hypotension, increased O2 demands, and tachypnea prompting tx to [facility] for ICU monitoring and consideration of further thrombolytic therapy.   Past Medical History  Former smoker, hypertension, DMT2, right breast cancer s/p lumpectomy and radiation, pulmonary embolism 01/2015, previously on Xarelto, arthritis, obesity, OSA  COVID 19 infection diagnosed 08/11/2018  Significant Hospital Events   8/18 Admit with PE / tx to [facility] 8/18 systemic TPA  Consults:    Procedures:    Significant Diagnostic Tests:  CTA PE 8/17 >> Extensive bilateral pulmonary emboli as detailed above, greatest on the right. Positive for acute PE with CT evidence of right heart strain (RV/LV Ratio = 1.3) consistent with at least submassive (intermediate risk) PE. The presence of right heart strain has been associated with an increased risk of morbidity and mortality. Somewhat heterogeneous appearance of the right atrium which is difficult to evaluate secondary to cardiac motion. Differential considerations include mixing artifact, motion artifact, or non embolized clot within the right atrium. This can be further evaluated with ECHO. There are moderate severe emphysematous changes involving the upper lobes bilaterally. Cardiomegaly. Aortic Atherosclerosis   Culture:  8/17 SARS CoV-2 >> positive   Antimicrobials:   Interim history/subjective:  No events, feeling/breathing better.  Some cough, mostly dry. Denies dizziness/chest pain/hemoptysis.  Objective   Blood pressure 112/66, pulse 88,  temperature 98.4 F (36.9 C), temperature source Oral, resp. rate (!) 29, height 5\' 2"  (1.575 m), weight 103.1 kg, SpO2 99 %.    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  S/p systemic tPA with good results.  - Switch from heparin gtt to NoAC - At least 6 months therapy given severity of clot - Argument can be made for lifelong - Defer to OP PCP vs. Oncologist regarding thrombophlia workup, do not feel strongly in setting of COVID+letrozole - Wean O2, up to chair this afternoon  COVID 19 Infection-initially dx 7/29, repeat testing remains positive, suspect acute symptoms related to PE and not contribution of COVID - Supportive care, airborne precautions  AKI- improved, gap turned to hyperchloremic NAGMA - Stop IVF, encourage PO, trend BMET  DM II- sugars fine -CBG Q4 -SSI, sensitive scale -Start diet -Hold home glipizide / metformin   Prolonged QTc  - EKG today, avoid agents that prolong this, check AM mg  Hx of Right Breast Cancer P:  -Follow up with [facility] at [facility] post discharge  -  Stop home letrozole given thromboembolism risk   Best practice:  Diet: cardiac carb controlled Pain/Anxiety/Delirium protocol (if indicated): n/a VAP protocol (if indicated): n/a DVT prophylaxis: NoAC GI prophylaxis: PPI Glucose control: SSI Mobility: up to chair Code Status: Full  Disposition: ICU  Erskine Emery  MD

## 2018-09-02 LAB — CBC WITH DIFFERENTIAL/PLATELET
Abs Immature Granulocytes: 0.06 10*3/uL (ref 0.00–0.07)
Basophils Absolute: 0 10*3/uL (ref 0.0–0.1)
Basophils Relative: 0 %
Eosinophils Absolute: 0.2 10*3/uL (ref 0.0–0.5)
Eosinophils Relative: 2 %
HCT: 32.4 % — ABNORMAL LOW (ref 36.0–46.0)
Hemoglobin: 10.2 g/dL — ABNORMAL LOW (ref 12.0–15.0)
Immature Granulocytes: 1 %
Lymphocytes Relative: 28 %
Lymphs Abs: 2.5 10*3/uL (ref 0.7–4.0)
MCH: 29.9 pg (ref 26.0–34.0)
MCHC: 31.5 g/dL (ref 30.0–36.0)
MCV: 95 fL (ref 80.0–100.0)
Monocytes Absolute: 0.6 10*3/uL (ref 0.1–1.0)
Monocytes Relative: 7 %
Neutro Abs: 5.5 10*3/uL (ref 1.7–7.7)
Neutrophils Relative %: 62 %
Platelets: 180 10*3/uL (ref 150–400)
RBC: 3.41 MIL/uL — ABNORMAL LOW (ref 3.87–5.11)
RDW: 15.9 % — ABNORMAL HIGH (ref 11.5–15.5)
WBC: 8.8 10*3/uL (ref 4.0–10.5)
nRBC: 1 % — ABNORMAL HIGH (ref 0.0–0.2)

## 2018-09-02 LAB — GLUCOSE, CAPILLARY
Glucose-Capillary: 152 mg/dL — ABNORMAL HIGH (ref 70–99)
Glucose-Capillary: 182 mg/dL — ABNORMAL HIGH (ref 70–99)
Glucose-Capillary: 181 mg/dL — ABNORMAL HIGH (ref 70–99)
Glucose-Capillary: 274 mg/dL — ABNORMAL HIGH (ref 70–99)

## 2018-09-02 LAB — COMPREHENSIVE METABOLIC PANEL
ALT: 177 U/L — ABNORMAL HIGH (ref 0–44)
AST: 131 U/L — ABNORMAL HIGH (ref 15–41)
Albumin: 2.5 g/dL — ABNORMAL LOW (ref 3.5–5.0)
Alkaline Phosphatase: 112 U/L (ref 38–126)
Anion gap: 9 (ref 5–15)
BUN: 15 mg/dL (ref 8–23)
CO2: 23 mmol/L (ref 22–32)
Calcium: 8.2 mg/dL — ABNORMAL LOW (ref 8.9–10.3)
Chloride: 107 mmol/L (ref 98–111)
Creatinine, Ser: 0.97 mg/dL (ref 0.44–1.00)
GFR calc Af Amer: >60 mL/min (ref >60)
GFR calc non Af Amer: 60 mL/min — ABNORMAL LOW (ref >60)
Glucose, Bld: 176 mg/dL — ABNORMAL HIGH (ref 70–99)
Potassium: 3.8 mmol/L (ref 3.5–5.1)
Sodium: 139 mmol/L (ref 135–145)
Total Bilirubin: 0.8 mg/dL (ref 0.3–1.2)
Total Protein: 5.6 g/dL — ABNORMAL LOW (ref 6.5–8.1)

## 2018-09-02 LAB — MAGNESIUM: Magnesium: 1.6 mg/dL — ABNORMAL LOW (ref 1.7–2.4)

## 2018-09-02 MED ORDER — MAGNESIUM SULFATE 4 GM/100ML IV SOLN
4.0000 g | Freq: Once | INTRAVENOUS | Status: AC
  Administered 2018-09-02: 13:00:00 4 g via INTRAVENOUS
  Filled 2018-09-02: qty 100

## 2018-09-02 NOTE — Progress Notes (Signed)
NAME:  Alexa Castillo, MRN:  [id number], DOB:  1950/06/13, LOS: 3 ADMISSION DATE:  08/30/2018, CONSULTATION DATE:  08/31/2018 REFERRING MD:  Dr. Myna Hidalgo [facility], CHIEF COMPLAINT:  PE/ hypoxia  Brief History   50 yoF COVID positive (7/29) presenting with SOB and pleuritic chest pain found to have extensive submassive PE with evidence of right heart strain (RV/LV 1.3) and question of possible right atrial clot.  Developed worsening hypotension, increased O2 demands, and tachypnea prompting tx to [facility] for ICU monitoring and consideration of further thrombolytic therapy.   Past Medical History  Former smoker, hypertension, DMT2, right breast cancer s/p lumpectomy and radiation, pulmonary embolism 01/2015, previously on Xarelto, arthritis, obesity, OSA  COVID 19 infection diagnosed 08/11/2018  Significant Hospital Events   8/18 Admit with PE / tx to [facility] 8/18 systemic TPA  Consults:    Procedures:    Significant Diagnostic Tests:  CTA PE 8/17 >> Extensive bilateral pulmonary emboli as detailed above, greatest on the right. Positive for acute PE with CT evidence of right heart strain (RV/LV Ratio = 1.3) consistent with at least submassive (intermediate risk) PE. The presence of right heart strain has been associated with an increased risk of morbidity and mortality. Somewhat heterogeneous appearance of the right atrium which is difficult to evaluate secondary to cardiac motion. Differential considerations include mixing artifact, motion artifact, or non embolized clot within the right atrium. This can be further evaluated with ECHO. There are moderate severe emphysematous changes involving the upper lobes bilaterally. Cardiomegaly. Aortic Atherosclerosis   Culture:  8/17 SARS CoV-2 >> positive   Antimicrobials:   Interim history/subjective:  Continues to improve, o2 weaning off.  PT has been consulted. Has dry cough.  Denies chest pain, dizziness.  Tolerating diet.  Objective   Blood pressure  139/81, pulse 76, temperature 98.9 F (37.2 C), temperature source Oral, resp. rate (!) 27, height 5\' 2"  (1.575 m), weight 104.3 kg, SpO2 96 %.        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  S/p systemic tPA with good results.  - Continue xarelto intro dosing x 21 days then 20mg  daily - Would do at least 6 mo therapy, argument can be made for lifelong AC - Defer to OP PCP vs. Oncologist regarding thrombophlia workup, do not feel strongly in setting of COVID+letrozole - Wean O2, up to chair this afternoon  COVID 19 Infection-initially dx 7/29, repeat testing remains positive, suspect acute symptoms related to PE and not contribution of COVID - Supportive care, airborne precautions  DM II- sugars fine -CBG Q4 -SSI, sensitive scale -Hold home glipizide / metformin   Prolonged QTc - improved on yesterday's EKG - Replete Mg today  Hx of Right Breast Cancer P:  -Follow up with ONC at [facility] post discharge  -Stop home letrozole given thromboembolism risk   Best practice:  Diet: cardiac  carb controlled Pain/Anxiety/Delirium protocol (if indicated): n/a VAP protocol (if indicated): n/a DVT prophylaxis: NoAC GI prophylaxis: PPI Glucose control: SSI Mobility: up to chair Code Status: Full  Disposition: [facility] COVID+ patient, will ask hospitalists to take over care starting tomorrow, appreciate  help   Erskine Emery MD

## 2018-09-03 DIAGNOSIS — U071 COVID-19: Secondary | ICD-10-CM

## 2018-09-03 LAB — COMPREHENSIVE METABOLIC PANEL
ALT: 147 U/L — ABNORMAL HIGH (ref 0–44)
AST: 82 U/L — ABNORMAL HIGH (ref 15–41)
Albumin: 2.6 g/dL — ABNORMAL LOW (ref 3.5–5.0)
Alkaline Phosphatase: 104 U/L (ref 38–126)
Anion gap: 7 (ref 5–15)
BUN: 10 mg/dL (ref 8–23)
CO2: 25 mmol/L (ref 22–32)
Calcium: 8.5 mg/dL — ABNORMAL LOW (ref 8.9–10.3)
Chloride: 106 mmol/L (ref 98–111)
Creatinine, Ser: 0.82 mg/dL (ref 0.44–1.00)
GFR calc Af Amer: >60 mL/min (ref >60)
GFR calc non Af Amer: >60 mL/min (ref >60)
Glucose, Bld: 188 mg/dL — ABNORMAL HIGH (ref 70–99)
Potassium: 3.8 mmol/L (ref 3.5–5.1)
Sodium: 138 mmol/L (ref 135–145)
Total Bilirubin: 0.9 mg/dL (ref 0.3–1.2)
Total Protein: 5.8 g/dL — ABNORMAL LOW (ref 6.5–8.1)

## 2018-09-03 LAB — CBC WITH DIFFERENTIAL/PLATELET
Abs Immature Granulocytes: 0.09 10*3/uL — ABNORMAL HIGH (ref 0.00–0.07)
Basophils Absolute: 0 10*3/uL (ref 0.0–0.1)
Basophils Relative: 0 %
Eosinophils Absolute: 0.3 10*3/uL (ref 0.0–0.5)
Eosinophils Relative: 3 %
HCT: 33.8 % — ABNORMAL LOW (ref 36.0–46.0)
Hemoglobin: 10.5 g/dL — ABNORMAL LOW (ref 12.0–15.0)
Immature Granulocytes: 1 %
Lymphocytes Relative: 26 %
Lymphs Abs: 2.6 10*3/uL (ref 0.7–4.0)
MCH: 29.2 pg (ref 26.0–34.0)
MCHC: 31.1 g/dL (ref 30.0–36.0)
MCV: 94.2 fL (ref 80.0–100.0)
Monocytes Absolute: 0.9 10*3/uL (ref 0.1–1.0)
Monocytes Relative: 9 %
Neutro Abs: 5.9 10*3/uL (ref 1.7–7.7)
Neutrophils Relative %: 61 %
Platelets: 187 10*3/uL (ref 150–400)
RBC: 3.59 MIL/uL — ABNORMAL LOW (ref 3.87–5.11)
RDW: 15.8 % — ABNORMAL HIGH (ref 11.5–15.5)
WBC: 9.7 10*3/uL (ref 4.0–10.5)
nRBC: 0.4 % — ABNORMAL HIGH (ref 0.0–0.2)

## 2018-09-03 LAB — MAGNESIUM: Magnesium: 1.7 mg/dL (ref 1.7–2.4)

## 2018-09-03 LAB — GLUCOSE, CAPILLARY: Glucose-Capillary: 166 mg/dL — ABNORMAL HIGH (ref 70–99)

## 2018-09-03 MED ORDER — MAGNESIUM SULFATE 4 GM/100ML IV SOLN
4.0000 g | Freq: Once | INTRAVENOUS | Status: AC
  Administered 2018-09-03: 10:00:00 4 g via INTRAVENOUS
  Filled 2018-09-03: qty 100

## 2018-09-03 MED ORDER — RIVAROXABAN (XARELTO) VTE STARTER PACK (15 & 20 MG): ORAL_TABLET | ORAL | 0 refills | Status: DC

## 2018-09-03 MED ORDER — FENTANYL CITRATE (PF) 100 MCG/2ML IJ SOLN: 12.5000 ug | Freq: Once | INTRAMUSCULAR | Status: DC

## 2018-09-03 MED FILL — XARELTO STARTER PACK: 15 & 20 | 30 days supply | Qty: 51 | Fill #0

## 2018-09-03 NOTE — Progress Notes (Signed)
Discharge instructions and medications reviewed with patient, all questions answered.  Patient taken by wheelchair to [facility].

## 2018-09-03 NOTE — Progress Notes (Deleted)
[facility] Progress Note Patient Name: Alexa Castillo DOB: March 15, 1950 MRN: [id number]   Date of Service  09/03/2018  HPI/Events of Note  Pt with subjective pain based on affect per RN. Obvious reason for pain is not apparent.  eICU Interventions  Fentanyl 12.5 mg iv x 1        [place] U Ogan 09/03/2018, 3:07 AM

## 2018-09-03 NOTE — TOC Benefit Eligibility Note (Signed)
Transition of Care [facility]) Benefit Eligibility Note    Patient Details  Name: Alexa Castillo MRN: [id number] Date of Birth: Jun 18, 1950   Medication/Dose: Xarelto 15mg  and 20mg  BID  Covered?: Yes  Tier: 3 Drug  Prescription Coverage Preferred Pharmacy: [facility]  Spoke with Person/Company/Phone Number:: Alver Fisher  Co-Pay: 8.95 for 15mg  and 20mg  BID  Prior Approval: No  Deductible: (no deductible)       Orbie Pyo Phone Number: 09/03/2018, 9:17 AM

## 2018-09-03 NOTE — Progress Notes (Signed)
Inpatient Diabetes Program Recommendations  AACE/ADA: New Consensus Statement on Inpatient Glycemic Control  Target Ranges:  Prepandial:   less than 140 mg/dL      Peak postprandial:   less than 180 mg/dL (1-2 hours)      Critically ill patients:  140 - 180 mg/dL   Results for Alexa Castillo, Alexa Castillo (MRN [id number]) as of 09/03/2018 10:12  Ref. Range 09/02/2018 08:50 09/02/2018 12:43 09/02/2018 17:44 09/02/2018 19:57 09/03/2018 07:41  Glucose-Capillary Latest Ref Range: 70 - 99 mg/dL 152 (H) 182 (H) 181 (H) 274 (H) 166 (H)   Review of Glycemic Control  Diabetes history: DM2 Outpatient Diabetes medications: Glipizide 10 mg BID, Metformin 1000 mg BID Current orders for Inpatient glycemic control: Novolog 0-20 units TID with meals, Novolog 0-5 units QHS  Inpatient Diabetes Program Recommendations:   Insulin-Meal Coverage: Please consider ordering Novolog 3 units TID with meals for meal coverage if patient eats at least 50% of meals.  Thanks, Barnie Alderman, RN, MSN, CDE Diabetes Coordinator Inpatient Diabetes Program [phone] (Team Pager from 8am to 5pm)

## 2018-09-03 NOTE — Evaluation (Signed)
Physical Therapy Evaluation Patient Details Name: Alexa Castillo MRN: [id number] DOB: 1950-02-09 Today's Date: 09/03/2018   History of Present Illness  Pt with Covid positive on 7/29 and presented 8/17 with worsening SOB and chest pain. Pt found to have extensive submassive PE bilaterally.  PMH - HTN, DM, breast CA, PE, obesity, OSA  Clinical Impression  Pt doing well with mobility and no further PT needed.  Ready for dc from PT standpoint. Pt does require supplemental oxygen for activity.       Follow Up Recommendations No PT follow up    Equipment Recommendations  None recommended by PT    Recommendations for Other Services       Precautions / Restrictions Restrictions Weight Bearing Restrictions: No      Mobility  Bed Mobility               General bed mobility comments: Pt up in chair  Transfers Overall transfer level: Modified independent Equipment used: None                Ambulation/Gait Ambulation/Gait assistance: Modified independent (Device/Increase time) Gait Distance (Feet): 100 Feet Assistive device: None Gait Pattern/deviations: Step-through pattern;Decreased stride length Gait velocity: decr Gait velocity interpretation: 1.31 - 2.62 ft/sec, indicative of limited community ambulator General Gait Details: Steady gait. Amb on RA with SpO2 to 81%. Amb on 3L of O2 with SpO2 94%.  Stairs            Wheelchair Mobility    Modified Rankin (Stroke Patients Only)       Balance Overall balance assessment: Mild deficits observed, not formally tested                                           Pertinent Vitals/Pain Pain Assessment: No/denies pain    Home Living Family/patient expects to be discharged to:: Private residence Living Arrangements: Alone Available Help at Discharge: Family;Available PRN/intermittently Type of Home: Apartment Home Access: Level entry     Home Layout: One level Home Equipment: Walker  - 4 wheels Additional Comments: Pt plans to stay with son for a few days after dc    Prior Function Level of Independence: Independent with assistive device(s)         Comments: Uses rollator when cooking due to limited standing tolerance due to back pain     Hand Dominance        Extremity/Trunk Assessment   Upper Extremity Assessment Upper Extremity Assessment: Overall WFL for tasks assessed    Lower Extremity Assessment Lower Extremity Assessment: Overall WFL for tasks assessed       Communication   Communication: No difficulties  Cognition Arousal/Alertness: Awake/alert Behavior During Therapy: WFL for tasks assessed/performed Overall Cognitive Status: Within Functional Limits for tasks assessed                                        General Comments      Exercises     Assessment/Plan    PT Assessment Patent does not need any further PT services  PT Problem List         PT Treatment Interventions      PT Goals (Current goals can be found in the Care Plan section)  Acute Rehab PT Goals PT Goal Formulation: All  assessment and education complete, DC therapy    Frequency     Barriers to discharge        Co-evaluation               AM-PAC PT "6 Clicks" Mobility  Outcome Measure Help needed turning from your back to your side while in a flat bed without using bedrails?: None Help needed moving from lying on your back to sitting on the side of a flat bed without using bedrails?: None Help needed moving to and from a bed to a chair (including a wheelchair)?: None Help needed standing up from a chair using your arms (e.g., wheelchair or bedside chair)?: None Help needed to walk in hospital room?: None Help needed climbing 3-5 steps with a railing? : A Little 6 Click Score: 23    End of Session Equipment Utilized During Treatment: Oxygen Activity Tolerance: Patient tolerated treatment well Patient left: in chair;with call  bell/phone within reach Nurse Communication: Mobility status PT Visit Diagnosis: Other abnormalities of gait and mobility (R26.89)    Time: [id number] PT Time Calculation (min) (ACUTE ONLY): 22 min   Charges:   PT Evaluation $PT Eval Moderate Complexity: [place] Pager [phone] Office [place] 09/03/2018, 11:05 AM

## 2018-09-03 NOTE — Progress Notes (Signed)
SATURATION QUALIFICATIONS: (This note is used to comply with regulatory documentation for home oxygen)  Patient Saturations on Room Air at Rest = 93%  Patient Saturations on Room Air while Ambulating = 81%  Patient Saturations on 3 Liters of oxygen while Ambulating = 94%  Please briefly explain why patient needs home oxygen: Pt unable to maintain adequate oxygenation with activity without supplemental O2.   [place] Pager [phone] Office [phone]

## 2018-09-03 NOTE — Discharge Summary (Addendum)
Physician Discharge Summary  Patient ID: Alexa Castillo MRN: [id number] DOB/AGE: November 14, 1950 68 y.o.  Admit date: 08/30/2018 Discharge date: 09/03/2018    Discharge Diagnoses:  Acute Pulmonary Embolism  Acute Hypoxemic Respiratory Failure  Hx COVID Positive  Hx Breast Cancer  DM II Prolonged QTC AKI                                                                     DISCHARGE PLAN BY DIAGNOSIS      Acute Pulmonary Embolism   Discharge Plan: Xarelto starter pack with transition to 20 mg QD Patient will need life long anticoagulation Follow up with ONC regarding Femara use given recurrent DVT  Acute Hypoxemic Respiratory Failure  Discharge Plan: Will need to follow up with PCP in one month for re-evaluation of O2 needs.  She will likely be able to come off O2.    Hx COVID Positive   Discharge Plan: No acute follow up    Hx Breast Cancer   Discharge Plan: Follow up with Oncology as above  DM II   Discharge Plan: Resume home diabetes regimen  Follow up with PCP regarding glucose control   Prolonged QTC  Discharge Plan: Resolved  Follow up QTC with primary MD  ? If related to hypomagnesemia on admit, no offending medications noted    AKI  Discharge Plan: Resolved, no acute follow up at this time                       DISCHARGE SUMMARY   68 year old female who presented to [facility] on 8/17 with reports of pleuritic chest pain and shortness of breath.  She was recently diagnosed with COVID on 7/29.  The patient's COVID related symptoms included a mild cough and diarrhea.  Diarrhea had since resolved.  She carries a history of right-sided breast cancer on Femara, prior PE with negative thrombophilia evaluation in January 2017.  She remained on Xarelto for 6 months initially but was later found in August 2017 to have residual pulmonary emboli and restarted on Xarelto.  Follow-up imaging in 12/2016 showed no occlusive pulmonary embolus and  Xarelto was stopped at some point in 2019.  Initial presentation found the patient to be afebrile, tachypneic, tachycardic and hypoxic requiring 3 L O2.  Her COVID screening remain positive.  D-dimer was greater than 20, BNP 1070, lactate 5.4, serum creatinine 1.41, high-sensitivity troponin 30 and WBC 16.4.  EKG showed sinus tachycardia with prolonged QTC.  Initial chest x-ray was negative.  CT NGO PE showed extensive bilateral PE with evidence of right heart strain with RV to LV ratio of 1.3, moderate to severe emphysematous changes in the upper lobes bilaterally and concern for heterogeneous appearance in the right atrium.  The patient was started on a heparin drip.  She had increasing oxygen requirements with worsening shortness of breath and systolic blood pressure dropping into the 80s.  Blood pressure improved with a small fluid bolus.  She was transferred to [facility] for further evaluation for possible thrombolytic therapy.  The marrow was held in the setting of thromboembolism.  Subsequent echocardiogram showed a greater than 9 cm right atrial extending into the right ventricle thrombus.  She was treated with alteplase  in the setting of life-threatening pulmonary embolism with cardiac thrombus.  She tolerated therapy well without bleeding.  Patient was maintained on intravenous heparin and transitioned to Xarelto.  Prior to discharge she was assessed for oxygen needs and required 2 L to maintain saturations with exertion.  She was evaluated by physical therapy with recommendations for home oxygen only.  Patient was medically cleared 8/21 for discharge with plans as above.  Medications given to patient prior to discharge.    SIGNIFICANT DIAGNOSTIC STUDIES CTA PE 8/17 >> extensive bilateral pulmonary emboli, greatest on the right.  Positive for acute PE with CT evidence of right heart strain with RV/LV ratio of 1.3 consistent with at least submassive (intermediate risk) PE.  There are also moderate  severe emphysematous changes involving upper lobes bilaterally.  Cardiomegaly.  Aortic atherosclerosis.  MICRO DATA  COVID 8/17 >> positive   Discharge Exam: Telemetry exam as patient examined by attending MD. General: Elderly female sitting up in chair in no acute distress Neuro: Awake, alert, oriented CV: Sinus rhythm in the 80s on monitor PULM: Normal effort, nasal cannula O2, able to speak full sentences (observed on phone) GI: Tolerating PO's Extremities: No edema  Vitals:   09/03/18 0915 09/03/18 0930 09/03/18 0945 09/03/18 1100  BP:      Pulse: 84 88 77 91  Resp: (!) 25 18 (!) 27 (!) 28  Temp:      TempSrc:      SpO2: 96% 98% 99% 95%  Weight:      Height:         Discharge Labs  BMET Recent Labs  Lab 08/31/18 0322 08/31/18 1134 09/01/18 0437 09/02/18 0304 09/03/18 0218  NA 144 145 143 139 138  K 5.2* 5.3* 4.2 3.8 3.8  CL 114* 119* 115* 107 106  CO2 20* 12* 18* 23 25  GLUCOSE 163* 119* 127* 176* 188*  BUN 25* 27* '20 15 10  '$ CREATININE 1.44* 1.43* 0.87 0.97 0.82  CALCIUM 7.9* 8.1* 8.1* 8.2* 8.5*  MG 1.8  --  2.0 1.6* 1.7    CBC Recent Labs  Lab 09/01/18 0437 09/02/18 0304 09/03/18 0218  HGB 11.0* 10.2* 10.5*  HCT 34.2* 32.4* 33.8*  WBC 13.0* 8.8 9.7  PLT 145* 180 187    Anti-Coagulation Recent Labs  Lab 08/30/18 2355 08/31/18 1134  INR 1.4* 1.5*    Discharge Instructions    Call MD for:  difficulty breathing, headache or visual disturbances   Complete by: As directed    Call MD for:  extreme fatigue   Complete by: As directed    Call MD for:  hives   Complete by: As directed    Call MD for:  persistant dizziness or light-headedness   Complete by: As directed    Call MD for:  persistant nausea and vomiting   Complete by: As directed    Call MD for:  redness, tenderness, or signs of infection (pain, swelling, redness, odor or green/yellow discharge around incision site)   Complete by: As directed    Call MD for:  severe uncontrolled  pain   Complete by: As directed    Call MD for:  temperature >100.4   Complete by: As directed    Diet - low sodium heart healthy   Complete by: As directed    Discharge instructions   Complete by: As directed    1.  Review your medications carefully as they have changed.  2.  Stop taking your Femara until you  talk with Oncology  3.  Caution with activity that could result in bleeding as you may bleed more easily and longer on anticoagulation (blood thinner medications).  Hold pressure to any site that is bleeding.  If you have difficulties stopping bleeding, report to the ER. Be careful with kitchen prep / cutting, shaving, yard work, Social research officer, government.   4.  Report to the ER immediately if you have new or worsening symptoms.   Increase activity slowly   Complete by: As directed      Follow-up Information    Abran Richard, MD. Schedule an appointment as soon as possible for a visit.   Specialty: Internal Medicine Why: Call to make an appointment to be seen in one month Contact information: [address] [place] [address] [phone]            Allergies as of 09/03/2018      Reactions   Lisinopril Shortness Of Breath, Swelling, Cough      Medication List    STOP taking these medications   letrozole 2.5 MG tablet Commonly known as: [place] these medications   atorvastatin 40 MG tablet Commonly known as: LIPITOR Take 40 mg by mouth every morning.   glipiZIDE 10 MG tablet Commonly known as: GLUCOTROL Take 10 mg by mouth 2 (two) times daily before a meal.   metFORMIN 1000 MG tablet Commonly known as: GLUCOPHAGE Take 1,000 mg by mouth 2 (two) times daily.   metoprolol succinate 25 MG 24 hr tablet Commonly known as: TOPROL-XL Take 25 mg by mouth every morning.   Rivaroxaban 15 & 20 MG Tbpk Take as directed on package: Take with food   Tylenol 8 Hour Arthritis Pain 650 MG CR tablet Generic drug: acetaminophen Take 1,300 mg by mouth 2 (two) times  daily.            Durable Medical Equipment  (From admission, onward)         Start     Ordered   09/03/18 0904  DME Oxygen  Once    Question Answer Comment  Length of Need 6 Months   Mode or (Route) Nasal cannula   Liters per Minute 2   Frequency Continuous (stationary and portable oxygen unit needed)   Oxygen conserving device Yes   Oxygen delivery system Gas      09/03/18 0910            Disposition: Home.  Oxygen arranged for discharge.    Discharged Condition: Alexa Castillo has met maximum benefit of inpatient care and is medically stable and cleared for discharge.  Patient is pending follow up as above.      Time spent on disposition:  35 Minutes.   Signed: Noe Gens, NP-C [facility] Pgr: [phone] Office: [phone]

## 2018-09-03 NOTE — TOC Initial Note (Addendum)
Transition of Care [facility]) - Initial/Assessment Note    Patient Details  Name: Alexa Castillo MRN: [id number] Date of Birth: 05/19/1950  Transition of Care [facility]) CM/SW Contact:    Maryclare Labrador, RN Phone Number: 09/03/2018, 12:02 PM  Clinical Narrative:                 PTA independent from home.  Pt plans to stay with son immediatly post discharge and then later transition home.  Sons address is as follows:  [place] [address].  Pt will discharge home on home oxygen - CM offered choice - pt chose [facility] - agency contacted and referral accepted- agency informed of address at the time of discharge.  Pt will discharge home on Xarelto - CM request that prescriptions be sent to [place].    Update:  [facility] can not service pts home in [place].  CM gave referral to agency that can service both [place] and [place].  Agency accepted referral and informed of pts plan to discharge to home in [place] and then later transition back to [place] - address in [place] given to [place].  Agency aware that pt is COVID positive  Expected Discharge Plan: Home/Self Care     Patient Goals and CMS Choice Patient states their goals for this hospitalization and ongoing recovery are:: Pt states she is ready to get better at her husbands home so she can go back to her home independently CMS Medicare.gov Compare Post Acute Care list provided to:: Patient Choice offered to / list presented to : Patient  Expected Discharge Plan and Services Expected Discharge Plan: Home/Self Care     Post Acute Care Choice: Durable Medical Equipment Living arrangements for the past 2 months: Single Family Home Expected Discharge Date: 09/03/18               DME Arranged: Oxygen DME Agency: [facility] Date DME Agency Contacted: 09/03/18 Time DME Agency Contacted: 1200 Representative spoke with at DME Agency: Gwinda Passe            Prior Living Arrangements/Services Living arrangements for the past 2  months: [place] with:: Self Patient language and need for interpreter reviewed:: Yes Do you feel safe going back to the place where you live?: Yes      Need for Family Participation in Patient Care: No (Comment) Care giver support system in place?: Yes (comment)   Criminal Activity/Legal Involvement Pertinent to Current Situation/Hospitalization: No - Comment as needed  Activities of Daily Living Home Assistive Devices/Equipment: None ADL Screening (condition at time of admission) Patient's cognitive ability adequate to safely complete daily activities?: Yes Is the patient deaf or have difficulty hearing?: No Does the patient have difficulty seeing, even when wearing glasses/contacts?: No Does the patient have difficulty concentrating, remembering, or making decisions?: No Patient able to express need for assistance with ADLs?: Yes Does the patient have difficulty dressing or bathing?: No Independently performs ADLs?: No Communication: Independent Dressing (OT): Needs assistance Is this a change from baseline?: Change from baseline, expected to last <3days Grooming: Independent Feeding: Independent Bathing: Needs assistance Is this a change from baseline?: Change from baseline, expected to last <3 days Toileting: Needs assistance Is this a change from baseline?: Change from baseline, expected to last <3 days In/Out Bed: Needs assistance Is this a change from baseline?: Change from baseline, expected to last <3 days Walks in Home: Independent Does the patient have difficulty walking or climbing stairs?: Yes Weakness  of Legs: None Weakness of Arms/Hands: None  Permission Sought/Granted   Permission granted to share information with : Yes, Verbal Permission Granted     Permission granted to share info w AGENCY: [facility]        Emotional Assessment   Attitude/Demeanor/Rapport: Self-Confident, Engaged Affect (typically observed): Accepting,  [facility] Orientation: : Oriented to Self, Oriented to Place, Oriented to  Time, Oriented to Situation      Admission diagnosis:  Acute respiratory failure with hypoxia ([place]) [J96.01] COVID-19 virus detected [U07.1] Acute pulmonary embolism ([place]) [I26.99] Patient Active Problem List   Diagnosis Date Noted  . Acute respiratory failure with hypoxia ([place])   . COVID-19 virus infection 08/30/2018  . Diabetes mellitus type II, non insulin dependent ([place]) 08/30/2018  . AKI (acute kidney injury) ([place]) 08/30/2018  . Prolonged QT interval 08/30/2018  . Chronic joint pain 10/16/2015  . Acute pulmonary embolism ([place]) 01/15/2015  . Chest pain on breathing 01/15/2015  . Dyspnea on exertion 01/15/2015  . Troponin level elevated 01/15/2015  . Arthritis   . Hypertension   . Essential hypertension   . Pulmonary embolism ([place])   . Obstructive sleep apnea syndrome in adult 07/20/2013  . Weakness of right leg 03/11/2011  . Left leg weakness 03/11/2011  . Osteoarthritis of hip 03/08/2011  . DDD (degenerative disc disease), lumbar 03/08/2011  . Right breast cancer [id number] ER Positive 2012 07/18/2010   PCP:  Abran Richard, MD Pharmacy:   [facility] [address] [place] - [id number] [place] [address] Phone: [phone] Fax: [phone]  [id number] - [place], [place] [address] Phone: [phone] Fax: [phone]  [place], [place] - [address] [place] [address] Phone: [phone] Fax: [phone] - [place], [place] [address] Phone: [phone] Fax: [phone]  [facility] - [place], [place] [address] Phone: [phone] Fax: [phone]  [facility] [place], [place] - [address] [place] [address] Phone: [phone] Fax: [phone]     Social Determinants of Health (SDOH) Interventions    Readmission Risk Interventions No flowsheet data found.

## 2018-09-04 LAB — GLUCOSE, CAPILLARY: Glucose-Capillary: 177 mg/dL — ABNORMAL HIGH (ref 70–99)

## 2018-09-05 LAB — CULTURE, BLOOD (ROUTINE X 2)
Culture: NO GROWTH
Culture: NO GROWTH

## 2018-09-07 ENCOUNTER — Encounter (HOSPITAL_COMMUNITY): Payer: Self-pay | Admitting: *Deleted

## 2018-09-07 NOTE — Progress Notes (Signed)
Patient called clinic following a recent discharge from the hospital for blood clots.  She said she was instructed to contact her medical oncologist to be sure she can continue taking her letrozole with the new medication Xarelto.  I have spoken with Francene Finders, NP and she advised that patient can continue taking both.  We will follow up with patient as scheduled.    I returned the call to Alexa Castillo and advised of the above.  She verbalizes understanding.

## 2018-09-16 ENCOUNTER — Other Ambulatory Visit: Payer: Self-pay | Admitting: *Deleted

## 2018-09-16 DIAGNOSIS — Z20822 Contact with and (suspected) exposure to covid-19: Secondary | ICD-10-CM

## 2018-09-17 LAB — NOVEL CORONAVIRUS, NAA: SARS-CoV-2, NAA: NOT DETECTED

## 2018-10-02 ENCOUNTER — Emergency Department (HOSPITAL_COMMUNITY): Payer: Medicare PPO

## 2018-10-02 ENCOUNTER — Emergency Department (HOSPITAL_COMMUNITY)
Admission: EM | Admit: 2018-10-02 | Discharge: 2018-10-02 | Disposition: A | Discharge status: Home / Self Care | Payer: Medicare PPO | Attending: Emergency Medicine | Admitting: Emergency Medicine

## 2018-10-02 ENCOUNTER — Other Ambulatory Visit: Payer: Self-pay

## 2018-10-02 ENCOUNTER — Encounter (HOSPITAL_COMMUNITY): Payer: Self-pay

## 2018-10-02 DIAGNOSIS — Z853 Personal history of malignant neoplasm of breast: Secondary | ICD-10-CM | POA: Diagnosis not present

## 2018-10-02 DIAGNOSIS — I10 Essential (primary) hypertension: Secondary | ICD-10-CM | POA: Insufficient documentation

## 2018-10-02 DIAGNOSIS — Z87891 Personal history of nicotine dependence: Secondary | ICD-10-CM | POA: Diagnosis not present

## 2018-10-02 DIAGNOSIS — R221 Localized swelling, mass and lump, neck: Secondary | ICD-10-CM | POA: Insufficient documentation

## 2018-10-02 DIAGNOSIS — Z79899 Other long term (current) drug therapy: Secondary | ICD-10-CM | POA: Insufficient documentation

## 2018-10-02 DIAGNOSIS — L509 Urticaria, unspecified: Secondary | ICD-10-CM | POA: Diagnosis not present

## 2018-10-02 DIAGNOSIS — Z7984 Long term (current) use of oral hypoglycemic drugs: Secondary | ICD-10-CM | POA: Insufficient documentation

## 2018-10-02 DIAGNOSIS — R21 Rash and other nonspecific skin eruption: Secondary | ICD-10-CM | POA: Diagnosis present

## 2018-10-02 DIAGNOSIS — E119 Type 2 diabetes mellitus without complications: Secondary | ICD-10-CM | POA: Diagnosis not present

## 2018-10-02 DIAGNOSIS — L299 Pruritus, unspecified: Secondary | ICD-10-CM | POA: Insufficient documentation

## 2018-10-02 LAB — CBC WITH DIFFERENTIAL/PLATELET
Abs Immature Granulocytes: 0.03 10*3/uL (ref 0.00–0.07)
Basophils Absolute: 0 10*3/uL (ref 0.0–0.1)
Basophils Relative: 0 %
Eosinophils Absolute: 0.1 10*3/uL (ref 0.0–0.5)
Eosinophils Relative: 1 %
HCT: 40.5 % (ref 36.0–46.0)
Hemoglobin: 12.5 g/dL (ref 12.0–15.0)
Immature Granulocytes: 0 %
Lymphocytes Relative: 15 %
Lymphs Abs: 1.5 10*3/uL (ref 0.7–4.0)
MCH: 29.3 pg (ref 26.0–34.0)
MCHC: 30.9 g/dL (ref 30.0–36.0)
MCV: 94.8 fL (ref 80.0–100.0)
Monocytes Absolute: 0.4 10*3/uL (ref 0.1–1.0)
Monocytes Relative: 4 %
Neutro Abs: 7.6 10*3/uL (ref 1.7–7.7)
Neutrophils Relative %: 80 %
Platelets: 317 10*3/uL (ref 150–400)
RBC: 4.27 MIL/uL (ref 3.87–5.11)
RDW: 14.9 % (ref 11.5–15.5)
WBC: 9.6 10*3/uL (ref 4.0–10.5)
nRBC: 0 % (ref 0.0–0.2)

## 2018-10-02 LAB — COMPREHENSIVE METABOLIC PANEL
ALT: 19 U/L (ref 0–44)
AST: 18 U/L (ref 15–41)
Albumin: 3.5 g/dL (ref 3.5–5.0)
Alkaline Phosphatase: 72 U/L (ref 38–126)
Anion gap: 8 (ref 5–15)
BUN: 9 mg/dL (ref 8–23)
CO2: 26 mmol/L (ref 22–32)
Calcium: 9.3 mg/dL (ref 8.9–10.3)
Chloride: 106 mmol/L (ref 98–111)
Creatinine, Ser: 0.69 mg/dL (ref 0.44–1.00)
GFR calc Af Amer: >60 mL/min (ref >60)
GFR calc non Af Amer: >60 mL/min (ref >60)
Glucose, Bld: 127 mg/dL — ABNORMAL HIGH (ref 70–99)
Potassium: 4.3 mmol/L (ref 3.5–5.1)
Sodium: 140 mmol/L (ref 135–145)
Total Bilirubin: 0.4 mg/dL (ref 0.3–1.2)
Total Protein: 7.1 g/dL (ref 6.5–8.1)

## 2018-10-02 LAB — GROUP A STREP BY PCR: Group A Strep by PCR: NOT DETECTED

## 2018-10-02 MED ORDER — FAMOTIDINE IN NACL 20-0.9 MG/50ML-% IV SOLN
20.0000 mg | Freq: Once | INTRAVENOUS | Status: AC
  Administered 2018-10-02: 20 mg via INTRAVENOUS
  Filled 2018-10-02: qty 50

## 2018-10-02 MED ORDER — PREDNISONE 20 MG PO TABS: ORAL_TABLET | ORAL | 0 refills | Status: DC

## 2018-10-02 MED ORDER — DIPHENHYDRAMINE HCL 50 MG/ML IJ SOLN
25.0000 mg | Freq: Once | INTRAMUSCULAR | Status: AC
  Administered 2018-10-02: 25 mg via INTRAVENOUS
  Filled 2018-10-02: qty 1

## 2018-10-02 MED ORDER — METHYLPREDNISOLONE SODIUM SUCC 125 MG IJ SOLR
125.0000 mg | Freq: Once | INTRAMUSCULAR | Status: AC
  Administered 2018-10-02: 07:00:00 125 mg via INTRAVENOUS
  Filled 2018-10-02: qty 2

## 2018-10-02 MED ORDER — IOHEXOL 300 MG/ML  SOLN
75.0000 mL | Freq: Once | INTRAMUSCULAR | Status: AC | PRN
  Administered 2018-10-02: 75 mL via INTRAVENOUS

## 2018-10-02 NOTE — ED Triage Notes (Signed)
Pt states her throat started feeling like it was swelling last night and is a bit worse today.  Pt denies diff breathing or swallowing.  Pt's tonsils appear swollen, but she denies sore throat.

## 2018-10-02 NOTE — ED Provider Notes (Addendum)
[facility] EMERGENCY DEPARTMENT Provider Note   CSN: [id number] Arrival date & time: 10/02/18  0606   Time seen 6:14 AM  History   Chief Complaint Chief Complaint  Patient presents with  . Allergic Reaction    HPI Alexa Castillo is a 68 y.o. female.     HPI patient states she was admitted to the hospital from August 17 for 3 days when she became COVID positive and had to be put on oxygen which she now only uses at nighttime.  She also was diagnosed of having blood clots and has been on Xarelto.  She states she has been having itching of her skin mainly her left upper extremity and getting some hive-like rash.  This morning she woke up about 330 and felt like the right side of her throat was swollen.  She thought maybe her lips were swollen but could not see any obvious swelling.  She denies any swelling of her tongue.  She denies any throat pain.  She states she is swallowing fine.  She denies having any trouble breathing.  She indicates that feeling is high up in her throat not in the front around her vocal box.  She denies being around anybody sick.  She denies having this problem before.  She states the only new medication she is on is the Xarelto.  She states her recent COVID test has turned negative.  PCP Abran Richard, MD   Past Medical History:  Diagnosis Date  . Arthritis    back/saw Dr. Aline Brochure  . Breast cancer ([place])   . Cancer (HCC)    breast  . Diabetes mellitus   . Emphysema lung ([place])   . Hypertension   . Low back pain   . Obesity   . Pulmonary embolism [facility])    January 2017, August 2020  . Sleep apnea 04/2013    Patient Active Problem List   Diagnosis Date Noted  . Acute respiratory failure with hypoxia ([place])   . COVID-19 virus infection 08/30/2018  . Diabetes mellitus type II, non insulin dependent ([place]) 08/30/2018  . AKI (acute kidney injury) ([place]) 08/30/2018  . Prolonged QT interval 08/30/2018  . Chronic joint pain 10/16/2015  . Acute pulmonary  embolism ([place]) 01/15/2015  . Chest pain on breathing 01/15/2015  . Dyspnea on exertion 01/15/2015  . Troponin level elevated 01/15/2015  . Arthritis   . Hypertension   . Essential hypertension   . Pulmonary embolism ([place])   . Obstructive sleep apnea syndrome in adult 07/20/2013  . Weakness of right leg 03/11/2011  . Left leg weakness 03/11/2011  . Osteoarthritis of hip 03/08/2011  . DDD (degenerative disc disease), lumbar 03/08/2011  . Right breast cancer T1bN0M0 ER Positive 2012 07/18/2010    Past Surgical History:  Procedure Laterality Date  . BIOPSY BREAST    . TUBAL LIGATION       OB History    Gravida  3   Para  2   Term  2   Preterm      AB  1   Living  2     SAB  1   TAB      Ectopic      Multiple      Live Births               Home Medications    Prior to Admission medications   Medication Sig Start Date End Date Taking? Authorizing Provider  acetaminophen (TYLENOL 8 HOUR ARTHRITIS PAIN)  650 MG CR tablet Take 1,300 mg by mouth 2 (two) times daily.    [provider]  atorvastatin (LIPITOR) 40 MG tablet Take 40 mg by mouth every morning.  12/05/16   [provider]  glipiZIDE (GLUCOTROL) 10 MG tablet Take 10 mg by mouth 2 (two) times daily before a meal.  06/18/10   [provider]  metFORMIN (GLUCOPHAGE) 1000 MG tablet Take 1,000 mg by mouth 2 (two) times daily. 06/20/14   [provider]  metoprolol succinate (TOPROL-XL) 25 MG 24 hr tablet Take 25 mg by mouth every morning.  06/20/14   [provider]  Rivaroxaban 15 & 20 MG TBPK Take as directed on package: Take with food 09/03/18   Candee Furbish, MD    Family History Family History  Problem Relation Age of Onset  . Liver cancer Mother   . Cancer Mother   . Diabetes Mother   . Breast cancer Sister   . Cancer Sister     Social History Social History   Tobacco Use  . Smoking status: Former Smoker    Packs/day: 1.50    Years: 17.00    Pack  years: 25.50    Types: Cigarettes    Quit date: 12/18/1994    Years since quitting: 23.8  . Smokeless tobacco: Never Used  Substance Use Topics  . Alcohol use: No  . Drug use: No     Allergies   Lisinopril   Review of Systems Review of Systems  All other systems reviewed and are negative.    Physical Exam Updated Vital Signs BP (!) 155/93 (BP Location: Right Arm)   Pulse (!) 101   Temp 98.3 F (36.8 C) (Oral)   Resp 20   Ht 5\' 2"  (1.575 m)   Wt 102.1 kg   SpO2 94%   BMI 41.15 kg/m   Physical Exam Vitals signs and nursing note reviewed.  Constitutional:      General: She is not in acute distress.    Appearance: Normal appearance. She is well-developed. She is not ill-appearing or toxic-appearing.  HENT:     Head: Normocephalic and atraumatic.     Right Ear: External ear normal.     Left Ear: External ear normal.     Nose: Nose normal. No mucosal edema or rhinorrhea.     Mouth/Throat:     Dentition: No dental abscesses.     Pharynx: No uvula swelling.     Comments: There appears to be some mild swelling of her right lower lip.  Patient's right tonsil is enlarged compared to the left.  The uvula is midline and there is no soft palate swelling seen.  Her voice seems normal.  She is not having any difficulty swallowing or breathing. Eyes:     Conjunctiva/sclera: Conjunctivae normal.     Pupils: Pupils are equal, round, and reactive to light.  Neck:     Musculoskeletal: Full passive range of motion without pain, normal range of motion and neck supple.  Cardiovascular:     Rate and Rhythm: Normal rate and regular rhythm.     Heart sounds: Normal heart sounds. No murmur. No friction rub. No gallop.   Pulmonary:     Effort: Pulmonary effort is normal. No respiratory distress.     Breath sounds: Normal breath sounds. No wheezing, rhonchi or rales.  Chest:     Chest wall: No tenderness or crepitus.  Musculoskeletal: Normal range of motion.  General: No  tenderness.     Comments: Moves all extremities well.   Skin:    General: Skin is warm and dry.     Coloration: Skin is not pale.     Findings: Rash present. No erythema.     Comments: Patient has some small urticarial type lesions on her left upper arm.  Neurological:     General: No focal deficit present.     Mental Status: She is alert and oriented to person, place, and time.     Cranial Nerves: No cranial nerve deficit.  Psychiatric:        Mood and Affect: Mood normal. Mood is not anxious.        Speech: Speech normal.        Behavior: Behavior normal.        Thought Content: Thought content normal.      ED Treatments / Results  Labs (all labs ordered are listed, but only abnormal results are displayed) Results for orders placed or performed in visit on 09/16/18  Novel Coronavirus, NAA ([facility])   Specimen: Oropharyngeal(OP) collection in vial transport medium   OROPHARYNGEA  TESTING  Result Value Ref Range   SARS-CoV-2, NAA Not Detected Not Detected   Labs pending   EKG EKG Interpretation  Date/Time:  Saturday October 02 2018 07:03:07 EDT Ventricular Rate:  95 PR Interval:    QRS Duration: 96 QT Interval:  382 QTC Calculation: 481 R Axis:   -24 Text Interpretation:  Sinus rhythm Borderline left axis deviation Low voltage, precordial leads Borderline T abnormalities, anterior leads Since last tracing rate faster Confirmed by Rolland Porter [id number]) on 10/02/2018 7:09:37 AM   Radiology No results found.  Procedures Procedures (including critical care time)  Medications Ordered in ED Medications  diphenhydrAMINE (BENADRYL) injection 25 mg (has no administration in time range)  methylPREDNISolone sodium succinate (SOLU-MEDROL) 125 mg/2 mL injection 125 mg (has no administration in time range)  famotidine (PEPCID) IVPB 20 mg premix (has no administration in time range)     Initial Impression / Assessment and Plan / ED Course  I have reviewed the triage vital  signs and the nursing notes.  Pertinent labs & imaging results that were available during my care of the patient were reviewed by me and considered in my medical decision making (see chart for details).       Patient was treated with IV steroids, IV Benadryl and Pepcid.  She has some asymmetry of her tonsils.  Rapid strep was done although she states her throat is not painful.  CT of the soft tissue neck was done to make sure there is nothing underlying it such as a mass or tumor.  Patient turned over to Dr. Roderic Palau at the change of shift to get the results of her blood work and her CT scan.  Final Clinical Impressions(s) / ED Diagnoses   Final diagnoses:  Throat swelling  Urticarial rash  Pruritus    Disposition pending   Rolland Porter, MD 10/02/18 [id number]    Rolland Porter, MD 10/02/18 [phone]

## 2018-10-02 NOTE — Discharge Instructions (Addendum)
Follow-up with Dr. Redmond Baseman or 1 of his partners in the next week.  Call the office on Monday and tell them you are in the emergency department and we felt like you should be seen this week

## 2018-10-02 NOTE — ED Notes (Signed)
EKG done and given to Dr Tomi Bamberger

## 2018-10-05 ENCOUNTER — Other Ambulatory Visit (HOSPITAL_COMMUNITY): Payer: Self-pay | Admitting: Otolaryngology

## 2018-10-05 DIAGNOSIS — K118 Other diseases of salivary glands: Secondary | ICD-10-CM

## 2018-10-06 ENCOUNTER — Other Ambulatory Visit: Payer: Self-pay

## 2018-10-06 DIAGNOSIS — Z20822 Contact with and (suspected) exposure to covid-19: Secondary | ICD-10-CM

## 2018-10-08 LAB — NOVEL CORONAVIRUS, NAA: SARS-CoV-2, NAA: NOT DETECTED

## 2018-10-21 ENCOUNTER — Encounter (HOSPITAL_COMMUNITY): Payer: Self-pay | Admitting: Radiology

## 2018-10-21 ENCOUNTER — Other Ambulatory Visit (HOSPITAL_COMMUNITY): Payer: Self-pay | Admitting: *Deleted

## 2018-10-21 DIAGNOSIS — K118 Other diseases of salivary glands: Secondary | ICD-10-CM

## 2018-10-21 MED ORDER — ENOXAPARIN SODIUM 150 MG/ML ~~LOC~~ SOLN: 150.0000 mg | Freq: Once | SUBCUTANEOUS | 0 refills | Status: DC

## 2018-10-21 NOTE — Progress Notes (Signed)
Orders received for Lovenox 1.5mg /kg.  Patient weighs 102.1kg per her last documented weight in chart.  Lovenox 150mg /68ml called in to pharmacy.  Patient was notified of biopsy and that IR will call her with the appointment date and time.  She is to hold her xarelto the day before the biopsy and take the lovenox the day before the biopsy.  She is to hold the xarelto the day of the biopsy.  She is okay to resume the xarelto the day after biopsy.  She was given theses instructions and verbalizes understanding.

## 2018-10-21 NOTE — Progress Notes (Unsigned)
Alexa Castillo Female, 69 y.o., 02-21-1950 MRN:  [id number] Phone:  [phone] Jerilynn Mages) PCP:  Abran Richard, MD Coverage:  [facility] Medicare/[employer] Medicare Choice Ppo Next Appt With Radiology ([facility]-US 2) 11/04/2018 at 1:00 PM  RE: [place] Core Biopsy (salivary gland/parotid gland) Received: 2 weeks ago Message Contents  Arne Cleveland, MD  Arlyn Leak   [place] core R parotid lesion    [facility]   Previous Messages  ----- Message -----  From: Garth Bigness D  Sent: 10/06/2018  9:26 AM EDT  To: Ir Procedure Requests  Subject: [place] Core Biopsy (salivary gland/parotid gland)   Procedure:  [place] CORE BIOPSY (SALIVARY GLAND/PAROTID GLAND)   Reason:  Mass of right parotid gland   History: CT in computer   Dr. Blenda Nicely, [place]  [phone]      Duplicate order was placed by Katragadda on 10/21/2018 for same diagnosis.

## 2018-11-03 ENCOUNTER — Other Ambulatory Visit: Payer: Self-pay | Admitting: Student

## 2018-11-03 ENCOUNTER — Other Ambulatory Visit: Payer: Self-pay | Admitting: Radiology

## 2018-11-04 ENCOUNTER — Other Ambulatory Visit (HOSPITAL_COMMUNITY): Payer: Self-pay | Admitting: Hematology

## 2018-11-04 ENCOUNTER — Other Ambulatory Visit: Payer: Self-pay

## 2018-11-04 ENCOUNTER — Ambulatory Visit (HOSPITAL_COMMUNITY)
Admission: RE | Admit: 2018-11-04 | Discharge: 2018-11-04 | Disposition: A | Discharge status: Home / Self Care | Payer: Medicare PPO | Source: Ambulatory Visit | Attending: Hematology | Admitting: Hematology

## 2018-11-04 DIAGNOSIS — Z803 Family history of malignant neoplasm of breast: Secondary | ICD-10-CM | POA: Diagnosis not present

## 2018-11-04 DIAGNOSIS — I10 Essential (primary) hypertension: Secondary | ICD-10-CM | POA: Diagnosis not present

## 2018-11-04 DIAGNOSIS — Z833 Family history of diabetes mellitus: Secondary | ICD-10-CM | POA: Insufficient documentation

## 2018-11-04 DIAGNOSIS — Z86711 Personal history of pulmonary embolism: Secondary | ICD-10-CM | POA: Diagnosis not present

## 2018-11-04 DIAGNOSIS — Z7901 Long term (current) use of anticoagulants: Secondary | ICD-10-CM | POA: Insufficient documentation

## 2018-11-04 DIAGNOSIS — Z87891 Personal history of nicotine dependence: Secondary | ICD-10-CM | POA: Diagnosis not present

## 2018-11-04 DIAGNOSIS — K118 Other diseases of salivary glands: Secondary | ICD-10-CM | POA: Diagnosis present

## 2018-11-04 DIAGNOSIS — Z7984 Long term (current) use of oral hypoglycemic drugs: Secondary | ICD-10-CM | POA: Insufficient documentation

## 2018-11-04 DIAGNOSIS — Z888 Allergy status to other drugs, medicaments and biological substances status: Secondary | ICD-10-CM | POA: Diagnosis not present

## 2018-11-04 DIAGNOSIS — E119 Type 2 diabetes mellitus without complications: Secondary | ICD-10-CM | POA: Insufficient documentation

## 2018-11-04 DIAGNOSIS — E669 Obesity, unspecified: Secondary | ICD-10-CM | POA: Insufficient documentation

## 2018-11-04 DIAGNOSIS — Z79899 Other long term (current) drug therapy: Secondary | ICD-10-CM | POA: Insufficient documentation

## 2018-11-04 DIAGNOSIS — J439 Emphysema, unspecified: Secondary | ICD-10-CM | POA: Diagnosis not present

## 2018-11-04 DIAGNOSIS — Z6841 Body Mass Index (BMI) 40.0 and over, adult: Secondary | ICD-10-CM | POA: Insufficient documentation

## 2018-11-04 DIAGNOSIS — G4730 Sleep apnea, unspecified: Secondary | ICD-10-CM | POA: Insufficient documentation

## 2018-11-04 DIAGNOSIS — M1990 Unspecified osteoarthritis, unspecified site: Secondary | ICD-10-CM | POA: Insufficient documentation

## 2018-11-04 DIAGNOSIS — Z800 Family history of malignant neoplasm of digestive organs: Secondary | ICD-10-CM | POA: Insufficient documentation

## 2018-11-04 DIAGNOSIS — Z853 Personal history of malignant neoplasm of breast: Secondary | ICD-10-CM | POA: Insufficient documentation

## 2018-11-04 LAB — CBC
HCT: 41.5 % (ref 36.0–46.0)
Hemoglobin: 13.1 g/dL (ref 12.0–15.0)
MCH: 29 pg (ref 26.0–34.0)
MCHC: 31.6 g/dL (ref 30.0–36.0)
MCV: 91.8 fL (ref 80.0–100.0)
Platelets: 353 10*3/uL (ref 150–400)
RBC: 4.52 MIL/uL (ref 3.87–5.11)
RDW: 14.7 % (ref 11.5–15.5)
WBC: 12 10*3/uL — ABNORMAL HIGH (ref 4.0–10.5)
nRBC: 0 % (ref 0.0–0.2)

## 2018-11-04 LAB — GLUCOSE, CAPILLARY
Glucose-Capillary: 69 mg/dL — ABNORMAL LOW (ref 70–99)
Glucose-Capillary: 135 mg/dL — ABNORMAL HIGH (ref 70–99)

## 2018-11-04 LAB — PROTIME-INR
INR: 1 (ref 0.8–1.2)
Prothrombin Time: 13 seconds (ref 11.4–15.2)

## 2018-11-04 LAB — APTT: aPTT: 29 seconds (ref 24–36)

## 2018-11-04 MED ORDER — FENTANYL CITRATE (PF) 100 MCG/2ML IJ SOLN
INTRAMUSCULAR | Status: AC | PRN
  Administered 2018-11-04: 25 ug via INTRAVENOUS

## 2018-11-04 MED ORDER — DEXTROSE 50 % IV SOLN
INTRAVENOUS | Status: AC
  Administered 2018-11-04: 12:00:00 25 mL
  Filled 2018-11-04: qty 50

## 2018-11-04 MED ORDER — MIDAZOLAM HCL 2 MG/2ML IJ SOLN
INTRAMUSCULAR | Status: AC
  Filled 2018-11-04: qty 2

## 2018-11-04 MED ORDER — DEXTROSE 50 % IV SOLN: 25.0000 mL | Freq: Once | INTRAVENOUS | Status: DC

## 2018-11-04 MED ORDER — FENTANYL CITRATE (PF) 100 MCG/2ML IJ SOLN
INTRAMUSCULAR | Status: AC
  Filled 2018-11-04: qty 2

## 2018-11-04 MED ORDER — MIDAZOLAM HCL 2 MG/2ML IJ SOLN
INTRAMUSCULAR | Status: AC | PRN
  Administered 2018-11-04: 1 mg via INTRAVENOUS

## 2018-11-04 MED ORDER — LIDOCAINE HCL (PF) 1 % IJ SOLN
INTRAMUSCULAR | Status: AC
  Filled 2018-11-04: qty 30

## 2018-11-04 MED ORDER — SODIUM CHLORIDE 0.9 % IV SOLN: INTRAVENOUS | Status: DC

## 2018-11-04 NOTE — Procedures (Signed)
Rt parotid tail lesion  S/p US aspiration of the rt parotid cystic lesion  4 cc serosang cloudly fld aspirated  cyto and cx sent  No comp Stable Full report in pacs

## 2018-11-04 NOTE — Discharge Instructions (Signed)
Needle Biopsy, Care After °These instructions tell you how to care for yourself after your procedure. Your doctor may also give you more specific instructions. Call your doctor if you have any problems or questions. °What can I expect after the procedure? °After the procedure, it is common to have: °· Soreness. °· Bruising. °· Mild pain. °Follow these instructions at home: ° °· Return to your normal activities as told by your doctor. Ask your doctor what activities are safe for you. °· Take over-the-counter and prescription medicines only as told by your doctor. °· Wash your hands with soap and water before you change your bandage (dressing). If you cannot use soap and water, use hand sanitizer. °· Follow instructions from your doctor about: °? How to take care of your puncture site. °? When and how to change your bandage. °? When to remove your bandage. °· Check your puncture site every day for signs of infection. Watch for: °? Redness, swelling, or pain. °? Fluid or blood.  °? Pus or a bad smell. °? Warmth. °· Do not take baths, swim, or use a hot tub until your doctor approves. Ask your doctor if you may take showers. You may only be allowed to take sponge baths. °· Keep all follow-up visits as told by your doctor. This is important. °Contact a doctor if you have: °· A fever. °· Redness, swelling, or pain at the puncture site, and it lasts longer than a few days. °· Fluid, blood, or pus coming from the puncture site. °· Warmth coming from the puncture site. °Get help right away if: °· You have a lot of bleeding from the puncture site. °Summary °· After the procedure, it is common to have soreness, bruising, or mild pain at the puncture site. °· Check your puncture site every day for signs of infection, such as redness, swelling, or pain. °· Get help right away if you have severe bleeding from your puncture site. °This information is not intended to replace advice given to you by your health care provider. Make  sure you discuss any questions you have with your health care provider. °Document Released: 12/13/2007 Document Revised: 01/12/2017 Document Reviewed: 01/12/2017 °Elsevier Patient Education © 2020 Elsevier Inc. ° °

## 2018-11-04 NOTE — H&P (Signed)
Chief Complaint: Patient was seen in consultation today for right parotid mass biopsy.  Referring Physician(s): Firefighter  Supervising Physician: Daryll Brod  Patient Status: [facility] - Out-pt  History of Present Illness: Alexa Castillo is a 68 y.o. female with a past medical history significant for obesity, arthritis, low back pain, emphysema, breast cancer, HTN, recent PE currently on lovenox + Xarelto and DM who presents today for a biopsy of a right parotid mass. Ms. Birth was admitted to the hospital from 8/17 - 8/21 due to bilateral PE with evidence of right heart strain and positive COVID test. She again presented to [facility] ED on 9/19 due to complaints pruritis, hives and feeling of her throat being swollen. CT soft tissue neck with contrast was performed which showed a right greater than left palatine tonsillar enlargement that was previously seen on 2014 imaging, however it had appeared to enlarge as well as new symmetric lingual tonsillar enlargement. Additionally noted as a 2.7 cm mass near the right parotid tail, larger than previously seen in 2014. IR has been asked to biopsy this parotid mass to further guide management.  Alexa Castillo states she has been feeling well besides her chronic back pain, she is still using oxygen occasionally at night but does not need it during the day. She has noticed the swelling on the side of her face for several years but was previously told it was nothing to worry about, the swelling is not painful and she has never had a rash or open wound over the swelling. She states that she has been taking her Xarelto and Lovenox without issue - her last dose of Xarelto was 2 days ago and her last dose of Lovenox was yesterday morning. She states understanding of requested procedure and wishes to proceed.  Past Medical History:  Diagnosis Date  . Arthritis    back/saw Dr. Aline Brochure  . Breast cancer ([place])   . Cancer (HCC)    breast  . Diabetes mellitus    . Emphysema lung ([place])   . Hypertension   . Low back pain   . Obesity   . Pulmonary embolism [facility])    January 2017, August 2020  . Sleep apnea 04/2013    Past Surgical History:  Procedure Laterality Date  . BIOPSY BREAST    . TUBAL LIGATION      Allergies: Lisinopril  Medications: Prior to Admission medications   Medication Sig Start Date End Date Taking? Authorizing Provider  acetaminophen (TYLENOL 8 HOUR ARTHRITIS PAIN) 650 MG CR tablet Take 1,300 mg by mouth 2 (two) times daily.   Yes [provider]  atorvastatin (LIPITOR) 40 MG tablet Take 40 mg by mouth every morning.  12/05/16  Yes [provider]  glipiZIDE (GLUCOTROL) 10 MG tablet Take 10 mg by mouth 2 (two) times daily before a meal.  06/18/10  Yes [provider]  metFORMIN (GLUCOPHAGE) 1000 MG tablet Take 1,000 mg by mouth 2 (two) times daily. 06/20/14  Yes [provider]  metoprolol succinate (TOPROL-XL) 25 MG 24 hr tablet Take 25 mg by mouth every morning.  06/20/14  Yes [provider]  Rivaroxaban (XARELTO) 15 MG TABS tablet Take 15 mg by mouth daily with supper.   Yes [provider]     Family History  Problem Relation Age of Onset  . Liver cancer Mother   . Cancer Mother   . Diabetes Mother   . Breast cancer Sister   . Cancer Sister  Social History   Socioeconomic History  . Marital status: Single    Spouse name: Not on file  . Number of children: Not on file  . Years of education: Not on file  . Highest education level: Not on file  Occupational History  . Not on file  Social Needs  . Financial resource strain: Not on file  . Food insecurity    Worry: Not on file    Inability: Not on file  . Transportation needs    Medical: Not on file    Non-medical: Not on file  Tobacco Use  . Smoking status: Former Smoker    Packs/day: 1.50    Years: 17.00    Pack years: 25.50    Types: Cigarettes    Quit date: 12/18/1994    Years since quitting:  23.8  . Smokeless tobacco: Never Used  Substance and Sexual Activity  . Alcohol use: No  . Drug use: No  . Sexual activity: Not on file  Lifestyle  . Physical activity    Days per week: Not on file    Minutes per session: Not on file  . Stress: Not on file  Relationships  . Social Herbalist on phone: Not on file    Gets together: Not on file    Attends religious service: Not on file    Active member of club or organization: Not on file    Attends meetings of clubs or organizations: Not on file    Relationship status: Not on file  Other Topics Concern  . Not on file  Social History Narrative  . Not on file     Review of Systems: A 12 point ROS discussed and pertinent positives are indicated in the HPI above.  All other systems are negative.  Review of Systems  Constitutional: Negative for chills and fever.  HENT:       (+) painless, right sided facial swelling  Respiratory: Negative for cough and shortness of breath.   Cardiovascular: Negative for chest pain.  Gastrointestinal: Negative for abdominal pain, diarrhea, nausea and vomiting.  Musculoskeletal: Positive for back pain.  Skin: Negative for rash and wound.  Neurological: Negative for dizziness and headaches.    Vital Signs: BP (!) 151/85   Pulse 97   Temp 99 F (37.2 C) (Oral)   Resp 16   Ht 5\' 2"  (1.575 m)   Wt 225 lb (102.1 kg)   SpO2 97%   BMI 41.15 kg/m   Physical Exam Vitals signs reviewed.  Constitutional:      General: She is not in acute distress. HENT:     Head: Normocephalic.     Comments: (+) painless, firm swelling over right parotid gland extending down to neck.     Mouth/Throat:     Mouth: Mucous membranes are moist.     Pharynx: Oropharynx is clear. No oropharyngeal exudate or posterior oropharyngeal erythema.  Cardiovascular:     Rate and Rhythm: Normal rate and regular rhythm.  Pulmonary:     Effort: Pulmonary effort is normal.     Breath sounds: Normal breath sounds.   Abdominal:     General: Bowel sounds are normal. There is no distension.     Palpations: Abdomen is soft.     Tenderness: There is no abdominal tenderness.  Skin:    General: Skin is warm and dry.  Neurological:     Mental Status: She is alert and oriented to person, place, and time.  Psychiatric:  Mood and Affect: Mood normal.        Behavior: Behavior normal.        Thought Content: Thought content normal.        Judgment: Judgment normal.      MD Evaluation Airway: WNL Heart: WNL Abdomen: WNL Chest/ Lungs: WNL ASA  Classification: 3 Mallampati/Airway Score: Two   Imaging: No results found.  Labs:  CBC: Recent Labs    09/02/18 0304 09/03/18 0218 10/02/18 0820 11/04/18 1121  WBC 8.8 9.7 9.6 12.0*  HGB 10.2* 10.5* 12.5 13.1  HCT 32.4* 33.8* 40.5 41.5  PLT 180 187 317 353    COAGS: Recent Labs    08/30/18 2355 08/31/18 1134 08/31/18 1458 11/04/18 1121  INR 1.4* 1.5*  --  1.0  APTT 26 104* 37* 29    BMP: Recent Labs    09/01/18 0437 09/02/18 0304 09/03/18 0218 10/02/18 0820  NA 143 139 138 140  K 4.2 3.8 3.8 4.3  CL 115* 107 106 106  CO2 18* 23 25 26   GLUCOSE 127* 176* 188* 127*  BUN 20 15 10 9   CALCIUM 8.1* 8.2* 8.5* 9.3  CREATININE 0.87 0.97 0.82 0.69  GFRNONAA >60 60* >60 >60  GFRAA >60 >60 >60 >60    LIVER FUNCTION TESTS: Recent Labs    09/01/18 0437 09/02/18 0304 09/03/18 0218 10/02/18 0820  BILITOT 1.0 0.8 0.9 0.4  AST 191* 131* 82* 18  ALT 182* 177* 147* 19  ALKPHOS 119 112 104 72  PROT 5.6* 5.6* 5.8* 7.1  ALBUMIN 2.5* 2.5* 2.6* 3.5    TUMOR MARKERS: No results for input(s): AFPTM, CEA, CA199, CHROMGRNA in the last 8760 hours.  Assessment and Plan:  68 y/o F with previously known mass near the right parotid tail which has recently enlarged. A biopsy has been requested for further evaluation for which she presents today.  Patient has been NPO since 6 am, last dose of Lovenox yesterday morning, last dose of  Xarelto 10/20, she took her diabetic medications this morning and CBG upon arrival to short stay was 20 - she was given 1/2 amp of D50 and glucose improved to 135. Afebrile, WBC 12.0, hgb 13.1, plt 353, INR 1.0.  Risks and benefits of right parotid mass biopsy was discussed with the patient and/or patient's family including, but not limited to bleeding, infection, damage to adjacent structures or low yield requiring additional tests.  All of the questions were answered and there is agreement to proceed.  Consent signed and in chart.   Thank you for this interesting consult.  I greatly enjoyed meeting BROOKES STALOCH and look forward to participating in their care.  A copy of this report was sent to the requesting provider on this date.  Electronically Signed: Joaquim Nam, PA-C 11/04/2018, 12:44 PM   I spent a total of 15 Minutes in face to face in clinical consultation, greater than 50% of which was counseling/coordinating care for right parotid mass biopsy.

## 2018-11-04 NOTE — Progress Notes (Signed)
Discharge instructions reviewed with pt and her son (via telephone) both voice understanding.  

## 2018-11-04 NOTE — Progress Notes (Signed)
Spoke with Larene Beach about CBG new orders noted also informed her that pt ate at 0600 and took all her meds this am

## 2018-11-09 LAB — AEROBIC/ANAEROBIC CULTURE W GRAM STAIN (SURGICAL/DEEP WOUND)
Culture: NO GROWTH
Special Requests: NORMAL

## 2018-11-09 LAB — CYTOLOGY - NON PAP

## 2018-11-10 ENCOUNTER — Other Ambulatory Visit: Payer: Self-pay

## 2018-11-11 ENCOUNTER — Encounter (HOSPITAL_COMMUNITY): Payer: Self-pay | Admitting: Hematology

## 2018-11-11 ENCOUNTER — Inpatient Hospital Stay (HOSPITAL_COMMUNITY): Payer: Medicare PPO | Attending: Hematology | Admitting: Hematology

## 2018-11-11 VITALS — BP 146/87 | HR 113 | Temp 98.0°F | Resp 20

## 2018-11-11 DIAGNOSIS — C50911 Malignant neoplasm of unspecified site of right female breast: Secondary | ICD-10-CM | POA: Diagnosis present

## 2018-11-11 DIAGNOSIS — M7989 Other specified soft tissue disorders: Secondary | ICD-10-CM | POA: Insufficient documentation

## 2018-11-11 DIAGNOSIS — Z8619 Personal history of other infectious and parasitic diseases: Secondary | ICD-10-CM | POA: Insufficient documentation

## 2018-11-11 DIAGNOSIS — C50319 Malignant neoplasm of lower-inner quadrant of unspecified female breast: Secondary | ICD-10-CM | POA: Diagnosis not present

## 2018-11-11 DIAGNOSIS — I2699 Other pulmonary embolism without acute cor pulmonale: Secondary | ICD-10-CM | POA: Insufficient documentation

## 2018-11-11 DIAGNOSIS — G8929 Other chronic pain: Secondary | ICD-10-CM | POA: Diagnosis not present

## 2018-11-11 DIAGNOSIS — Z170 Estrogen receptor positive status [ER+]: Secondary | ICD-10-CM | POA: Insufficient documentation

## 2018-11-11 DIAGNOSIS — Z7901 Long term (current) use of anticoagulants: Secondary | ICD-10-CM | POA: Insufficient documentation

## 2018-11-11 DIAGNOSIS — M545 Low back pain: Secondary | ICD-10-CM | POA: Insufficient documentation

## 2018-11-11 DIAGNOSIS — R0602 Shortness of breath: Secondary | ICD-10-CM | POA: Diagnosis not present

## 2018-11-11 DIAGNOSIS — Z87891 Personal history of nicotine dependence: Secondary | ICD-10-CM | POA: Diagnosis not present

## 2018-11-11 NOTE — Progress Notes (Signed)
Pt is taking Femara as prescribed with no side effects. 

## 2018-11-11 NOTE — Progress Notes (Signed)
[place], [place] [address]   CLINIC:  Medical Oncology/Hematology  PCP:  Abran Richard, MD [address] [place]  [phone]   REASON FOR VISIT:  Follow-up for Breast Cancer   CURRENT THERAPY: Clinical Surveillance   BRIEF ONCOLOGIC HISTORY:  Oncology History  Right breast cancer T1bN0M0 ER Positive 2012  07/18/2010 Initial Diagnosis   Right breast cancer T1bN0M0 ER Positive 2012   07/11/2015 Pathology Results   BCI- 7.3% risk of late recurrence (5-10 years) HIGH risk, HIGH liklihood of benefit from extended endocrine therapy, with 16.5% absolute benefit, 13.5% risk of overall recurrence (0-10 years) intermediate risk, with high likelihood of benefit.       INTERVAL HISTORY:  Ms. Mackert 68 y.o. female seen for follow-up of right breast cancer.  She was admitted to the hospital from 08/30/2018 through 09/03/2018 with pulmonary embolism, extensive and bilateral.  She was started back on Xarelto.  She is tolerating it very well.  No bleeding episodes reported.  She was also diagnosed with COVID-19 infection on 08/11/2018.  She reports minor swelling of the feet.  She reports some shortness of breath with activity.  Appetite is 100%.  Energy levels are 50%.  Low back pain is rated as 5 out of 10 and chronic.   REVIEW OF SYSTEMS:  Review of Systems  Cardiovascular: Positive for leg swelling.  Musculoskeletal: Positive for back pain.  Neurological: Positive for numbness.  Psychiatric/Behavioral: Positive for sleep disturbance.  All other systems reviewed and are negative.    PAST MEDICAL/SURGICAL HISTORY:  Past Medical History:  Diagnosis Date  . Arthritis    back/saw Dr. Aline Brochure  . Breast cancer ([place])   . Cancer (HCC)    breast  . Diabetes mellitus   . Emphysema lung ([place])   . Hypertension   . Low back pain   . Obesity   . Pulmonary embolism [facility])    January 2017, August 2020  . Sleep apnea 04/2013   Past  Surgical History:  Procedure Laterality Date  . BIOPSY BREAST    . TUBAL LIGATION       SOCIAL HISTORY:  Social History   Socioeconomic History  . Marital status: Single    Spouse name: Not on file  . Number of children: Not on file  . Years of education: Not on file  . Highest education level: Not on file  Occupational History  . Not on file  Social Needs  . Financial resource strain: Not on file  . Food insecurity    Worry: Not on file    Inability: Not on file  . Transportation needs    Medical: Not on file    Non-medical: Not on file  Tobacco Use  . Smoking status: Former Smoker    Packs/day: 1.50    Years: 17.00    Pack years: 25.50    Types: Cigarettes    Quit date: 12/18/1994    Years since quitting: 23.9  . Smokeless tobacco: Never Used  Substance and Sexual Activity  . Alcohol use: No  . Drug use: No  . Sexual activity: Not on file  Lifestyle  . Physical activity    Days per week: Not on file    Minutes per session: Not on file  . Stress: Not on file  Relationships  . Social Herbalist on phone: Not on file    Gets together: Not on file    Attends  religious service: Not on file    Active member of club or organization: Not on file    Attends meetings of clubs or organizations: Not on file    Relationship status: Not on file  . Intimate partner violence    Fear of current or ex partner: Not on file    Emotionally abused: Not on file    Physically abused: Not on file    Forced sexual activity: Not on file  Other Topics Concern  . Not on file  Social History Narrative  . Not on file    FAMILY HISTORY:  Family History  Problem Relation Age of Onset  . Liver cancer Mother   . Cancer Mother   . Diabetes Mother   . Breast cancer Sister   . Cancer Sister     CURRENT MEDICATIONS:  Outpatient Encounter Medications as of 11/11/2018  Medication Sig  . Alcohol Swabs (B-D SINGLE USE SWABS REGULAR) PADS   . Lancets Misc. (ACCU-CHEK  FASTCLIX LANCET) KIT   . letrozole (FEMARA) 2.5 MG tablet Take 2.5 mg by mouth daily.   Marland Kitchen acetaminophen (TYLENOL 8 HOUR ARTHRITIS PAIN) 650 MG CR tablet Take 1,300 mg by mouth 2 (two) times daily.  Marland Kitchen atorvastatin (LIPITOR) 40 MG tablet Take 40 mg by mouth every morning.   Marland Kitchen glipiZIDE (GLUCOTROL) 10 MG tablet Take 10 mg by mouth 2 (two) times daily before a meal.   . metFORMIN (GLUCOPHAGE) 1000 MG tablet Take 1,000 mg by mouth 2 (two) times daily.  . metoprolol succinate (TOPROL-XL) 25 MG 24 hr tablet Take 25 mg by mouth every morning.   . Rivaroxaban (XARELTO) 15 MG TABS tablet Take 15 mg by mouth daily with supper.   No facility-administered encounter medications on file as of 11/11/2018.     ALLERGIES:  Allergies  Allergen Reactions  . Lisinopril Shortness Of Breath, Swelling, Cough and Other (See Comments)     PHYSICAL EXAM:  ECOG Performance status: 1  Vitals:   11/11/18 0934  BP: (!) 146/87  Pulse: (!) 113  Resp: 20  Temp: 98 F (36.7 C)  SpO2: 95%   There were no vitals filed for this visit.  Physical Exam Vitals signs reviewed.  Constitutional:      Appearance: Normal appearance. She is obese.  HENT:     Head: Normocephalic.     Nose: Nose normal.     Mouth/Throat:     Mouth: Mucous membranes are moist.     Pharynx: Oropharynx is clear.  Eyes:     Extraocular Movements: Extraocular movements intact.     Conjunctiva/sclera: Conjunctivae normal.  Neck:     Musculoskeletal: Normal range of motion.  Cardiovascular:     Rate and Rhythm: Normal rate and regular rhythm.     Pulses: Normal pulses.     Heart sounds: Normal heart sounds.  Pulmonary:     Effort: Pulmonary effort is normal.     Breath sounds: Normal breath sounds.  Abdominal:     General: Bowel sounds are normal.     Palpations: Abdomen is soft.  Musculoskeletal: Normal range of motion.  Skin:    General: Skin is warm and dry.  Neurological:     General: No focal deficit present.     Mental  Status: She is alert and oriented to person, place, and time.  Psychiatric:        Mood and Affect: Mood normal.        Behavior: Behavior normal.  Thought Content: Thought content normal.        Judgment: Judgment normal.      LABORATORY DATA:  I have reviewed the labs as listed.  CBC    Component Value Date/Time   WBC 12.0 (H) 11/04/2018 1121   RBC 4.52 11/04/2018 1121   HGB 13.1 11/04/2018 1121   HCT 41.5 11/04/2018 1121   PLT 353 11/04/2018 1121   MCV 91.8 11/04/2018 1121   MCH 29.0 11/04/2018 1121   MCHC 31.6 11/04/2018 1121   RDW 14.7 11/04/2018 1121   LYMPHSABS 1.5 10/02/2018 0820   MONOABS 0.4 10/02/2018 0820   EOSABS 0.1 10/02/2018 0820   BASOSABS 0.0 10/02/2018 0820   CMP Latest Ref Rng & Units 10/02/2018 09/03/2018 09/02/2018  Glucose 70 - 99 mg/dL 127(H) 188(H) 176(H)  BUN 8 - 23 mg/dL '9 10 15  '$ Creatinine 0.44 - 1.00 mg/dL 0.69 0.82 0.97  Sodium 135 - 145 mmol/L 140 138 139  Potassium 3.5 - 5.1 mmol/L 4.3 3.8 3.8  Chloride 98 - 111 mmol/L 106 106 107  CO2 22 - 32 mmol/L '26 25 23  '$ Calcium 8.9 - 10.3 mg/dL 9.3 8.5(L) 8.2(L)  Total Protein 6.5 - 8.1 g/dL 7.1 5.8(L) 5.6(L)  Total Bilirubin 0.3 - 1.2 mg/dL 0.4 0.9 0.8  Alkaline Phos 38 - 126 U/L 72 104 112  AST 15 - 41 U/L 18 82(H) 131(H)  ALT 0 - 44 U/L 19 147(H) 177(H)       Radiology: I have personally reviewed her recent scans and discussed with the patient.    ASSESSMENT & PLAN:   Right breast cancer T1bN0M0 ER Positive 2012 1.  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 -She underwent radiation therapy followed by anastrozole from November 2012 through end of 2017. -Due to arthralgias, it was changed to letrozole in February 2018. - Mammogram on 06/22/2018 shows right breast group of coarse heterogeneous calcifications in the central medial port.  This spans  1.3 x 8 x 5 mm.  Stereotactic biopsy of the right breast was recommended. -Right breast biopsy on 06/28/2018 suggestive of fibroadenoma. -She is continuing letrozole at this time.  She was reportedly diagnosed with COVID-19 on 08/11/2018. -She underwent right parotid cystic mass biopsy on 11/04/2018 which was benign. -Physical examination today did not reveal any palpable masses.  We reviewed her labs.  He will be seen back in 6 months for follow-up.  We will check a vitamin D at that time.   2.  Pulmonary embolism: -She had 1 previous pulmonary embolism in 2017, unprovoked, Xarelto discontinued in 2019. 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 -She is back on Xarelto at this time.  She will take Xarelto indefinitely.  3.  Leukocytosis: -She had intermittent leukocytosis.  It is stable.  4.  Bone mineral density: - DEXA scan on 06/16/2018 shows T score of 0.1 which was in the normal range. -She will continue calcium and vitamin D supplements.  Will check vitamin D level next visit.   Total time spent is 25 minutes with more than 50% of the time spent face-to-face discussing biopsy results, counseling and coordination of care.  Orders placed this encounter:  Orders Placed This Encounter  Procedures  . CBC with Differential/Platelet  . Comprehensive metabolic panel  . Vitamin D 25 hydroxy  Derek Jack, MD [place] [phone]

## 2018-11-11 NOTE — Patient Instructions (Addendum)
[place] at [facility] Discharge Instructions  You were seen today by Dr. Delton Coombes. He went over your recent test results. Please continue taking the Femara as well as Xarelto. He will see you back in 6 months for labs and follow up.   Thank you for choosing [place] at [facility] to provide your oncology and hematology care.  To afford each patient quality time with our provider, please arrive at least 15 minutes before your scheduled appointment time.   If you have a lab appointment with the [facility] please come in thru the  Main Entrance and check in at the main information desk  You need to re-schedule your appointment should you arrive 10 or more minutes late.  We strive to give you quality time with our providers, and arriving late affects you and other patients whose appointments are after yours.  Also, if you no show three or more times for appointments you may be dismissed from the clinic at the providers discretion.     Again, thank you for choosing [facility].  Our hope is that these requests will decrease the amount of time that you wait before being seen by our physicians.       _____________________________________________________________  Should you have questions after your visit to [facility], please contact our office at [phone] between the hours of 8:00 a.m. and 4:30 p.m.  Voicemails left after 4:00 p.m. will not be returned until the following business day.  For prescription refill requests, have your pharmacy contact our office and allow 72 hours.    Cancer Center Support Programs:   > Cancer Support Group  2nd Tuesday of the month 1pm-2pm, Journey Room

## 2018-11-12 ENCOUNTER — Encounter (HOSPITAL_COMMUNITY): Payer: Self-pay | Admitting: Hematology

## 2018-11-12 NOTE — Assessment & Plan Note (Addendum)
1.  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 -She underwent radiation therapy followed by anastrozole from November 2012 through end of 2017. -Due to arthralgias, it was changed to letrozole in February 2018. - Mammogram on 06/22/2018 shows right breast group of coarse heterogeneous calcifications in the central medial port.  This spans 1.3 x 8 x 5 mm.  Stereotactic biopsy of the right breast was recommended. -Right breast biopsy on 06/28/2018 suggestive of fibroadenoma. -She is continuing letrozole at this time.  She was reportedly diagnosed with COVID-19 on 08/11/2018. -She underwent right parotid cystic mass biopsy on 11/04/2018 which was benign. -Physical examination today did not reveal any palpable masses.  We reviewed her labs.  He will be seen back in 6 months for follow-up.  We will check a vitamin D at that time.   2.  Pulmonary embolism: -She had 1 previous pulmonary embolism in 2017, unprovoked, Xarelto discontinued in 2019. 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 -She is back on Xarelto at this time.  She will take Xarelto indefinitely.  3.  Leukocytosis: -She had intermittent leukocytosis.  It is stable.  4.  Bone mineral density: - DEXA scan on 06/16/2018 shows T score of 0.1 which was in the normal range. -She will continue calcium and vitamin D supplements.  Will check vitamin D level next visit.

## 2019-01-11 ENCOUNTER — Other Ambulatory Visit (HOSPITAL_COMMUNITY): Payer: Medicare PPO

## 2019-01-18 ENCOUNTER — Ambulatory Visit (HOSPITAL_COMMUNITY): Payer: Medicare PPO | Admitting: Hematology

## 2019-04-20 ENCOUNTER — Other Ambulatory Visit (HOSPITAL_COMMUNITY): Payer: Self-pay | Admitting: Hematology

## 2019-05-05 ENCOUNTER — Inpatient Hospital Stay (HOSPITAL_COMMUNITY): Payer: Medicare PPO | Attending: Hematology

## 2019-05-05 ENCOUNTER — Other Ambulatory Visit: Payer: Self-pay

## 2019-05-05 DIAGNOSIS — D72829 Elevated white blood cell count, unspecified: Secondary | ICD-10-CM | POA: Insufficient documentation

## 2019-05-05 DIAGNOSIS — E559 Vitamin D deficiency, unspecified: Secondary | ICD-10-CM | POA: Insufficient documentation

## 2019-05-05 DIAGNOSIS — C50319 Malignant neoplasm of lower-inner quadrant of unspecified female breast: Secondary | ICD-10-CM

## 2019-05-05 DIAGNOSIS — Z7901 Long term (current) use of anticoagulants: Secondary | ICD-10-CM | POA: Diagnosis not present

## 2019-05-05 DIAGNOSIS — C50911 Malignant neoplasm of unspecified site of right female breast: Secondary | ICD-10-CM | POA: Diagnosis not present

## 2019-05-05 DIAGNOSIS — Z87891 Personal history of nicotine dependence: Secondary | ICD-10-CM | POA: Insufficient documentation

## 2019-05-05 DIAGNOSIS — I2699 Other pulmonary embolism without acute cor pulmonale: Secondary | ICD-10-CM | POA: Diagnosis not present

## 2019-05-05 LAB — COMPREHENSIVE METABOLIC PANEL
ALT: 23 U/L (ref 0–44)
AST: 21 U/L (ref 15–41)
Albumin: 3.8 g/dL (ref 3.5–5.0)
Alkaline Phosphatase: 89 U/L (ref 38–126)
Anion gap: 13 (ref 5–15)
BUN: 15 mg/dL (ref 8–23)
CO2: 25 mmol/L (ref 22–32)
Calcium: 9.9 mg/dL (ref 8.9–10.3)
Chloride: 101 mmol/L (ref 98–111)
Creatinine, Ser: 0.87 mg/dL (ref 0.44–1.00)
GFR calc Af Amer: >60 mL/min (ref >60)
GFR calc non Af Amer: >60 mL/min (ref >60)
Glucose, Bld: 151 mg/dL — ABNORMAL HIGH (ref 70–99)
Potassium: 4.2 mmol/L (ref 3.5–5.1)
Sodium: 139 mmol/L (ref 135–145)
Total Bilirubin: 0.6 mg/dL (ref 0.3–1.2)
Total Protein: 7.9 g/dL (ref 6.5–8.1)

## 2019-05-05 LAB — CBC WITH DIFFERENTIAL/PLATELET
Abs Immature Granulocytes: 0.04 10*3/uL (ref 0.00–0.07)
Basophils Absolute: 0.1 10*3/uL (ref 0.0–0.1)
Basophils Relative: 1 %
Eosinophils Absolute: 0.2 10*3/uL (ref 0.0–0.5)
Eosinophils Relative: 2 %
HCT: 44.1 % (ref 36.0–46.0)
Hemoglobin: 13.8 g/dL (ref 12.0–15.0)
Immature Granulocytes: 0 %
Lymphocytes Relative: 24 %
Lymphs Abs: 2.5 10*3/uL (ref 0.7–4.0)
MCH: 28.8 pg (ref 26.0–34.0)
MCHC: 31.3 g/dL (ref 30.0–36.0)
MCV: 91.9 fL (ref 80.0–100.0)
Monocytes Absolute: 0.6 10*3/uL (ref 0.1–1.0)
Monocytes Relative: 5 %
Neutro Abs: 7 10*3/uL (ref 1.7–7.7)
Neutrophils Relative %: 68 %
Platelets: 329 10*3/uL (ref 150–400)
RBC: 4.8 MIL/uL (ref 3.87–5.11)
RDW: 15.2 % (ref 11.5–15.5)
WBC: 10.3 10*3/uL (ref 4.0–10.5)
nRBC: 0 % (ref 0.0–0.2)

## 2019-05-05 LAB — VITAMIN D 25 HYDROXY (VIT D DEFICIENCY, FRACTURES): Vit D, 25-Hydroxy: 11.46 ng/mL — ABNORMAL LOW (ref 30–100)

## 2019-05-12 ENCOUNTER — Encounter (HOSPITAL_COMMUNITY): Payer: Self-pay | Admitting: Nurse Practitioner

## 2019-05-12 ENCOUNTER — Other Ambulatory Visit: Payer: Self-pay

## 2019-05-12 ENCOUNTER — Inpatient Hospital Stay (HOSPITAL_COMMUNITY): Payer: Medicare PPO | Admitting: Nurse Practitioner

## 2019-05-12 VITALS — BP 164/83 | HR 113 | Temp 96.8°F | Resp 22 | Wt 221.4 lb

## 2019-05-12 DIAGNOSIS — C50319 Malignant neoplasm of lower-inner quadrant of unspecified female breast: Secondary | ICD-10-CM

## 2019-05-12 DIAGNOSIS — E559 Vitamin D deficiency, unspecified: Secondary | ICD-10-CM | POA: Diagnosis not present

## 2019-05-12 DIAGNOSIS — C50911 Malignant neoplasm of unspecified site of right female breast: Secondary | ICD-10-CM | POA: Diagnosis not present

## 2019-05-12 MED ORDER — ERGOCALCIFEROL 1.25 MG (50000 UT) PO CAPS: 50000.0000 [IU] | ORAL_CAPSULE | ORAL | 3 refills | Status: DC

## 2019-05-12 NOTE — Progress Notes (Signed)
[name], [place] [address]   CLINIC:  Medical Oncology/Hematology  PCP:  Abran Richard, MD [address] [place] [address] [phone]   REASON FOR VISIT: Follow-up for breast cancer   CURRENT THERAPY: Letrozole  BRIEF ONCOLOGIC HISTORY:  Oncology History  Right breast cancer T1bN0M0 ER Positive 2012  07/18/2010 Initial Diagnosis   Right breast cancer T1bN0M0 ER Positive 2012   07/11/2015 Pathology Results   BCI- 7.3% risk of late recurrence (5-10 years) HIGH risk, HIGH liklihood of benefit from extended endocrine therapy, with 16.5% absolute benefit, 13.5% risk of overall recurrence (0-10 years) intermediate risk, with high likelihood of benefit.     INTERVAL HISTORY:  Alexa Castillo 69 y.o. female returns for routine follow-up for breast cancer.  Patient reports she is doing well since her last visit.  She denies any new lumps or bumps present.  She denies any new bone pain. Denies any nausea, vomiting, or diarrhea. Denies any new pains. Had not noticed any recent bleeding such as epistaxis, hematuria or hematochezia. Denies recent chest pain on exertion, shortness of breath on minimal exertion, pre-syncopal episodes, or palpitations. Denies any numbness or tingling in hands or feet. Denies any recent fevers, infections, or recent hospitalizations. Patient reports appetite at 100% and energy level at 50%.  She is eating well maintain her weight at this time.    REVIEW OF SYSTEMS:  Review of Systems  Respiratory: Positive for shortness of breath.   Cardiovascular: Positive for leg swelling.  All other systems reviewed and are negative.    PAST MEDICAL/SURGICAL HISTORY:  Past Medical History:  Diagnosis Date  . Arthritis    back/saw Dr. Aline Brochure  . Breast cancer ([place])   . Cancer (H[facility])    breast  . Diabetes mellitus   . Emphysema lung ([place])   . Hypertension   . Low back pain   . Obesity   . Pulmonary embolism [facility])    January 2017, August 2020  . Sleep apnea 04/2013   Past Surgical History:  Procedure Laterality Date  . BIOPSY BREAST    . TUBAL LIGATION       SOCIAL HISTORY:  Social History   Socioeconomic History  . Marital status: Single    Spouse name: Not on file  . Number of children: Not on file  . Years of education: Not on file  . Highest education level: Not on file  Occupational History  . Not on file  Tobacco Use  . Smoking status: Former Smoker    Packs/day: 1.50    Years: 17.00    Pack years: 25.50    Types: Cigarettes    Quit date: 12/18/1994    Years since quitting: 24.4  . Smokeless tobacco: Never Used  Substance and Sexual Activity  . Alcohol use: No  . Drug use: No  . Sexual activity: Not on file  Other Topics Concern  . Not on file  Social History Narrative  . Not on file   Social Determinants of Health   Financial Resource Strain:   . Difficulty of Paying Living Expenses:   Food Insecurity:   . Worried About Charity fundraiser in the Last Year:   . Arboriculturist in the Last Year:   Transportation Needs:   . Film/video editor (Medical):   Marland Kitchen Lack of Transportation (Non-Medical):   Physical Activity:   . Days of Exercise per Week:   . Minutes of  Exercise per Session:   Stress:   . Feeling of Stress :   Social Connections:   . Frequency of Communication with Friends and Family:   . Frequency of Social Gatherings with Friends and Family:   . Attends Religious Services:   . Active Member of Clubs or Organizations:   . Attends Archivist Meetings:   Marland Kitchen Marital Status:   Intimate Partner Violence:   . Fear of Current or Ex-Partner:   . Emotionally Abused:   Marland Kitchen Physically Abused:   . Sexually Abused:     FAMILY HISTORY:  Family History  Problem Relation Age of Onset  . Liver cancer Mother   . Cancer Mother   . Diabetes Mother   . Breast cancer Sister   . Cancer Sister     CURRENT MEDICATIONS:  Outpatient Encounter Medications  as of 05/12/2019  Medication Sig  . Accu-Chek FastClix Lancets MISC   . acetaminophen (TYLENOL 8 HOUR ARTHRITIS PAIN) 650 MG CR tablet Take 1,300 mg by mouth 2 (two) times daily.  . Alcohol Swabs (B-D SINGLE USE SWABS REGULAR) PADS   . atorvastatin (LIPITOR) 40 MG tablet Take 40 mg by mouth every morning.   Marland Kitchen EPINEPHrine 0.3 mg/0.3 mL IJ SOAJ injection Inject 0.3 mg into the muscle as needed.   Marland Kitchen glipiZIDE (GLUCOTROL) 10 MG tablet Take 10 mg by mouth 2 (two) times daily before a meal.   . Lancets Misc. (ACCU-CHEK FASTCLIX LANCET) KIT   . letrozole (FEMARA) 2.5 MG tablet TAKE 1 TABLET EVERY DAY  . metFORMIN (GLUCOPHAGE) 1000 MG tablet Take 1,000 mg by mouth 2 (two) times daily.  . metoprolol succinate (TOPROL-XL) 25 MG 24 hr tablet Take 25 mg by mouth every morning.   . warfarin (COUMADIN) 1 MG tablet Take 1 mg by mouth daily.   Marland Kitchen warfarin (COUMADIN) 5 MG tablet Take 5 mg by mouth daily.   . [DISCONTINUED] Rivaroxaban (XARELTO) 15 MG TABS tablet Take 15 mg by mouth daily with supper.   No facility-administered encounter medications on file as of 05/12/2019.    ALLERGIES:  Allergies  Allergen Reactions  . Lisinopril Shortness Of Breath, Swelling, Cough and Other (See Comments)     PHYSICAL EXAM:  ECOG Performance status: 1  Vitals:   05/12/19 1142  BP: (!) 164/83  Pulse: (!) 113  Resp: (!) 22  Temp: (!) 96.8 F (36 C)  SpO2: 90%   Filed Weights   05/12/19 1142  Weight: 221 lb 6.4 oz (100.4 kg)   Physical Exam Constitutional:      Appearance: Normal appearance. She is normal weight.  Cardiovascular:     Rate and Rhythm: Normal rate and regular rhythm.     Heart sounds: Normal heart sounds.  Pulmonary:     Effort: Pulmonary effort is normal.     Breath sounds: Normal breath sounds.  Abdominal:     General: Bowel sounds are normal.     Palpations: Abdomen is soft.  Musculoskeletal:        General: Normal range of motion.  Skin:    General: Skin is warm.    Neurological:     Mental Status: She is alert and oriented to person, place, and time. Mental status is at baseline.  Psychiatric:        Mood and Affect: Mood normal.        Behavior: Behavior normal.        Thought Content: Thought content normal.  Judgment: Judgment normal.   Breast: No palpable masses, no skin changes or nipple discharge, no adenopathy.    LABORATORY DATA:  I have reviewed the labs as listed.  CBC    Component Value Date/Time   WBC 10.3 05/05/2019 1052   RBC 4.80 05/05/2019 1052   HGB 13.8 05/05/2019 1052   HCT 44.1 05/05/2019 1052   PLT 329 05/05/2019 1052   MCV 91.9 05/05/2019 1052   MCH 28.8 05/05/2019 1052   MCHC 31.3 05/05/2019 1052   RDW 15.2 05/05/2019 1052   LYMPHSABS 2.5 05/05/2019 1052   MONOABS 0.6 05/05/2019 1052   EOSABS 0.2 05/05/2019 1052   BASOSABS 0.1 05/05/2019 1052   CMP Latest Ref Rng & Units 05/05/2019 10/02/2018 09/03/2018  Glucose 70 - 99 mg/dL 151(H) 127(H) 188(H)  BUN 8 - 23 mg/dL '15 9 10  '$ Creatinine 0.44 - 1.00 mg/dL 0.87 0.69 0.82  Sodium 135 - 145 mmol/L 139 140 138  Potassium 3.5 - 5.1 mmol/L 4.2 4.3 3.8  Chloride 98 - 111 mmol/L 101 106 106  CO2 22 - 32 mmol/L '25 26 25  '$ Calcium 8.9 - 10.3 mg/dL 9.9 9.3 8.5(L)  Total Protein 6.5 - 8.1 g/dL 7.9 7.1 5.8(L)  Total Bilirubin 0.3 - 1.2 mg/dL 0.6 0.4 0.9  Alkaline Phos 38 - 126 U/L 89 72 104  AST 15 - 41 U/L 21 18 82(H)  ALT 0 - 44 U/L 23 19 147(H)    All questions were answered to patient's stated satisfaction. Encouraged patient to call with any new concerns or questions before his next visit to the cancer center and we can certain see him sooner, if needed.     ASSESSMENT & PLAN:  Right breast cancer T1bN0M0 ER Positive 2012 1.  Stage I right breast infiltrating ductal carcinoma: -Status post lumpectomy on 07/24/2010, 0.7 cm IDC, 0/1 lymph node positive, ER/PR positive, Ki-67 50%, HER-2 negative. -She underwent radiation therapy followed by anastrozole starting  on November 2012 through the end of 2017. -Due to arthralgias it was changed to letrozole in February 2018.  She is tolerating it well.  She will continue letrozole through 2022. -Last mammogram on 06/22/2018 showed right breast group of coarse heterogeneous calcifications in the central medial port.  This spans 1.3 x 8 x 5 mm.  Stereotactic biopsy of the right breast was recommended.   -Right breast biopsy on 06/28/2018 was suggestive of fibroadenoma. -She underwent right parotid cystic mass biopsy on 11/04/2018 which was benign. -Physical examination today not reveal any palpable masses. -Labs done on 05/05/2019 were all WNL. -She will follow-up in 6 months with repeat labs.  2.  Pulmonary embolism: -She had 1 previous pulmonary embolism in 2017, unprovoked, Xarelto discontinued in 2019. -She was again diagnosed with an extensive bilateral pulmonary embolism which was hospitalized from 08/30/2018 through 09/03/2018. -She is back on Xarelto at this time.  She will take Xarelto indefinitely.  3.  Leukocytosis: -She has intermittent leukocytosis.  This is stable. -Labs done on 05/05/2019 showed WBC of 10.3  4.  Bone mineral density: -DEXA scan on 06/16/2018 showed T score of 0.1 which was in the normal range. -She will continue calcium and vitamin D supplements daily.  5.  Vitamin D deficiency: -Labs done on 05/05/2019 showed vitamin D level 11.46 -Vitamin D 50,000 units weekly was prescribed. -We will recheck her level at her next visit.     Orders placed this encounter:  Orders Placed This Encounter  Procedures  . MM DIAG BREAST  TOMO BILATERAL  . Lactate dehydrogenase  . Vitamin B12  . VITAMIN D 25 Hydroxy (Vit-D Deficiency, Fractures)  . Folate  . CBC with Differential/Platelet  . Comprehensive metabolic panel      Francene Finders, FNP-C [place] [phone]

## 2019-05-12 NOTE — Assessment & Plan Note (Signed)
1.  Stage I right breast infiltrating ductal carcinoma: -Status post lumpectomy on 07/24/2010, 0.7 cm IDC, 0/1 lymph node positive, ER/PR positive, Ki-67 50%, HER-2 negative. -She underwent radiation therapy followed by anastrozole starting on November 2012 through the end of 2017. -Due to arthralgias it was changed to letrozole in February 2018.  She is tolerating it well.  She will continue letrozole through 2022. -Last mammogram on 06/22/2018 showed right breast group of coarse heterogeneous calcifications in the central medial port.  This spans 1.3 x 8 x 5 mm.  Stereotactic biopsy of the right breast was recommended.   -Right breast biopsy on 06/28/2018 was suggestive of fibroadenoma. -She underwent right parotid cystic mass biopsy on 11/04/2018 which was benign. -Physical examination today not reveal any palpable masses. -Labs done on 05/05/2019 were all WNL. -She will follow-up in 6 months with repeat labs.  2.  Pulmonary embolism: -She had 1 previous pulmonary embolism in 2017, unprovoked, Xarelto discontinued in 2019. -She was again diagnosed with an extensive bilateral pulmonary embolism which was hospitalized from 08/30/2018 through 09/03/2018. -She is back on Xarelto at this time.  She will take Xarelto indefinitely.  3.  Leukocytosis: -She has intermittent leukocytosis.  This is stable. -Labs done on 05/05/2019 showed WBC of 10.3  4.  Bone mineral density: -DEXA scan on 06/16/2018 showed T score of 0.1 which was in the normal range. -She will continue calcium and vitamin D supplements daily.  5.  Vitamin D deficiency: -Labs done on 05/05/2019 showed vitamin D level 11.46 -Vitamin D 50,000 units weekly was prescribed. -We will recheck her level at her next visit.

## 2019-06-28 ENCOUNTER — Encounter (HOSPITAL_COMMUNITY): Payer: Medicare PPO

## 2019-06-29 ENCOUNTER — Ambulatory Visit (HOSPITAL_COMMUNITY)
Admission: RE | Admit: 2019-06-29 | Discharge: 2019-06-29 | Disposition: A | Discharge status: Home / Self Care | Payer: Medicare PPO | Source: Ambulatory Visit | Attending: Nurse Practitioner | Admitting: Nurse Practitioner

## 2019-06-29 ENCOUNTER — Other Ambulatory Visit: Payer: Self-pay

## 2019-06-29 DIAGNOSIS — Z1231 Encounter for screening mammogram for malignant neoplasm of breast: Secondary | ICD-10-CM | POA: Diagnosis not present

## 2019-06-29 DIAGNOSIS — Z853 Personal history of malignant neoplasm of breast: Secondary | ICD-10-CM | POA: Diagnosis not present

## 2019-06-29 DIAGNOSIS — C50319 Malignant neoplasm of lower-inner quadrant of unspecified female breast: Secondary | ICD-10-CM

## 2019-12-06 ENCOUNTER — Other Ambulatory Visit (HOSPITAL_COMMUNITY): Payer: Self-pay

## 2019-12-06 DIAGNOSIS — C50319 Malignant neoplasm of lower-inner quadrant of unspecified female breast: Secondary | ICD-10-CM

## 2019-12-07 ENCOUNTER — Other Ambulatory Visit: Payer: Self-pay

## 2019-12-07 ENCOUNTER — Inpatient Hospital Stay (HOSPITAL_COMMUNITY): Payer: Medicare PPO | Attending: Hematology

## 2019-12-07 DIAGNOSIS — Z79899 Other long term (current) drug therapy: Secondary | ICD-10-CM | POA: Insufficient documentation

## 2019-12-07 DIAGNOSIS — C50319 Malignant neoplasm of lower-inner quadrant of unspecified female breast: Secondary | ICD-10-CM

## 2019-12-07 DIAGNOSIS — E559 Vitamin D deficiency, unspecified: Secondary | ICD-10-CM | POA: Diagnosis not present

## 2019-12-07 DIAGNOSIS — Z170 Estrogen receptor positive status [ER+]: Secondary | ICD-10-CM | POA: Insufficient documentation

## 2019-12-07 DIAGNOSIS — C50911 Malignant neoplasm of unspecified site of right female breast: Secondary | ICD-10-CM | POA: Insufficient documentation

## 2019-12-07 LAB — COMPREHENSIVE METABOLIC PANEL
ALT: 31 U/L (ref 0–44)
AST: 26 U/L (ref 15–41)
Albumin: 3.7 g/dL (ref 3.5–5.0)
Alkaline Phosphatase: 71 U/L (ref 38–126)
Anion gap: 11 (ref 5–15)
BUN: 16 mg/dL (ref 8–23)
CO2: 19 mmol/L — ABNORMAL LOW (ref 22–32)
Calcium: 9.4 mg/dL (ref 8.9–10.3)
Chloride: 109 mmol/L (ref 98–111)
Creatinine, Ser: 0.99 mg/dL (ref 0.44–1.00)
GFR, Estimated: >60 mL/min (ref >60)
Glucose, Bld: 95 mg/dL (ref 70–99)
Potassium: 4.3 mmol/L (ref 3.5–5.1)
Sodium: 139 mmol/L (ref 135–145)
Total Bilirubin: 0.6 mg/dL (ref 0.3–1.2)
Total Protein: 8 g/dL (ref 6.5–8.1)

## 2019-12-07 LAB — CBC WITH DIFFERENTIAL/PLATELET
Abs Immature Granulocytes: 0.08 10*3/uL — ABNORMAL HIGH (ref 0.00–0.07)
Basophils Absolute: 0.1 10*3/uL (ref 0.0–0.1)
Basophils Relative: 1 %
Eosinophils Absolute: 0.2 10*3/uL (ref 0.0–0.5)
Eosinophils Relative: 2 %
HCT: 43.6 % (ref 36.0–46.0)
Hemoglobin: 13.4 g/dL (ref 12.0–15.0)
Immature Granulocytes: 1 %
Lymphocytes Relative: 26 %
Lymphs Abs: 3.3 10*3/uL (ref 0.7–4.0)
MCH: 28.7 pg (ref 26.0–34.0)
MCHC: 30.7 g/dL (ref 30.0–36.0)
MCV: 93.4 fL (ref 80.0–100.0)
Monocytes Absolute: 0.6 10*3/uL (ref 0.1–1.0)
Monocytes Relative: 5 %
Neutro Abs: 8.2 10*3/uL — ABNORMAL HIGH (ref 1.7–7.7)
Neutrophils Relative %: 65 %
Platelets: 336 10*3/uL (ref 150–400)
RBC: 4.67 MIL/uL (ref 3.87–5.11)
RDW: 15.6 % — ABNORMAL HIGH (ref 11.5–15.5)
WBC: 12.5 10*3/uL — ABNORMAL HIGH (ref 4.0–10.5)
nRBC: 0 % (ref 0.0–0.2)

## 2019-12-07 LAB — VITAMIN D 25 HYDROXY (VIT D DEFICIENCY, FRACTURES): Vit D, 25-Hydroxy: 94.54 ng/mL (ref 30–100)

## 2019-12-07 LAB — FOLATE: Folate: 8.7 ng/mL (ref >5.9)

## 2019-12-07 LAB — LACTATE DEHYDROGENASE: LDH: 183 U/L (ref 98–192)

## 2019-12-07 LAB — VITAMIN B12: Vitamin B-12: 697 pg/mL (ref 180–914)

## 2019-12-14 ENCOUNTER — Inpatient Hospital Stay (HOSPITAL_COMMUNITY): Payer: Medicare PPO | Attending: Hematology | Admitting: Hematology

## 2019-12-14 ENCOUNTER — Other Ambulatory Visit: Payer: Self-pay

## 2019-12-14 VITALS — BP 159/88 | HR 94 | Resp 20 | Wt 223.6 lb

## 2019-12-14 DIAGNOSIS — C50311 Malignant neoplasm of lower-inner quadrant of right female breast: Secondary | ICD-10-CM | POA: Diagnosis not present

## 2019-12-14 DIAGNOSIS — E559 Vitamin D deficiency, unspecified: Secondary | ICD-10-CM | POA: Insufficient documentation

## 2019-12-14 DIAGNOSIS — Z923 Personal history of irradiation: Secondary | ICD-10-CM | POA: Diagnosis not present

## 2019-12-14 DIAGNOSIS — Z170 Estrogen receptor positive status [ER+]: Secondary | ICD-10-CM | POA: Diagnosis not present

## 2019-12-14 DIAGNOSIS — C50911 Malignant neoplasm of unspecified site of right female breast: Secondary | ICD-10-CM | POA: Insufficient documentation

## 2019-12-14 DIAGNOSIS — I2699 Other pulmonary embolism without acute cor pulmonale: Secondary | ICD-10-CM | POA: Diagnosis not present

## 2019-12-14 DIAGNOSIS — D72829 Elevated white blood cell count, unspecified: Secondary | ICD-10-CM | POA: Insufficient documentation

## 2019-12-14 DIAGNOSIS — Z7901 Long term (current) use of anticoagulants: Secondary | ICD-10-CM | POA: Diagnosis not present

## 2019-12-14 DIAGNOSIS — C773 Secondary and unspecified malignant neoplasm of axilla and upper limb lymph nodes: Secondary | ICD-10-CM | POA: Diagnosis not present

## 2019-12-14 NOTE — Patient Instructions (Signed)
[place] at [facility] Discharge Instructions  You were seen today by Dr. Delton Coombes. He went over your recent results and scans. Purchase vitamin D 5,000 units over the counter and take daily with calcium. You will be scheduled for a mammogram after 06/28/2020. Dr. Delton Coombes will see you back in 1 year for labs and follow up.   Thank you for choosing [place] at [facility] to provide your oncology and hematology care.  To afford each patient quality time with our provider, please arrive at least 15 minutes before your scheduled appointment time.   If you have a lab appointment with the [facility] please come in thru the Main Entrance and check in at the main information desk  You need to re-schedule your appointment should you arrive 10 or more minutes late.  We strive to give you quality time with our providers, and arriving late affects you and other patients whose appointments are after yours.  Also, if you no show three or more times for appointments you may be dismissed from the clinic at the providers discretion.     Again, thank you for choosing [facility].  Our hope is that these requests will decrease the amount of time that you wait before being seen by our physicians.       _____________________________________________________________  Should you have questions after your visit to [facility], please contact our office at [phone] between the hours of 8:00 a.m. and 4:30 p.m.  Voicemails left after 4:00 p.m. will not be returned until the following business day.  For prescription refill requests, have your pharmacy contact our office and allow 72 hours.    Cancer Center Support Programs:   > Cancer Support Group  2nd Tuesday of the month 1pm-2pm, Journey Room

## 2019-12-14 NOTE — Progress Notes (Signed)
 [facility] [address] [place], [place] [address]   Patient Care Team: Hunter, Denise, MD as PCP - General (Internal Medicine)  SUMMARY OF ONCOLOGIC HISTORY: Oncology History  Right breast cancer T1bN0M0 ER Positive 2012  07/18/2010 Initial Diagnosis   Right breast cancer T1bN0M0 ER Positive 2012   07/11/2015 Pathology Results   BCI- 7.3% risk of late recurrence (5-10 years) HIGH risk, HIGH liklihood of benefit from extended endocrine therapy, with 16.5% absolute benefit, 13.5% risk of overall recurrence (0-10 years) intermediate risk, with high likelihood of benefit.     CHIEF COMPLIANT: Follow-up of IDC of right breast   INTERVAL HISTORY: [name] is a 69 y.o. female here today for follow up of her IDC of right breast. Her last visit was on 11/11/2018.   Today she reports feeling well. She continues taking Femara and tolerating it well. She was taken off Xarelto and put on Coumadin; she checks her INR at home every Tuesday. She continues taking calcium daily and vitamin D weekly. She denies having any hot flashes or new aches.  She ambulates with a walker due to back pain.   REVIEW OF SYSTEMS:   Review of Systems  Constitutional: Positive for appetite change (50%) and fatigue (50%).  Respiratory: Positive for cough and shortness of breath.   Endocrine: Negative for hot flashes.  Musculoskeletal: Positive for back pain (5/10 back pain). Negative for arthralgias.  Psychiatric/Behavioral: Positive for sleep disturbance.  All other systems reviewed and are negative.   I have reviewed the past medical history, past surgical history, social history and family history with the patient and they are unchanged from previous note.   ALLERGIES:   is allergic to lisinopril and xarelto [rivaroxaban].   MEDICATIONS:  Current Outpatient Medications  Medication Sig Dispense Refill  . Accu-Chek FastClix Lancets MISC     . ACCU-CHEK GUIDE test strip     .  acetaminophen (TYLENOL 8 HOUR ARTHRITIS PAIN) 650 MG CR tablet Take 1,300 mg by mouth 2 (two) times daily.    . Alcohol Swabs (B-D SINGLE USE SWABS REGULAR) PADS     . atorvastatin (LIPITOR) 40 MG tablet Take 40 mg by mouth every morning.     . EPINEPHrine 0.3 mg/0.3 mL IJ SOAJ injection Inject 0.3 mg into the muscle as needed.     . ergocalciferol (VITAMIN D2) 1.25 MG (50000 UT) capsule Take 1 capsule (50,000 Units total) by mouth once a week. 16 capsule 3  . glipiZIDE (GLUCOTROL) 10 MG tablet Take 10 mg by mouth 2 (two) times daily before a meal.     . Lancets Misc. (ACCU-CHEK FASTCLIX LANCET) KIT     . letrozole (FEMARA) 2.5 MG tablet TAKE 1 TABLET EVERY DAY 90 tablet 3  . metFORMIN (GLUCOPHAGE) 1000 MG tablet Take 1,000 mg by mouth 2 (two) times daily.  1  . metoprolol succinate (TOPROL-XL) 25 MG 24 hr tablet Take 25 mg by mouth every morning.   1  . warfarin (COUMADIN) 1 MG tablet Take 1 mg by mouth daily.     . warfarin (COUMADIN) 5 MG tablet Take 5 mg by mouth daily.      No current facility-administered medications for this visit.     PHYSICAL EXAMINATION: Performance status (ECOG): 1 - Symptomatic but completely ambulatory  Vitals:   12/14/19 1331  BP: (!) 159/88  Pulse: 94  Resp: 20  SpO2: 95%   Wt Readings from Last 3 Encounters:  12/14/19 223   lb 9.6 oz (101.4 kg)  05/12/19 221 lb 6.4 oz (100.4 kg)  11/04/18 225 lb (102.1 kg)   Physical Exam Vitals reviewed.  Constitutional:      Appearance: Normal appearance.  Cardiovascular:     Rate and Rhythm: Normal rate and regular rhythm.     Pulses: Normal pulses.     Heart sounds: Normal heart sounds.  Pulmonary:     Effort: Pulmonary effort is normal.     Breath sounds: Normal breath sounds.  Chest:     Breasts:        Right: No swelling, bleeding, inverted nipple, mass, nipple discharge, skin change (lower inner lumpectomy scar well-healed) or tenderness.        Left: Normal. No swelling, bleeding, inverted nipple,  mass, nipple discharge, skin change or tenderness.  Abdominal:     Palpations: Abdomen is soft. There is no hepatomegaly, splenomegaly or mass.     Tenderness: There is no abdominal tenderness.  Lymphadenopathy:     Upper Body:     Right upper body: No axillary or pectoral adenopathy.     Left upper body: No axillary or pectoral adenopathy.  Neurological:     General: No focal deficit present.     Mental Status: She is alert and oriented to person, place, and time.  Psychiatric:        Mood and Affect: Mood normal.        Behavior: Behavior normal.     Breast Exam Chaperone: Milinda Antis, MD     LABORATORY DATA:  I have reviewed the data as listed CMP Latest Ref Rng & Units 12/07/2019 05/05/2019 10/02/2018  Glucose 70 - 99 mg/dL 95 151(H) 127(H)  BUN 8 - 23 mg/dL _0 Creatinine 0.44 - 1.00 mg/dL 0.99 0.87 0.69  Sodium 135 - 145 mmol/L 139 139 140  Potassium 3.5 - 5.1 mmol/L 4.3 4.2 4.3  Chloride 98 - 111 mmol/L 109 101 106  CO2 22 - 32 mmol/L 19(L) 25 26  Calcium 8.9 - 10.3 mg/dL 9.4 9.9 9.3  Total Protein 6.5 - 8.1 g/dL 8.0 7.9 7.1  Total Bilirubin 0.3 - 1.2 mg/dL 0.6 0.6 0.4  Alkaline Phos 38 - 126 U/L 71 89 72  AST 15 - 41 U/L _1 ALT 0 - 44 U/L _2 No results found for: TDD220 Lab Results  Component Value Date   WBC 12.5 (H) 12/07/2019   HGB 13.4 12/07/2019   HCT 43.6 12/07/2019   MCV 93.4 12/07/2019   PLT 336 12/07/2019   NEUTROABS 8.2 (H) 12/07/2019    ASSESSMENT:  1.  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 -She underwent radiation therapy followed by anastrozole from November 2012 through end of 2017. -Due to arthralgias, it was changed to letrozole in February 2018. - Mammogram on 06/22/2018 shows right breast group of coarse heterogeneous calcifications in the central medial port.  This spans 1.3 x 8  x 5 mm.  Stereotactic biopsy of the right breast was recommended. -Right breast biopsy on 06/28/2018 suggestive of fibroadenoma.  2.  Pulmonary embolism: -She had 1 previous pulmonary embolism in 2017, unprovoked, Xarelto discontinued in 2019. 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 -She is back on Xarelto at this time.  She will take Xarelto indefinitely.  3.  Leukocytosis: -She had intermittent leukocytosis.  It is stable.  4.  Bone mineral density: - DEXA scan on 06/16/2018 shows T score of 0.1 which was in the normal range. -She will continue calcium and vitamin D supplements.  Will check vitamin D level next visit.   PLAN:  1.  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 -Review of labs were within normal limits. -RTC 1 year.  We will schedule mammogram after June 2022.  2.  Pulmonary embolism: -Continue Xarelto indefinitely.  3.  Leukocytosis: -She has intermittent leukocytosis which is stable.  4.   Vitamin D deficiency: -Her vitamin D level is 94.  She is taking vitamin D 50,000 units weekly. -I have told her to discontinue weekly vitamin D. -She will start vitamin D 5000 units daily.   Breast Cancer therapy associated bone loss: I have recommended calcium, Vitamin D and weight bearing exercises.   Orders Placed This Encounter  Procedures  . MM Digital Screening    Standing Status:   Future    Standing Expiration Date:   12/13/2020    Order Specific Question:   Reason for Exam (SYMPTOM  OR DIAGNOSIS REQUIRED)    Answer:   breast cancer surveillance    Order Specific Question:   Preferred imaging location?    Answer:   [facility]    Order Specific Question:   Release to patient    Answer:   Immediate  . CBC with Differential/Platelet    Standing Status:   Future     Standing Expiration Date:   12/13/2020    Order Specific Question:   Release to patient    Answer:   Immediate  . Comprehensive metabolic panel    Standing Status:   Future    Standing Expiration Date:   12/13/2020    Order Specific Question:   Has the patient fasted?    Answer:   No    Order Specific Question:   Release to patient    Answer:   Immediate  . VITAMIN D 25 Hydroxy (Vit-D Deficiency, Fractures)    Standing Status:   Future    Standing Expiration Date:   12/13/2020   The patient has a good understanding of the overall plan. she agrees with it. she will call with any problems that may develop before the next visit here.    Derek Jack, MD [place] [phone]   I, Milinda Antis, am acting as a scribe for Dr. Sanda Linger.  I, Derek Jack MD, have reviewed the above documentation for accuracy and completeness, and I agree with the above.

## 2020-03-20 IMAGING — MG STEREOTACTIC VACUUM ASSIST RIGHT
7 of 16 series · 7 of 32 positions shown · non-contrast
Comparison: Previous exams.
COMPARISON: Previous exams.
COMPARISON: Previous exams.

Addendum:
CLINICAL DATA: 67-year-old female with indeterminate right breast
calcifications.

EXAM:
RIGHT BREAST STEREOTACTIC CORE NEEDLE BIOPSY

[R (1 of 7)]
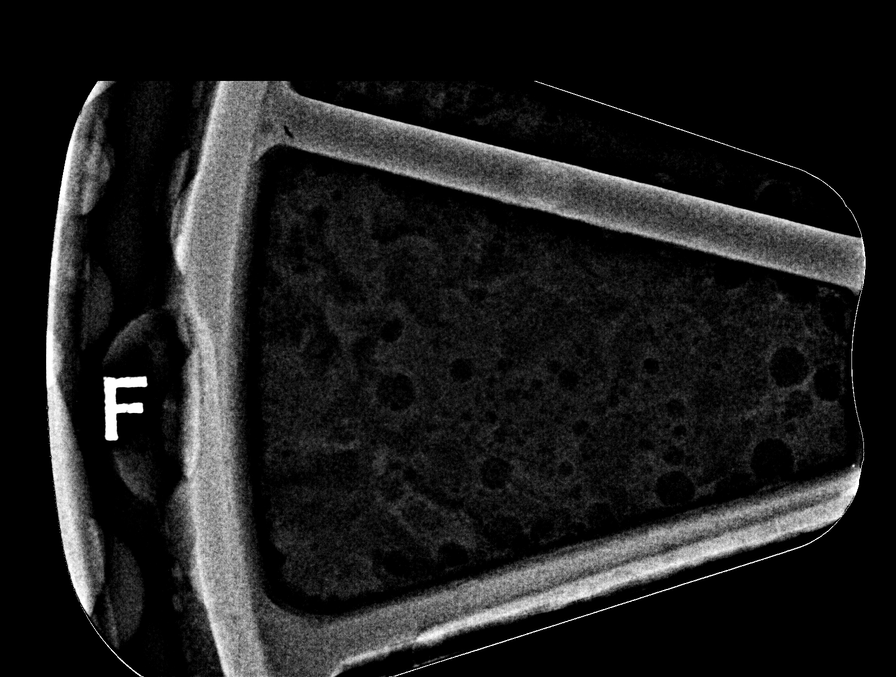

[R (2 of 7)]
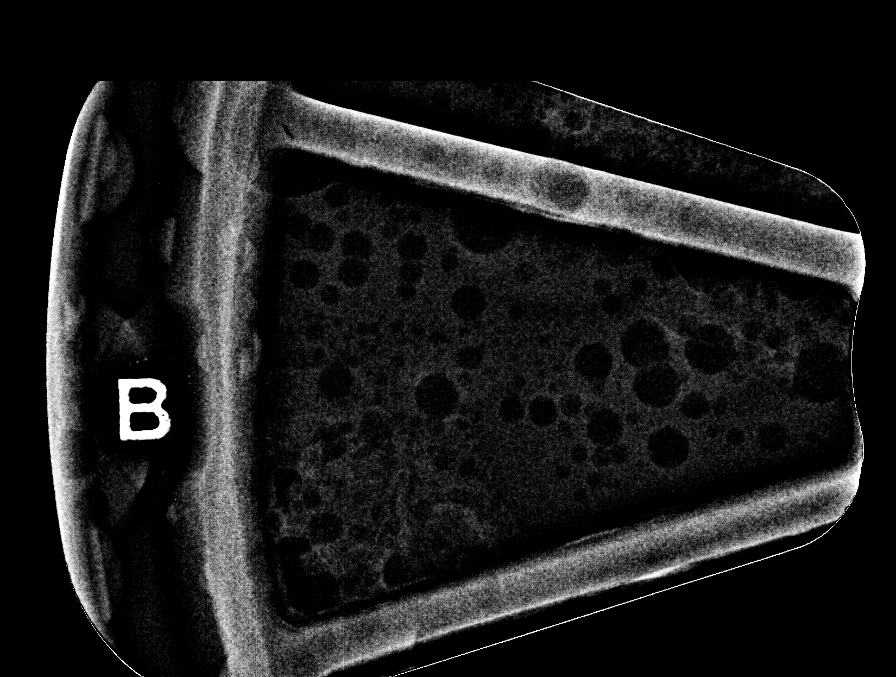

[R (3 of 7)]
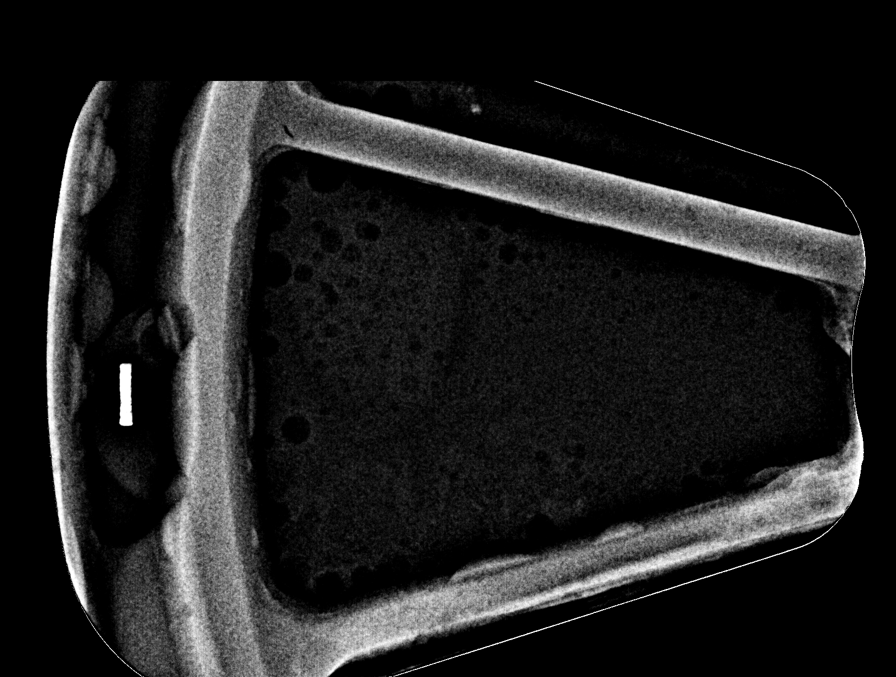

[R (4 of 7)]
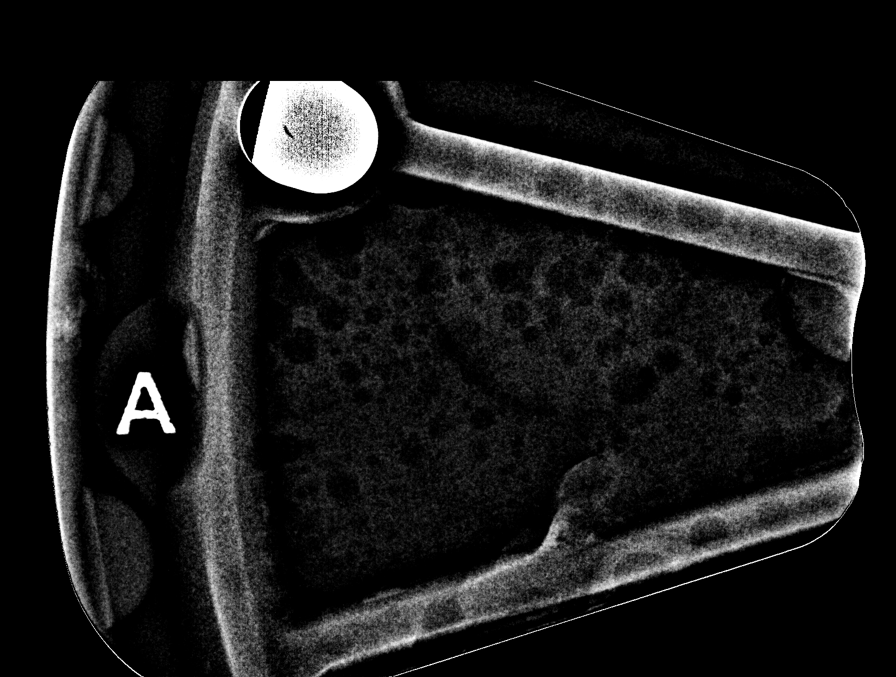

[R (5 of 7)]
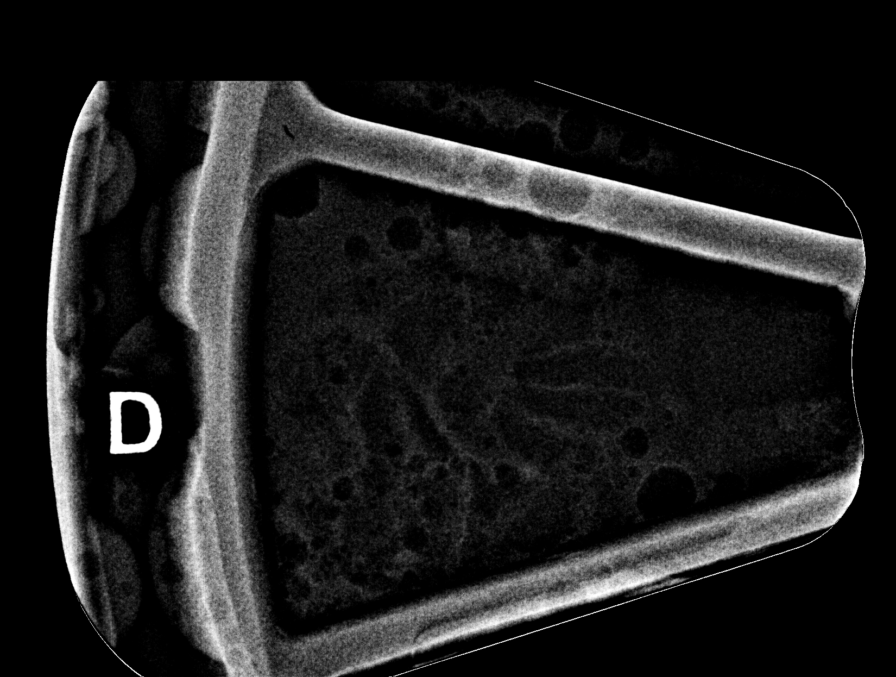

[R (6 of 7)]
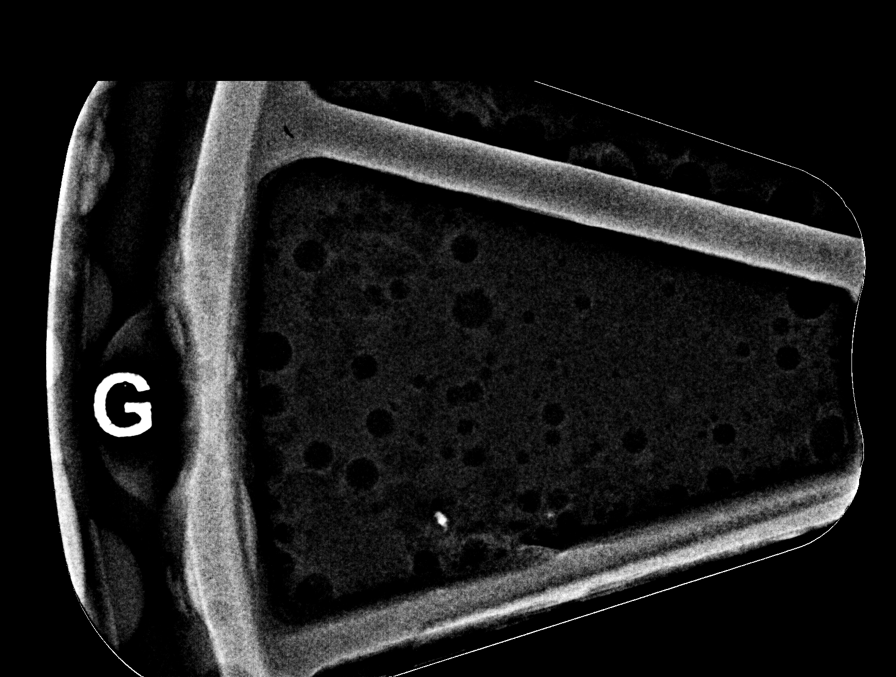

[R (7 of 7)]
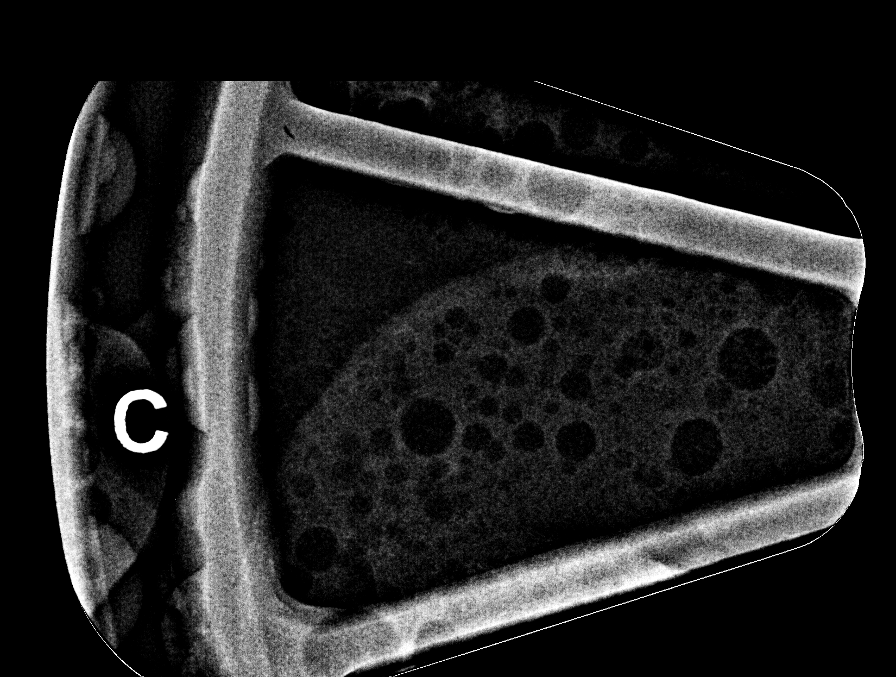

[7 of 32 positions shown; findings below may reference images not displayed]



Using sterile technique and 1% Lidocaine as local anesthetic, under
stereotactic guidance, a 9 gauge vacuum assisted device was used to
perform core needle biopsy of calcifications in the upper inner
quadrant of the right breast using a superior approach. Specimen
radiograph was performed showing scattered calcifications and a few
specimens. Specimens with calcifications are identified for
pathology.

Lesion quadrant: Upper inner quadrant

At the conclusion of the procedure, a coil shaped tissue marker clip
was deployed into the biopsy cavity. Follow-up 2-view mammogram was
performed and dictated separately.
IMPRESSION: Stereotactic-guided biopsy of the right breast. No apparent
complications.

ADDENDUM:
PATHOLOGY: Breast, right, needle core biopsy, UIQ posterior.
FIBROADENOMA WITH CALCIFICATIONS. THERE IS NO EVIDENCE OF
MALIGNANCY.

This was found to be concordant by Dr. Sorry in a truck 0.

I telephoned the patient on 06/29/2018 at 8999 and discussed these
results and the recommendations stated below. All questions were
answered. The patient denies significant pain or bleeding from the
biopsy site. Biopsy site care instructions were reviewed and the
patient was asked to call [REDACTED] with
any questions or issues related to the biopsy.

RECOMMENDATION: Return to bilateral screening mammogram in one year.

Addendum by Banik Circle RN on 06/29/2018.

ADDENDUM:
This addendum should read: This was found to be concordant by Dr.
Nazareth Jumper.

Addendum by Holton, Yuya

*** End of Addendum ***
Addendum:
FINDINGS: The patient and I discussed the procedure of stereotactic-guided
biopsy including benefits and alternatives. We discussed the high
likelihood of a successful procedure. We discussed the risks of the
procedure including infection, bleeding, tissue injury, clip
migration, and inadequate sampling. Informed written consent was
given. The usual time out protocol was performed immediately prior
to the procedure.

Using sterile technique and 1% Lidocaine as local anesthetic, under
stereotactic guidance, a 9 gauge vacuum assisted device was used to
perform core needle biopsy of calcifications in the upper inner
quadrant of the right breast using a superior approach. Specimen
radiograph was performed showing scattered calcifications and a few
specimens. Specimens with calcifications are identified for
pathology.

Lesion quadrant: Upper inner quadrant

At the conclusion of the procedure, a coil shaped tissue marker clip
was deployed into the biopsy cavity. Follow-up 2-view mammogram was
performed and dictated separately.
IMPRESSION: Stereotactic-guided biopsy of the right breast. No apparent
complications.

ADDENDUM:
PATHOLOGY: Breast, right, needle core biopsy, UIQ posterior.
FIBROADENOMA WITH CALCIFICATIONS. THERE IS NO EVIDENCE OF
MALIGNANCY.

This was found to be concordant by Dr. Sorry in a truck 0.

I telephoned the patient on 06/29/2018 at 8999 and discussed these
results and the recommendations stated below. All questions were
answered. The patient denies significant pain or bleeding from the
biopsy site. Biopsy site care instructions were reviewed and the
patient was asked to call [REDACTED] with
any questions or issues related to the biopsy.

RECOMMENDATION: Return to bilateral screening mammogram in one year.

Addendum by Banik Circle RN on 06/29/2018.



Using sterile technique and 1% Lidocaine as local anesthetic, under
stereotactic guidance, a 9 gauge vacuum assisted device was used to
perform core needle biopsy of calcifications in the upper inner
quadrant of the right breast using a superior approach. Specimen
radiograph was performed showing scattered calcifications and a few
specimens. Specimens with calcifications are identified for
pathology.

Lesion quadrant: Upper inner quadrant

At the conclusion of the procedure, a coil shaped tissue marker clip
was deployed into the biopsy cavity. Follow-up 2-view mammogram was
performed and dictated separately.
IMPRESSION: Stereotactic-guided biopsy of the right breast. No apparent
complications.

## 2020-07-02 ENCOUNTER — Ambulatory Visit (HOSPITAL_COMMUNITY)
Admission: RE | Admit: 2020-07-02 | Discharge: 2020-07-02 | Disposition: A | Discharge status: Home / Self Care | Payer: Medicare PPO | Source: Ambulatory Visit | Attending: Hematology | Admitting: Hematology

## 2020-07-02 ENCOUNTER — Inpatient Hospital Stay (HOSPITAL_COMMUNITY): Payer: Medicare PPO | Attending: Hematology

## 2020-07-02 ENCOUNTER — Other Ambulatory Visit: Payer: Self-pay

## 2020-07-02 DIAGNOSIS — Z170 Estrogen receptor positive status [ER+]: Secondary | ICD-10-CM

## 2020-07-02 DIAGNOSIS — Z1231 Encounter for screening mammogram for malignant neoplasm of breast: Secondary | ICD-10-CM | POA: Insufficient documentation

## 2020-07-02 DIAGNOSIS — I2699 Other pulmonary embolism without acute cor pulmonale: Secondary | ICD-10-CM | POA: Diagnosis not present

## 2020-07-02 DIAGNOSIS — C50311 Malignant neoplasm of lower-inner quadrant of right female breast: Secondary | ICD-10-CM | POA: Diagnosis not present

## 2020-07-02 DIAGNOSIS — E559 Vitamin D deficiency, unspecified: Secondary | ICD-10-CM | POA: Insufficient documentation

## 2020-07-02 DIAGNOSIS — Z853 Personal history of malignant neoplasm of breast: Secondary | ICD-10-CM | POA: Insufficient documentation

## 2020-07-02 DIAGNOSIS — D72829 Elevated white blood cell count, unspecified: Secondary | ICD-10-CM | POA: Diagnosis not present

## 2020-07-02 DIAGNOSIS — Z7901 Long term (current) use of anticoagulants: Secondary | ICD-10-CM | POA: Insufficient documentation

## 2020-07-02 LAB — VITAMIN D 25 HYDROXY (VIT D DEFICIENCY, FRACTURES): Vit D, 25-Hydroxy: 28.86 ng/mL — ABNORMAL LOW (ref 30–100)

## 2020-07-02 LAB — CBC WITH DIFFERENTIAL/PLATELET
Abs Immature Granulocytes: 0.04 10*3/uL (ref 0.00–0.07)
Basophils Absolute: 0 10*3/uL (ref 0.0–0.1)
Basophils Relative: 0 %
Eosinophils Absolute: 0.1 10*3/uL (ref 0.0–0.5)
Eosinophils Relative: 1 %
HCT: 40.8 % (ref 36.0–46.0)
Hemoglobin: 12.3 g/dL (ref 12.0–15.0)
Immature Granulocytes: 0 %
Lymphocytes Relative: 16 %
Lymphs Abs: 1.7 10*3/uL (ref 0.7–4.0)
MCH: 31 pg (ref 26.0–34.0)
MCHC: 30.1 g/dL (ref 30.0–36.0)
MCV: 102.8 fL — ABNORMAL HIGH (ref 80.0–100.0)
Monocytes Absolute: 0.6 10*3/uL (ref 0.1–1.0)
Monocytes Relative: 6 %
Neutro Abs: 8.1 10*3/uL — ABNORMAL HIGH (ref 1.7–7.7)
Neutrophils Relative %: 77 %
Platelets: 239 10*3/uL (ref 150–400)
RBC: 3.97 MIL/uL (ref 3.87–5.11)
RDW: 17.5 % — ABNORMAL HIGH (ref 11.5–15.5)
WBC: 10.6 10*3/uL — ABNORMAL HIGH (ref 4.0–10.5)
nRBC: 0 % (ref 0.0–0.2)

## 2020-07-02 LAB — COMPREHENSIVE METABOLIC PANEL
ALT: 26 U/L (ref 0–44)
AST: 34 U/L (ref 15–41)
Albumin: 3.2 g/dL — ABNORMAL LOW (ref 3.5–5.0)
Alkaline Phosphatase: 81 U/L (ref 38–126)
Anion gap: 13 (ref 5–15)
BUN: 16 mg/dL (ref 8–23)
CO2: 19 mmol/L — ABNORMAL LOW (ref 22–32)
Calcium: 8.7 mg/dL — ABNORMAL LOW (ref 8.9–10.3)
Chloride: 108 mmol/L (ref 98–111)
Creatinine, Ser: 1 mg/dL (ref 0.44–1.00)
GFR, Estimated: >60 mL/min (ref >60)
Glucose, Bld: 224 mg/dL — ABNORMAL HIGH (ref 70–99)
Potassium: 4.2 mmol/L (ref 3.5–5.1)
Sodium: 140 mmol/L (ref 135–145)
Total Bilirubin: 0.6 mg/dL (ref 0.3–1.2)
Total Protein: 6.7 g/dL (ref 6.5–8.1)

## 2020-07-03 ENCOUNTER — Other Ambulatory Visit (HOSPITAL_COMMUNITY): Payer: Self-pay | Admitting: Hematology

## 2020-07-03 DIAGNOSIS — R928 Other abnormal and inconclusive findings on diagnostic imaging of breast: Secondary | ICD-10-CM

## 2020-07-09 ENCOUNTER — Inpatient Hospital Stay (HOSPITAL_COMMUNITY): Payer: Medicare PPO | Admitting: Hematology

## 2020-07-11 ENCOUNTER — Emergency Department (HOSPITAL_COMMUNITY): Payer: Medicare PPO

## 2020-07-11 ENCOUNTER — Other Ambulatory Visit: Payer: Self-pay

## 2020-07-11 ENCOUNTER — Encounter (HOSPITAL_COMMUNITY): Payer: Self-pay | Admitting: Emergency Medicine

## 2020-07-11 ENCOUNTER — Emergency Department (HOSPITAL_COMMUNITY)
Admission: EM | Admit: 2020-07-11 | Discharge: 2020-07-11 | Disposition: A | Discharge status: Home / Self Care | Payer: Medicare PPO | Attending: Emergency Medicine | Admitting: Emergency Medicine

## 2020-07-11 DIAGNOSIS — R791 Abnormal coagulation profile: Secondary | ICD-10-CM

## 2020-07-11 DIAGNOSIS — I10 Essential (primary) hypertension: Secondary | ICD-10-CM | POA: Insufficient documentation

## 2020-07-11 DIAGNOSIS — R790 Abnormal level of blood mineral: Secondary | ICD-10-CM | POA: Diagnosis not present

## 2020-07-11 DIAGNOSIS — R609 Edema, unspecified: Secondary | ICD-10-CM | POA: Diagnosis not present

## 2020-07-11 DIAGNOSIS — Z8616 Personal history of COVID-19: Secondary | ICD-10-CM | POA: Diagnosis not present

## 2020-07-11 DIAGNOSIS — Z853 Personal history of malignant neoplasm of breast: Secondary | ICD-10-CM | POA: Diagnosis not present

## 2020-07-11 DIAGNOSIS — Z7984 Long term (current) use of oral hypoglycemic drugs: Secondary | ICD-10-CM | POA: Insufficient documentation

## 2020-07-11 DIAGNOSIS — Z87891 Personal history of nicotine dependence: Secondary | ICD-10-CM | POA: Insufficient documentation

## 2020-07-11 DIAGNOSIS — Z7901 Long term (current) use of anticoagulants: Secondary | ICD-10-CM | POA: Insufficient documentation

## 2020-07-11 DIAGNOSIS — R0602 Shortness of breath: Secondary | ICD-10-CM | POA: Insufficient documentation

## 2020-07-11 DIAGNOSIS — Z79899 Other long term (current) drug therapy: Secondary | ICD-10-CM | POA: Diagnosis not present

## 2020-07-11 DIAGNOSIS — E119 Type 2 diabetes mellitus without complications: Secondary | ICD-10-CM | POA: Diagnosis not present

## 2020-07-11 LAB — HEPATIC FUNCTION PANEL
ALT: 30 U/L (ref 0–44)
AST: 38 U/L (ref 15–41)
Albumin: 3.3 g/dL — ABNORMAL LOW (ref 3.5–5.0)
Alkaline Phosphatase: 111 U/L (ref 38–126)
Bilirubin, Direct: 0.2 mg/dL (ref 0.0–0.2)
Indirect Bilirubin: 0.5 mg/dL (ref 0.3–0.9)
Total Bilirubin: 0.7 mg/dL (ref 0.3–1.2)
Total Protein: 7.2 g/dL (ref 6.5–8.1)

## 2020-07-11 LAB — BASIC METABOLIC PANEL
Anion gap: 9 (ref 5–15)
BUN: 21 mg/dL (ref 8–23)
CO2: 23 mmol/L (ref 22–32)
Calcium: 8.8 mg/dL — ABNORMAL LOW (ref 8.9–10.3)
Chloride: 109 mmol/L (ref 98–111)
Creatinine, Ser: 1.02 mg/dL — ABNORMAL HIGH (ref 0.44–1.00)
GFR, Estimated: 59 mL/min — ABNORMAL LOW (ref >60)
Glucose, Bld: 137 mg/dL — ABNORMAL HIGH (ref 70–99)
Potassium: 3.9 mmol/L (ref 3.5–5.1)
Sodium: 141 mmol/L (ref 135–145)

## 2020-07-11 LAB — CBC
HCT: 40.7 % (ref 36.0–46.0)
Hemoglobin: 12.3 g/dL (ref 12.0–15.0)
MCH: 30.1 pg (ref 26.0–34.0)
MCHC: 30.2 g/dL (ref 30.0–36.0)
MCV: 99.5 fL (ref 80.0–100.0)
Platelets: 272 10*3/uL (ref 150–400)
RBC: 4.09 MIL/uL (ref 3.87–5.11)
RDW: 17.4 % — ABNORMAL HIGH (ref 11.5–15.5)
WBC: 10.4 10*3/uL (ref 4.0–10.5)
nRBC: 0.2 % (ref 0.0–0.2)

## 2020-07-11 LAB — BRAIN NATRIURETIC PEPTIDE: B Natriuretic Peptide: 467 pg/mL — ABNORMAL HIGH (ref 0.0–100.0)

## 2020-07-11 LAB — PROTIME-INR
INR: 8.3 (ref 0.8–1.2)
Prothrombin Time: 68.9 seconds — ABNORMAL HIGH (ref 11.4–15.2)

## 2020-07-11 LAB — TROPONIN I (HIGH SENSITIVITY): Troponin I (High Sensitivity): 16 ng/L (ref <18)

## 2020-07-11 MED ORDER — PHYTONADIONE 5 MG PO TABS
2.5000 mg | ORAL_TABLET | Freq: Once | ORAL | Status: AC
  Administered 2020-07-11: 2.5 mg via ORAL
  Filled 2020-07-11: qty 1

## 2020-07-11 NOTE — ED Provider Notes (Signed)
[facility] Provider Note   CSN: [id number] Arrival date & time: 07/11/20  1200     History CC:  INR high, SOB   Alexa Castillo is a 70 y.o. female with history of breast cancer, pulmonary embolism (most recently August 2020), on Coumadin, diabetes, emphysema (on 2 L as needed at home), presented to the ED with elevated INR.  She reports that she was checking her INR today and it was 8 in the morning.  Her PCPs office told her to come to the ER.  She reports that approximately a week ago they increase her Coumadin dose to 6 mg/day.  Prior to that she been alternating 5 and 6 mg doses every other day.  She had been on Xarelto in the past but had to stop that due to an allergic reaction.  She does report that she feels like she has had progressively worsening dyspnea, leg swelling for the past several weeks.  She does take 10 mg of Lasix daily and urinates frequently with.  She denies any chest pain or pressure.  HPI     Past Medical History:  Diagnosis Date   Arthritis    back/saw Dr. Aline Brochure   Breast cancer [facility])    Cancer [facility])    breast   Diabetes mellitus    Emphysema lung ([place])    Hypertension    Low back pain    Obesity    Pulmonary embolism [facility])    January 2017, August 2020   Sleep apnea 04/2013    Patient Active Problem List   Diagnosis Date Noted   Acute respiratory failure with hypoxia ([place])    COVID-19 virus infection 08/30/2018   Diabetes mellitus type II, non insulin dependent ([place]) 08/30/2018   AKI (acute kidney injury) ([place]) 08/30/2018   Prolonged QT interval 08/30/2018   Chronic joint pain 10/16/2015   Acute pulmonary embolism ([place]) 01/15/2015   Chest pain on breathing 01/15/2015   Dyspnea on exertion 01/15/2015   Troponin level elevated 01/15/2015   Arthritis    Hypertension    Essential hypertension    Pulmonary embolism ([place])    Obstructive sleep apnea syndrome in adult 07/20/2013   Weakness of right leg 03/11/2011    Left leg weakness 03/11/2011   Osteoarthritis of hip 03/08/2011   DDD (degenerative disc disease), lumbar 03/08/2011   Right breast cancer [id number] ER Positive 2012 07/18/2010    Past Surgical History:  Procedure Laterality Date   BIOPSY BREAST     TUBAL LIGATION       OB History     Gravida  3   Para  2   Term  2   Preterm      AB  1   Living  2      SAB  1   IAB      Ectopic      Multiple      Live Births              Family History  Problem Relation Age of Onset   Liver cancer Mother    Cancer Mother    Diabetes Mother    Breast cancer Sister    Cancer Sister     Social History   Tobacco Use   Smoking status: Former    Packs/day: 1.50    Years: 17.00    Pack years: 25.50    Types: Cigarettes    Quit date: 12/18/1994    Years since quitting: 25.5  Smokeless tobacco: Never  Vaping Use   Vaping Use: Never used  Substance Use Topics   Alcohol use: No   Drug use: No    Home Medications Prior to Admission medications   Medication Sig Start Date End Date Taking? Authorizing Provider  Accu-Chek FastClix Lancets MISC  09/07/18   [provider]  ACCU-CHEK GUIDE test strip  12/07/19   [provider]  acetaminophen (TYLENOL 8 HOUR ARTHRITIS PAIN) 650 MG CR tablet Take 1,300 mg by mouth 2 (two) times daily.    [provider]  Alcohol Swabs (B-D SINGLE USE SWABS REGULAR) PADS  09/07/18   [provider]  atorvastatin (LIPITOR) 40 MG tablet Take 40 mg by mouth every morning.  12/05/16   [provider]  EPINEPHrine 0.3 mg/0.3 mL IJ SOAJ injection Inject 0.3 mg into the muscle as needed.  02/26/19   [provider]  ergocalciferol (VITAMIN D2) 1.25 MG (50000 UT) capsule Take 1 capsule (50,000 Units total) by mouth once a week. 05/12/19   Lockamy, Randi L, NP-C  glipiZIDE (GLUCOTROL) 10 MG tablet Take 10 mg by mouth 2 (two) times daily before a meal.  06/18/10   [provider]  Lancets Misc.  ([place]) KIT  09/07/18   [provider]  letrozole ([place]) 2.5 MG tablet TAKE 1 TABLET EVERY DAY 04/21/19   Lockamy, Randi L, NP-C  metFORMIN (GLUCOPHAGE) 1000 MG tablet Take 1,000 mg by mouth 2 (two) times daily. 06/20/14   [provider]  metoprolol succinate (TOPROL-XL) 25 MG 24 hr tablet Take 25 mg by mouth every morning.  06/20/14   [provider]  warfarin (COUMADIN) 1 MG tablet Take 1 mg by mouth daily.  05/06/19   [provider]  warfarin (COUMADIN) 5 MG tablet Take 5 mg by mouth daily.  03/30/19   [provider]    Allergies    Lisinopril and Xarelto [rivaroxaban]  Review of Systems   Review of Systems  Constitutional:  Negative for chills and fever.  HENT:  Positive for ear pain.   Eyes:  Negative for pain and visual disturbance.  Respiratory:  Positive for shortness of breath. Negative for cough.   Cardiovascular:  Positive for leg swelling. Negative for chest pain and palpitations.  Gastrointestinal:  Negative for abdominal pain and vomiting.  Genitourinary:  Negative for dysuria and hematuria.  Musculoskeletal:  Negative for arthralgias and back pain.  Skin:  Negative for color change and rash.  Neurological:  Negative for syncope and light-headedness.  All other systems reviewed and are negative.  Physical Exam Updated Vital Signs BP 127/83   Pulse 88   Temp 98.2 F (36.8 C) (Oral)   Resp 20   Ht 5' 2" (1.575 m)   Wt 104.3 kg   SpO2 94%   BMI 42.07 kg/m   Physical Exam Constitutional:      General: She is not in acute distress. HENT:     Head: Normocephalic and atraumatic.  Eyes:     Conjunctiva/sclera: Conjunctivae normal.     Pupils: Pupils are equal, round, and reactive to light.  Cardiovascular:     Rate and Rhythm: Normal rate and regular rhythm.  Pulmonary:     Effort: Pulmonary effort is normal. No respiratory distress.  Abdominal:     General: There is no distension.     Tenderness:  There is no abdominal tenderness.  Musculoskeletal:        General: No swelling.  Right lower leg: Edema present.     Left lower leg: Edema present.  Skin:    General: Skin is warm and dry.  Neurological:     General: No focal deficit present.     Mental Status: She is alert and oriented to person, place, and time. Mental status is at baseline.    ED Results / Procedures / Treatments   Labs (all labs ordered are listed, but only abnormal results are displayed) Labs Reviewed  CBC - Abnormal; Notable for the following components:      Result Value   RDW 17.4 (*)    All other components within normal limits  BASIC METABOLIC PANEL - Abnormal; Notable for the following components:   Glucose, Bld 137 (*)    Creatinine, Ser 1.02 (*)    Calcium 8.8 (*)    GFR, Estimated 59 (*)    All other components within normal limits  HEPATIC FUNCTION PANEL - Abnormal; Notable for the following components:   Albumin 3.3 (*)    All other components within normal limits  PROTIME-INR - Abnormal; Notable for the following components:   Prothrombin Time 68.9 (*)    INR 8.3 (*)    All other components within normal limits  BRAIN NATRIURETIC PEPTIDE - Abnormal; Notable for the following components:   B Natriuretic Peptide 467.0 (*)    All other components within normal limits  TROPONIN I (HIGH SENSITIVITY)    EKG None  Radiology DG Chest Portable 1 View  Result Date: 07/11/2020 CLINICAL DATA:  Shortness of breath and cough. EXAM: PORTABLE CHEST 1 VIEW COMPARISON:  08/30/2018 FINDINGS: Moderate cardiomegaly and ectasia of thoracic aorta remains stable. Both lungs are clear. No evidence of pleural effusion. IMPRESSION: Stable cardiomegaly. No active lung disease. Electronically Signed   By: Marlaine Hind M.D.   On: 07/11/2020 13:06    Procedures Procedures   Medications Ordered in ED Medications  phytonadione (VITAMIN K) tablet 2.5 mg (2.5 mg Oral Given 07/11/20 1347)    ED Course  I have  reviewed the triage vital signs and the nursing notes.  Pertinent labs & imaging results that were available during my care of the patient were reviewed by me and considered in my medical decision making (see chart for details).  SOB - ddx includes congestive heart failure versus anemia versus ACS versus pneumonia versus pleural effusion versus other.  Elevated INR -likely related to medication adjustment recently - we'll recheck her level today as well as her hemoglobin.  There is no report of head injury to suspect intracranial bleed.  She does not report dark and tarry stools.  Clinically she does not appear to have acute anemia at this time.    Clinical Course as of 07/11/20 1700  Wed Jul 11, 2020  1243 Hemoglobin: 12.3 [MT]  1348 Will give PO K tablet 2.5 mg, advised she hold her coumadin for the next 2 days and recheck her INR each day.  She can resume on her old schedule when INR is back to 2.5.  Needs to f/u with PCP.  Also aadvised doubling lasix to 20 mg daily for next 7 days [MT]    Clinical Course User Index [MT] Jamie Hafford, Carola Rhine, MD    Final Clinical Impression(s) / ED Diagnoses Final diagnoses:  Elevated INR  Shortness of breath    Rx / DC Orders ED Discharge Orders     None        Wyvonnia Dusky, MD 07/11/20 1701

## 2020-07-11 NOTE — Discharge Instructions (Addendum)
Instructions:   Your warfarin level (INR) level was too high today (8.3).  We gave you a tablet in the ER to help bring this down.    I recommend that you hold your dose of Coumadin today and likely for the next two days.  Check your INR level each day.  Once your level is between 2-3, you should RESTART your coumadin on your old schedule.  This means 5 mg the first day, then 6 mg the next day, and alternating doses.  Follow up with your doctor's office for this issue - or if you have other questions about your doses.   Shortness of breath      - Double your water pill dose (lasix) to 20 mg each morning for the next 7 days.  This will help you pee some fluid out of your lungs.  Call your primary doctor to follow up for this visit.

## 2020-07-11 NOTE — ED Notes (Signed)
Pt verbalized understanding of discharge paperwork and follow-up care.  °

## 2020-07-11 NOTE — ED Triage Notes (Signed)
Pt states she was referred to the ED by her PCP for an INR of 8 on coumadin.

## 2020-07-15 ENCOUNTER — Emergency Department (HOSPITAL_COMMUNITY)
Admission: EM | Admit: 2020-07-15 | Discharge: 2020-07-15 | Disposition: A | Discharge status: Home / Self Care | Payer: Medicare PPO | Attending: Emergency Medicine | Admitting: Emergency Medicine

## 2020-07-15 ENCOUNTER — Encounter (HOSPITAL_COMMUNITY): Payer: Self-pay

## 2020-07-15 ENCOUNTER — Other Ambulatory Visit: Payer: Self-pay

## 2020-07-15 DIAGNOSIS — L03116 Cellulitis of left lower limb: Secondary | ICD-10-CM | POA: Diagnosis not present

## 2020-07-15 DIAGNOSIS — I10 Essential (primary) hypertension: Secondary | ICD-10-CM | POA: Diagnosis not present

## 2020-07-15 DIAGNOSIS — Z8616 Personal history of COVID-19: Secondary | ICD-10-CM | POA: Diagnosis not present

## 2020-07-15 DIAGNOSIS — Z7901 Long term (current) use of anticoagulants: Secondary | ICD-10-CM | POA: Diagnosis not present

## 2020-07-15 DIAGNOSIS — M79604 Pain in right leg: Secondary | ICD-10-CM | POA: Diagnosis present

## 2020-07-15 DIAGNOSIS — L03119 Cellulitis of unspecified part of limb: Secondary | ICD-10-CM

## 2020-07-15 DIAGNOSIS — Z79899 Other long term (current) drug therapy: Secondary | ICD-10-CM | POA: Diagnosis not present

## 2020-07-15 DIAGNOSIS — E119 Type 2 diabetes mellitus without complications: Secondary | ICD-10-CM | POA: Insufficient documentation

## 2020-07-15 DIAGNOSIS — L03115 Cellulitis of right lower limb: Secondary | ICD-10-CM | POA: Insufficient documentation

## 2020-07-15 DIAGNOSIS — Z87891 Personal history of nicotine dependence: Secondary | ICD-10-CM | POA: Diagnosis not present

## 2020-07-15 DIAGNOSIS — Z7984 Long term (current) use of oral hypoglycemic drugs: Secondary | ICD-10-CM | POA: Insufficient documentation

## 2020-07-15 MED ORDER — CEPHALEXIN 500 MG PO CAPS: 500.0000 mg | ORAL_CAPSULE | Freq: Four times a day (QID) | ORAL | 0 refills | Status: AC

## 2020-07-15 NOTE — Discharge Instructions (Addendum)
I suspect you may have early cellulitis of the lower extremities.  Starting you on antibiotics please take as prescribed.  Please keep the area clean, keep your legs elevated while not use.  Like you to follow-up with your PCP for further evaluation.  Also given contact info for a wound care clinic please call to schedule follow-up appointment.  Come back to the emergency department if you develop chest pain, shortness of breath, severe abdominal pain, uncontrolled nausea, vomiting, diarrhea.

## 2020-07-15 NOTE — ED Triage Notes (Signed)
Pt presents to ED with complaints of bilateral leg sores x 2 weeks, pt states has been draining. Denies fever

## 2020-07-15 NOTE — ED Provider Notes (Signed)
[facility] EMERGENCY DEPARTMENT Provider Note   CSN: [id number] Arrival date & time: 07/15/20  1758     History Chief Complaint  Patient presents with   Leg Pain    Alexa Castillo is a 70 y.o. female.  HPI  Patient with significant medical history of breast cancer, emphysema, hypertension, obesity, PEs currently on warfarin presents to the with chief complaint of wounds on her legs bilaterally.  Patient states she noted these wounds approximately 1 weeks ago, they came up spontaneously, states that they intermittently hurt, have clear discharge, she denies worsening leg swelling, calf pain, she denies paresthesia or weakness in her lower extremities, she denies chest pain, shortness of breath.  Patient states she is trying to get into wound clinic for further evaluation.  She has no other complaints at this time.  She does endorse fevers, chills, abdominal pain.  Past Medical History:  Diagnosis Date   Arthritis    back/saw Dr. Aline Brochure   Breast cancer [facility])    Cancer [facility])    breast   Diabetes mellitus    Emphysema lung ([place])    Hypertension    Low back pain    Obesity    Pulmonary embolism [facility])    January 2017, August 2020   Sleep apnea 04/2013    Patient Active Problem List   Diagnosis Date Noted   Acute respiratory failure with hypoxia ([place])    COVID-19 virus infection 08/30/2018   Diabetes mellitus type II, non insulin dependent ([place]) 08/30/2018   AKI (acute kidney injury) ([place]) 08/30/2018   Prolonged QT interval 08/30/2018   Chronic joint pain 10/16/2015   Acute pulmonary embolism ([place]) 01/15/2015   Chest pain on breathing 01/15/2015   Dyspnea on exertion 01/15/2015   Troponin level elevated 01/15/2015   Arthritis    Hypertension    Essential hypertension    Pulmonary embolism ([place])    Obstructive sleep apnea syndrome in adult 07/20/2013   Weakness of right leg 03/11/2011   Left leg weakness 03/11/2011   Osteoarthritis of hip 03/08/2011   DDD  (degenerative disc disease), lumbar 03/08/2011   Right breast cancer [id number] ER Positive 2012 07/18/2010    Past Surgical History:  Procedure Laterality Date   BIOPSY BREAST     TUBAL LIGATION       OB History     Gravida  3   Para  2   Term  2   Preterm      AB  1   Living  2      SAB  1   IAB      Ectopic      Multiple      Live Births              Family History  Problem Relation Age of Onset   Liver cancer Mother    Cancer Mother    Diabetes Mother    Breast cancer Sister    Cancer Sister     Social History   Tobacco Use   Smoking status: Former    Packs/day: 1.50    Years: 17.00    Pack years: 25.50    Types: Cigarettes    Quit date: 12/18/1994    Years since quitting: 25.5   Smokeless tobacco: Never  Vaping Use   Vaping Use: Never used  Substance Use Topics   Alcohol use: No   Drug use: No    Home Medications Prior to Admission medications   Medication Sig  Start Date End Date Taking? Authorizing Provider  cephALEXin (KEFLEX) 500 MG capsule Take 1 capsule (500 mg total) by mouth 4 (four) times daily for 7 days. 07/15/20 07/22/20 Yes Marcello Fennel, PA-C  Accu-Chek FastClix Lancets MISC  09/07/18   [provider]  ACCU-CHEK GUIDE test strip  12/07/19   [provider]  acetaminophen (TYLENOL 8 HOUR ARTHRITIS PAIN) 650 MG CR tablet Take 1,300 mg by mouth 2 (two) times daily.    [provider]  Alcohol Swabs (B-D SINGLE USE SWABS REGULAR) PADS  09/07/18   [provider]  atorvastatin (LIPITOR) 40 MG tablet Take 40 mg by mouth every morning.  12/05/16   [provider]  EPINEPHrine 0.3 mg/0.3 mL IJ SOAJ injection Inject 0.3 mg into the muscle as needed.  02/26/19   [provider]  ergocalciferol (VITAMIN D2) 1.25 MG (50000 UT) capsule Take 1 capsule (50,000 Units total) by mouth once a week. 05/12/19   Lockamy, Randi L, NP-C  glipiZIDE (GLUCOTROL) 10 MG tablet Take 10 mg by mouth 2  (two) times daily before a meal.  06/18/10   [provider]  Lancets Misc. ([place]) KIT  09/07/18   [provider]  letrozole ([place]) 2.5 MG tablet TAKE 1 TABLET EVERY DAY 04/21/19   Lockamy, Randi L, NP-C  metFORMIN (GLUCOPHAGE) 1000 MG tablet Take 1,000 mg by mouth 2 (two) times daily. 06/20/14   [provider]  metoprolol succinate (TOPROL-XL) 25 MG 24 hr tablet Take 25 mg by mouth every morning.  06/20/14   [provider]  warfarin (COUMADIN) 1 MG tablet Take 1 mg by mouth daily.  05/06/19   [provider]  warfarin (COUMADIN) 5 MG tablet Take 5 mg by mouth daily.  03/30/19   [provider]    Allergies    Lisinopril and Xarelto [rivaroxaban]  Review of Systems   Review of Systems  Constitutional:  Negative for chills and fever.  HENT:  Negative for congestion.   Respiratory:  Negative for shortness of breath.   Cardiovascular:  Negative for chest pain.  Gastrointestinal:  Negative for abdominal pain.  Genitourinary:  Negative for enuresis.  Musculoskeletal:  Negative for back pain.  Skin:  Positive for wound. Negative for rash.  Neurological:  Negative for headaches.  Hematological:  Does not bruise/bleed easily.   Physical Exam Updated Vital Signs BP (!) 128/92   Pulse 86   Temp 98.6 F (37 C) (Oral)   Resp 18   Ht _0  (1.575 m)   Wt 108.4 kg   SpO2 98%   BMI 43.71 kg/m   Physical Exam Vitals and nursing note reviewed.  Constitutional:      General: She is not in acute distress.    Appearance: She is not ill-appearing.  HENT:     Head: Normocephalic and atraumatic.     Nose: No congestion.  Eyes:     Conjunctiva/sclera: Conjunctivae normal.  Cardiovascular:     Rate and Rhythm: Normal rate and regular rhythm.     Pulses: Normal pulses.     Heart sounds: No murmur heard.   No friction rub. No gallop.  Pulmonary:     Effort: No respiratory distress.     Breath sounds: No wheezing, rhonchi  or rales.  Musculoskeletal:     Right lower leg: Edema present.     Left lower leg: Edema present.     Comments: Patient has full range of motion in her  lower extremities, she has bilateral 1+ edema up to the shins neurovascular fully intact.  Skin:    General: Skin is warm and dry.     Comments: Patient has chronic lymphedema skin changes, she has ulcers noted bilaterally on her lower extremities, mostly on the anterior and lateral aspect, with surrounding erythema, clear discharge present, slightly warm to the touch, no fluctuant indurations present.  Please pictures for more detail  Neurological:     Mental Status: She is alert.  Psychiatric:        Mood and Affect: Mood normal.         ED Results / Procedures / Treatments   Labs (all labs ordered are listed, but only abnormal results are displayed) Labs Reviewed - No data to display  EKG None  Radiology No results found.  Procedures Procedures   Medications Ordered in ED Medications - No data to display  ED Course  I have reviewed the triage vital signs and the nursing notes.  Pertinent labs & imaging results that were available during my care of the patient were reviewed by me and considered in my medical decision making (see chart for details).    MDM Rules/Calculators/A&P                         Initial impression-patient presents with bilateral wounds.  She is alert, does not appear in acute stress, vital signs reassuring.  Work-up-due to well-appearing patient, benign physical exam, further lab or imaging not warranted at this time.  Rule out-I have low suspicion for PE as patient denies pleuritic chest pain, worsening shortness of breath, there is no unilateral leg swelling, patient is currently on warfarin, vital signs reassuring.  Low suspicion for DVT as patient denies worsening calf pain or worsening leg swelling, there is no unilateral leg swelling present my exam.  Low suspicion for systemic infection  as patient is nontoxic-appearing, vital signs reassuring.  Plan-  Bilateral leg wounds-I suspect this is likely due to chronic lymphedema changes, suspect early cellulitis as there is surrounding erythema.  We will start her on antibiotics, have her follow-up with her PCP for further evaluation.  Vital signs have remained stable, no indication for hospital admission.  Patient discussed with attending and they agreed with assessment and plan.  Patient given at home care as well strict return precautions.  Patient verbalized that they understood agreed to said plan.  Final Clinical Impression(s) / ED Diagnoses Final diagnoses:  Cellulitis of lower extremity, unspecified laterality    Rx / DC Orders ED Discharge Orders          Ordered    cephALEXin (KEFLEX) 500 MG capsule  4 times daily        07/15/20 2128             Aron Baba 07/15/20 2129    Hayden Rasmussen, MD 07/16/20 1051

## 2020-07-17 ENCOUNTER — Other Ambulatory Visit: Payer: Self-pay

## 2020-07-17 ENCOUNTER — Ambulatory Visit (HOSPITAL_COMMUNITY)
Admission: RE | Admit: 2020-07-17 | Discharge: 2020-07-17 | Disposition: A | Discharge status: Home / Self Care | Payer: Medicare PPO | Source: Ambulatory Visit | Attending: Hematology | Admitting: Hematology

## 2020-07-17 DIAGNOSIS — R928 Other abnormal and inconclusive findings on diagnostic imaging of breast: Secondary | ICD-10-CM

## 2020-07-25 ENCOUNTER — Ambulatory Visit (HOSPITAL_COMMUNITY): Payer: Medicare PPO | Admitting: Hematology and Oncology

## 2020-07-30 NOTE — Progress Notes (Signed)
 [facility] [address] [place], [place] [address]   CLINIC:  Medical Oncology/Hematology  PCP:  Hunter, Denise, MD [address] / [place]  [address] [phone]   REASON FOR VISIT:  Follow-up for history of right breast cancer and history of pulmonary embolism  PRIOR THERAPY: Lumpectomy, radiation, anastrozole  CURRENT THERAPY: Letrozole  BRIEF ONCOLOGIC HISTORY:  Oncology History  Right breast cancer T1bN0M0 ER Positive 2012  07/18/2010 Initial Diagnosis   Right breast cancer T1bN0M0 ER Positive 2012    07/11/2015 Pathology Results   BCI- 7.3% risk of late recurrence (5-10 years) HIGH risk, HIGH liklihood of benefit from extended endocrine therapy, with 16.5% absolute benefit, 13.5% risk of overall recurrence (0-10 years) intermediate risk, with high likelihood of benefit.      CANCER STAGING: Cancer Staging No matching staging information was found for the patient.  INTERVAL HISTORY:  Alexa Castillo, a 70 y.o. female, returns for routine follow-up of her history of right breast cancer. Tyshana was last seen on 12/14/2019 by Dr. Katragadda.   At today's visit, she reports feeling fair.  She denies any recent hospitalizations or changes in her baseline health status.  She denies any symptoms of recurrence such as new lumps, bone pain, chest pain, new dyspnea, abdominal pain, persistent headaches, seizures, new neurologic deficits.  No B symptoms such as fever, chills, night sweats, unintentional weight loss.  She continues to take warfarin for history of pulmonary embolism.  She checks her INR at home.  She denies any symptoms of blood loss such as epistaxis, hematemesis, hematochezia, or melena.  No current signs or symptoms of acute DVT or PE.  Patient has been having some issues with blood flow in her legs, has also had some cellulitis and ulcers, just finished a course of Keflex yesterday.  She reports that her energy is 10% with 50%  appetite.  She is eating well to maintain a stable weight at this time.   REVIEW OF SYSTEMS:   Review of Systems  Constitutional:  Positive for fatigue. Negative for appetite change, chills, diaphoresis, fever and unexpected weight change.  HENT:   Negative for lump/mass and nosebleeds.   Eyes:  Negative for eye problems.  Respiratory:  Positive for cough (dry) and shortness of breath (with exertion). Negative for hemoptysis.   Cardiovascular:  Negative for chest pain, leg swelling and palpitations.  Gastrointestinal:  Positive for constipation and nausea (intermittent). Negative for abdominal pain, blood in stool, diarrhea and vomiting.  Genitourinary:  Negative for hematuria.   Musculoskeletal:  Positive for arthralgias and back pain.  Skin: Negative.   Neurological:  Negative for dizziness, headaches and light-headedness.  Hematological:  Does not bruise/bleed easily.  Psychiatric/Behavioral:  Positive for sleep disturbance.    PAST MEDICAL/SURGICAL HISTORY:  Past Medical History:  Diagnosis Date   Arthritis    back/saw Dr. Harrison   Breast cancer (HCC)    Cancer (HCC)    breast   Diabetes mellitus    Emphysema lung (HCC)    Hypertension    Low back pain    Obesity    Pulmonary embolism (HCC)    January 2017, August 2020   Sleep apnea 04/2013   Past Surgical History:  Procedure Laterality Date   BIOPSY BREAST     TUBAL LIGATION      SOCIAL HISTORY:  Social History   Socioeconomic History   Marital status: Single    Spouse name: Not on file     Number of children: Not on file   Years of education: Not on file   Highest education level: Not on file  Occupational History   Not on file  Tobacco Use   Smoking status: Former    Packs/day: 1.50    Years: 17.00    Pack years: 25.50    Types: Cigarettes    Quit date: 12/18/1994    Years since quitting: 25.6   Smokeless tobacco: Never  Vaping Use   Vaping Use: Never used  Substance and Sexual Activity   Alcohol  use: No   Drug use: No   Sexual activity: Not on file  Other Topics Concern   Not on file  Social History Narrative   Not on file   Social Determinants of Health   Financial Resource Strain: Low Risk    Difficulty of Paying Living Expenses: Not hard at all  Food Insecurity: No Food Insecurity   Worried About Running Out of Food in the Last Year: Never true   Ran Out of Food in the Last Year: Never true  Transportation Needs: No Transportation Needs   Lack of Transportation (Medical): No   Lack of Transportation (Non-Medical): No  Physical Activity: Inactive   Days of Exercise per Week: 0 days   Minutes of Exercise per Session: 0 min  Stress: No Stress Concern Present   Feeling of Stress : Not at all  Social Connections: Moderately Isolated   Frequency of Communication with Friends and Family: More than three times a week   Frequency of Social Gatherings with Friends and Family: More than three times a week   Attends Religious Services: More than 4 times per year   Active Member of Clubs or Organizations: No   Attends Club or Organization Meetings: Never   Marital Status: Never married  Intimate Partner Violence: Not At Risk   Fear of Current or Ex-Partner: No   Emotionally Abused: No   Physically Abused: No   Sexually Abused: No    FAMILY HISTORY:  Family History  Problem Relation Age of Onset   Liver cancer Mother    Cancer Mother    Diabetes Mother    Breast cancer Sister    Cancer Sister     CURRENT MEDICATIONS:  Current Outpatient Medications  Medication Sig Dispense Refill   Accu-Chek FastClix Lancets MISC      ACCU-CHEK GUIDE test strip      acetaminophen (TYLENOL 8 HOUR ARTHRITIS PAIN) 650 MG CR tablet Take 1,300 mg by mouth 2 (two) times daily.     Alcohol Swabs (B-D SINGLE USE SWABS REGULAR) PADS      atorvastatin (LIPITOR) 40 MG tablet Take 40 mg by mouth every morning.      EPINEPHrine 0.3 mg/0.3 mL IJ SOAJ injection Inject 0.3 mg into the muscle as  needed.      ergocalciferol (VITAMIN D2) 1.25 MG (50000 UT) capsule Take 1 capsule (50,000 Units total) by mouth once a week. 16 capsule 3   glipiZIDE (GLUCOTROL) 10 MG tablet Take 10 mg by mouth 2 (two) times daily before a meal.      Lancets Misc. (ACCU-CHEK FASTCLIX LANCET) KIT      letrozole (FEMARA) 2.5 MG tablet TAKE 1 TABLET EVERY DAY 90 tablet 3   metFORMIN (GLUCOPHAGE) 1000 MG tablet Take 1,000 mg by mouth 2 (two) times daily.  1   metoprolol succinate (TOPROL-XL) 25 MG 24 hr tablet Take 25 mg by mouth every morning.   1     warfarin (COUMADIN) 1 MG tablet Take 1 mg by mouth daily.      warfarin (COUMADIN) 5 MG tablet Take 5 mg by mouth daily.      No current facility-administered medications for this visit.    ALLERGIES:  Allergies  Allergen Reactions   Lisinopril Shortness Of Breath, Swelling, Cough and Other (See Comments)   Xarelto [Rivaroxaban] Rash    PHYSICAL EXAM:  Performance status (ECOG): 2 - Symptomatic, <50% confined to bed  There were no vitals filed for this visit. Wt Readings from Last 3 Encounters:  07/15/20 239 lb (108.4 kg)  07/11/20 230 lb (104.3 kg)  12/14/19 223 lb 9.6 oz (101.4 kg)   Physical Exam   LABORATORY DATA:  I have reviewed the labs as listed.  CBC Latest Ref Rng & Units 07/11/2020 07/02/2020 12/07/2019  WBC 4.0 - 10.5 K/uL 10.4 10.6(H) 12.5(H)  Hemoglobin 12.0 - 15.0 g/dL 12.3 12.3 13.4  Hematocrit 36.0 - 46.0 % 40.7 40.8 43.6  Platelets 150 - 400 K/uL 272 239 336   CMP Latest Ref Rng & Units 07/11/2020 07/02/2020 12/07/2019  Glucose 70 - 99 mg/dL 137(H) 224(H) 95  BUN 8 - 23 mg/dL 21 16 16  Creatinine 0.44 - 1.00 mg/dL 1.02(H) 1.00 0.99  Sodium 135 - 145 mmol/L 141 140 139  Potassium 3.5 - 5.1 mmol/L 3.9 4.2 4.3  Chloride 98 - 111 mmol/L 109 108 109  CO2 22 - 32 mmol/L 23 19(L) 19(L)  Calcium 8.9 - 10.3 mg/dL 8.8(L) 8.7(L) 9.4  Total Protein 6.5 - 8.1 g/dL 7.2 6.7 8.0  Total Bilirubin 0.3 - 1.2 mg/dL 0.7 0.6 0.6  Alkaline Phos 38  - 126 U/L 111 81 71  AST 15 - 41 U/L 38 34 26  ALT 0 - 44 U/L 30 26 31    DIAGNOSTIC IMAGING:  I have independently reviewed the scans and discussed with the patient. DG Chest Portable 1 View  Result Date: 07/11/2020 CLINICAL DATA:  Shortness of breath and cough. EXAM: PORTABLE CHEST 1 VIEW COMPARISON:  08/30/2018 FINDINGS: Moderate cardiomegaly and ectasia of thoracic aorta remains stable. Both lungs are clear. No evidence of pleural effusion. IMPRESSION: Stable cardiomegaly. No active lung disease. Electronically Signed   By: John A Stahl M.D.   On: 07/11/2020 13:06   MM DIAG BREAST TOMO UNI LEFT  Result Date: 07/17/2020 CLINICAL DATA:  Possible asymmetry in the posterior upper left breast in the oblique projection of a recent screening mammogram. EXAM: DIGITAL DIAGNOSTIC UNILATERAL LEFT MAMMOGRAM WITH TOMOSYNTHESIS AND CAD TECHNIQUE: Left digital diagnostic mammography and breast tomosynthesis was performed. The images were evaluated with computer-aided detection. COMPARISON:  Previous exam(s). ACR Breast Density Category b: There are scattered areas of fibroglandular density. FINDINGS: 3D tomographic and 2D generated true lateral and spot compression oblique images of the left breast demonstrate normal fibroglandular tissue and supporting ligaments in the posterior upper left breast at the location of the recently suspected asymmetry. IMPRESSION: No evidence of malignancy. The recently suspected left breast asymmetry was close apposition of normal breast tissue. RECOMMENDATION: Bilateral screening mammogram in 1 year. I have discussed the findings and recommendations with the patient. If applicable, a reminder letter will be sent to the patient regarding the next appointment. BI-RADS CATEGORY  1: Negative. Electronically Signed   By: Steven  Reid M.D.   On: 07/17/2020 14:40  MM 3D SCREEN BREAST BILATERAL  Result Date: 07/03/2020 CLINICAL DATA:  Screening. EXAM: DIGITAL SCREENING BILATERAL  MAMMOGRAM WITH TOMOSYNTHESIS AND CAD TECHNIQUE: Bilateral   screening digital craniocaudal and mediolateral oblique mammograms were obtained. Bilateral screening digital breast tomosynthesis was performed. The images were evaluated with computer-aided detection. COMPARISON:  Previous exam(s). ACR Breast Density Category a: The breast tissue is almost entirely fatty. FINDINGS: In the left breast, a possible asymmetry warrants further evaluation. In the right breast, no findings suspicious for malignancy. IMPRESSION: Further evaluation is suggested for possible asymmetry in the left breast. RECOMMENDATION: Diagnostic mammogram and possibly ultrasound of the left breast. (Code:[id number]) The patient will be contacted regarding the findings, and additional imaging will be scheduled. BI-RADS CATEGORY  0: Incomplete. Need additional imaging evaluation and/or prior mammograms for comparison. Electronically Signed   By: Michelle  Collins M.D.   On: 07/03/2020 09:00     ASSESSMENT & PLAN: 1.  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 -She underwent radiation therapy followed by anastrozole from November 2012 through end of 2017. -Due to arthralgias, it was changed to letrozole in February 2018. - Mammogram on 06/22/2018 shows right breast group of coarse heterogeneous calcifications in the central medial port.  This spans 1.3 x 8 x 5 mm.  Stereotactic biopsy of the right breast was recommended. -Right breast biopsy on 06/28/2018 suggestive of fibroadenoma. - Physical examination today did not reveal any palpable masses in bilateral breasts  - Review of labs (07/11/2020): CBC and CMP unremarkable - Most recent mammogram (07/17/2020): BI-RADS Category 1, negative - Patient accidentally stopped taking letrozole after her last visit with Dr. K, reports that she was unsure if she was supposed  to take it or not, so she stopped - Goal of aromatase inhibitor treatment is 10 years.  Patient has completed approximately 9 years of treatment (November 2012 through December 2021). - PLAN: Continue letrozole x1 year.  Discontinue in in July 2023.  Next mammogram due July 2023.  RTC for labs and follow-up in 1 year.    2.  Pulmonary embolism: -She had 1 previous pulmonary embolism in 2017, unprovoked, Xarelto discontinued in 2019. 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 -She was placed back on Xarelto, then switched to warfarin.  She checks INR at home. - Patient had episode of supratherapeutic INR with INR 8.3 on 07/11/2020.  She was seen in the ED for treatment of supratherapeutic INR.  PCP is monitoring warfarin dosing. -Patient will need to be on lifelong anticoagulation, or until such time that risks outweigh benefits. - PLAN: Continue warfarin.  Patient is aware of alarm symptoms that would prompt immediate medical attention.  3.  Leukocytosis: -She had intermittent leukocytosis.  It is stable. - Most recent WBC 10.4 (07/11/2020) with normal differential - PLAN: Repeat CBC at annual follow-up  4.  Bone mineral density: - DEXA scan on 06/16/2018 shows T score of 0.1 which was in the normal range. - For breast cancer associated bone loss she has been recommended to do weightbearing exercises and take calcium and vitamin D supplements - PLAN: Repeat bone density/DEXA scan in July 2023, prior to annual follow-up visit  5.   Vitamin D deficiency: - At last visit, patient was noted to have elevated vitamin D (94.54), and was changed from ergocalciferol 50,000 units weekly to cholecalciferol 5000 units daily - Most recent vitamin D (07/02/2020): Low at 28.86 - PLAN: Placed patient back on ergocalciferol 50,000 units every other week.   PLAN SUMMARY &   DISPOSITION: -Labs, mammogram, and bone density scan in 1  year - RTC for follow-up in 1 year  All questions were answered. The patient knows to call the clinic with any problems, questions or concerns.  Medical decision making: Moderate  Time spent on visit: I spent 20 minutes counseling the patient face to face. The total time spent in the appointment was 40 minutes and more than 50% was on counseling.   Harriett Rush, PA-C  07/31/2020 4:30 PM

## 2020-07-31 ENCOUNTER — Other Ambulatory Visit: Payer: Self-pay

## 2020-07-31 ENCOUNTER — Inpatient Hospital Stay (HOSPITAL_COMMUNITY): Payer: Medicare PPO | Attending: Hematology | Admitting: Physician Assistant

## 2020-07-31 VITALS — BP 140/89 | HR 88 | Temp 98.1°F | Resp 18 | Wt 221.0 lb

## 2020-07-31 DIAGNOSIS — Z87891 Personal history of nicotine dependence: Secondary | ICD-10-CM | POA: Diagnosis not present

## 2020-07-31 DIAGNOSIS — C50319 Malignant neoplasm of lower-inner quadrant of unspecified female breast: Secondary | ICD-10-CM

## 2020-07-31 DIAGNOSIS — Z86711 Personal history of pulmonary embolism: Secondary | ICD-10-CM | POA: Diagnosis not present

## 2020-07-31 DIAGNOSIS — E559 Vitamin D deficiency, unspecified: Secondary | ICD-10-CM

## 2020-07-31 DIAGNOSIS — C50911 Malignant neoplasm of unspecified site of right female breast: Secondary | ICD-10-CM | POA: Diagnosis present

## 2020-07-31 DIAGNOSIS — Z7901 Long term (current) use of anticoagulants: Secondary | ICD-10-CM | POA: Insufficient documentation

## 2020-07-31 DIAGNOSIS — Z79811 Long term (current) use of aromatase inhibitors: Secondary | ICD-10-CM | POA: Diagnosis not present

## 2020-07-31 DIAGNOSIS — D72829 Elevated white blood cell count, unspecified: Secondary | ICD-10-CM | POA: Insufficient documentation

## 2020-07-31 DIAGNOSIS — Z170 Estrogen receptor positive status [ER+]: Secondary | ICD-10-CM | POA: Diagnosis not present

## 2020-07-31 MED ORDER — ERGOCALCIFEROL 1.25 MG (50000 UT) PO CAPS
50000.0000 [IU] | ORAL_CAPSULE | ORAL | 3 refills | Status: AC
Start: 2020-07-31 — End: ?

## 2020-07-31 MED ORDER — ERGOCALCIFEROL 1.25 MG (50000 UT) PO CAPS: 50000.0000 [IU] | ORAL_CAPSULE | ORAL | 3 refills | Status: DC

## 2020-07-31 MED ORDER — LETROZOLE 2.5 MG PO TABS
2.5000 mg | ORAL_TABLET | Freq: Every day | ORAL | 3 refills | Status: AC
Start: 2020-07-31 — End: ?

## 2020-07-31 NOTE — Patient Instructions (Signed)
[place] at [facility] Discharge Instructions  You were seen today by Tarri Abernethy PA-C for your history of right-sided breast cancer.  LABS: Return in 1 year for follow-up labs  OTHER TESTS: Mammogram and bone density scan in July 2023  MEDICATIONS: - Restart vitamin D (ergocalciferol) 50,000 units EVERY OTHER WEEK - Take letrozole (Femara) for an additional 1 year, in order to complete 10 total years of treatment  FOLLOW-UP APPOINTMENT: Office visit in 1 year   Thank you for choosing [place] at [facility] to provide your oncology and hematology care.  To afford each patient quality time with our provider, please arrive at least 15 minutes before your scheduled appointment time.   If you have a lab appointment with the [facility] please come in thru the Main Entrance and check in at the main information desk.  You need to re-schedule your appointment should you arrive 10 or more minutes late.  We strive to give you quality time with our providers, and arriving late affects you and other patients whose appointments are after yours.  Also, if you no show three or more times for appointments you may be dismissed from the clinic at the providers discretion.     Again, thank you for choosing [facility].  Our hope is that these requests will decrease the amount of time that you wait before being seen by our physicians.       _____________________________________________________________  Should you have questions after your visit to [facility], please contact our office at [phone] and follow the prompts.  Our office hours are 8:00 a.m. and 4:30 p.m. Monday - Friday.  Please note that voicemails left after 4:00 p.m. may not be returned until the following business day.  We are closed weekends and major holidays.  You do have access to a nurse 24-7, just call the main number to the clinic [phone] and  do not press any options, hold on the line and a nurse will answer the phone.    For prescription refill requests, have your pharmacy contact our office and allow 72 hours.    Due to Covid, you will need to wear a mask upon entering the hospital. If you do not have a mask, a mask will be given to you at the Main Entrance upon arrival. For doctor visits, patients may have 1 support person age 55 or older with them. For treatment visits, patients can not have anyone with them due to social distancing guidelines and our immunocompromised population.

## 2020-08-09 ENCOUNTER — Other Ambulatory Visit: Payer: Self-pay

## 2020-08-09 ENCOUNTER — Inpatient Hospital Stay (HOSPITAL_COMMUNITY)
Admission: EM | Admit: 2020-08-09 | Discharge: 2020-08-12 | DRG: 760 | Disposition: A | Discharge status: Home / Self Care | Payer: Medicare PPO | Attending: Internal Medicine | Admitting: Internal Medicine

## 2020-08-09 ENCOUNTER — Encounter (HOSPITAL_COMMUNITY): Payer: Self-pay | Admitting: *Deleted

## 2020-08-09 DIAGNOSIS — Z87891 Personal history of nicotine dependence: Secondary | ICD-10-CM

## 2020-08-09 DIAGNOSIS — J9611 Chronic respiratory failure with hypoxia: Secondary | ICD-10-CM | POA: Diagnosis present

## 2020-08-09 DIAGNOSIS — G4733 Obstructive sleep apnea (adult) (pediatric): Secondary | ICD-10-CM | POA: Diagnosis present

## 2020-08-09 DIAGNOSIS — Z8616 Personal history of COVID-19: Secondary | ICD-10-CM

## 2020-08-09 DIAGNOSIS — D649 Anemia, unspecified: Secondary | ICD-10-CM

## 2020-08-09 DIAGNOSIS — Z833 Family history of diabetes mellitus: Secondary | ICD-10-CM

## 2020-08-09 DIAGNOSIS — Z9851 Tubal ligation status: Secondary | ICD-10-CM

## 2020-08-09 DIAGNOSIS — Z86711 Personal history of pulmonary embolism: Secondary | ICD-10-CM

## 2020-08-09 DIAGNOSIS — D62 Acute posthemorrhagic anemia: Secondary | ICD-10-CM | POA: Diagnosis present

## 2020-08-09 DIAGNOSIS — N939 Abnormal uterine and vaginal bleeding, unspecified: Secondary | ICD-10-CM

## 2020-08-09 DIAGNOSIS — D689 Coagulation defect, unspecified: Secondary | ICD-10-CM | POA: Diagnosis not present

## 2020-08-09 DIAGNOSIS — E119 Type 2 diabetes mellitus without complications: Secondary | ICD-10-CM

## 2020-08-09 DIAGNOSIS — Z7984 Long term (current) use of oral hypoglycemic drugs: Secondary | ICD-10-CM

## 2020-08-09 DIAGNOSIS — I5032 Chronic diastolic (congestive) heart failure: Secondary | ICD-10-CM | POA: Diagnosis present

## 2020-08-09 DIAGNOSIS — N950 Postmenopausal bleeding: Principal | ICD-10-CM | POA: Diagnosis present

## 2020-08-09 DIAGNOSIS — J439 Emphysema, unspecified: Secondary | ICD-10-CM | POA: Diagnosis present

## 2020-08-09 DIAGNOSIS — Z79899 Other long term (current) drug therapy: Secondary | ICD-10-CM

## 2020-08-09 DIAGNOSIS — I10 Essential (primary) hypertension: Secondary | ICD-10-CM | POA: Diagnosis present

## 2020-08-09 DIAGNOSIS — Z170 Estrogen receptor positive status [ER+]: Secondary | ICD-10-CM

## 2020-08-09 DIAGNOSIS — E1165 Type 2 diabetes mellitus with hyperglycemia: Secondary | ICD-10-CM | POA: Diagnosis present

## 2020-08-09 DIAGNOSIS — Z20822 Contact with and (suspected) exposure to covid-19: Secondary | ICD-10-CM | POA: Diagnosis present

## 2020-08-09 DIAGNOSIS — Z888 Allergy status to other drugs, medicaments and biological substances status: Secondary | ICD-10-CM

## 2020-08-09 DIAGNOSIS — C50911 Malignant neoplasm of unspecified site of right female breast: Secondary | ICD-10-CM | POA: Diagnosis present

## 2020-08-09 DIAGNOSIS — C50319 Malignant neoplasm of lower-inner quadrant of unspecified female breast: Secondary | ICD-10-CM | POA: Diagnosis present

## 2020-08-09 DIAGNOSIS — E785 Hyperlipidemia, unspecified: Secondary | ICD-10-CM | POA: Diagnosis present

## 2020-08-09 DIAGNOSIS — Z803 Family history of malignant neoplasm of breast: Secondary | ICD-10-CM

## 2020-08-09 DIAGNOSIS — I110 Hypertensive heart disease with heart failure: Secondary | ICD-10-CM | POA: Diagnosis present

## 2020-08-09 DIAGNOSIS — I2699 Other pulmonary embolism without acute cor pulmonale: Secondary | ICD-10-CM | POA: Diagnosis present

## 2020-08-09 DIAGNOSIS — R791 Abnormal coagulation profile: Secondary | ICD-10-CM

## 2020-08-09 NOTE — ED Triage Notes (Signed)
States she noted blood when she wipes after a hard stool for the past 2 days.

## 2020-08-10 ENCOUNTER — Inpatient Hospital Stay (HOSPITAL_COMMUNITY): Payer: Medicare PPO

## 2020-08-10 DIAGNOSIS — G4733 Obstructive sleep apnea (adult) (pediatric): Secondary | ICD-10-CM | POA: Diagnosis present

## 2020-08-10 DIAGNOSIS — E1165 Type 2 diabetes mellitus with hyperglycemia: Secondary | ICD-10-CM | POA: Diagnosis present

## 2020-08-10 DIAGNOSIS — Z8616 Personal history of COVID-19: Secondary | ICD-10-CM | POA: Diagnosis not present

## 2020-08-10 DIAGNOSIS — C50911 Malignant neoplasm of unspecified site of right female breast: Secondary | ICD-10-CM | POA: Diagnosis present

## 2020-08-10 DIAGNOSIS — J9611 Chronic respiratory failure with hypoxia: Secondary | ICD-10-CM | POA: Diagnosis present

## 2020-08-10 DIAGNOSIS — E119 Type 2 diabetes mellitus without complications: Secondary | ICD-10-CM

## 2020-08-10 DIAGNOSIS — D62 Acute posthemorrhagic anemia: Secondary | ICD-10-CM | POA: Diagnosis present

## 2020-08-10 DIAGNOSIS — Z9851 Tubal ligation status: Secondary | ICD-10-CM | POA: Diagnosis not present

## 2020-08-10 DIAGNOSIS — I5032 Chronic diastolic (congestive) heart failure: Secondary | ICD-10-CM | POA: Diagnosis present

## 2020-08-10 DIAGNOSIS — Z20822 Contact with and (suspected) exposure to covid-19: Secondary | ICD-10-CM | POA: Diagnosis present

## 2020-08-10 DIAGNOSIS — R791 Abnormal coagulation profile: Secondary | ICD-10-CM

## 2020-08-10 DIAGNOSIS — Z888 Allergy status to other drugs, medicaments and biological substances status: Secondary | ICD-10-CM | POA: Diagnosis not present

## 2020-08-10 DIAGNOSIS — Z833 Family history of diabetes mellitus: Secondary | ICD-10-CM | POA: Diagnosis not present

## 2020-08-10 DIAGNOSIS — Z803 Family history of malignant neoplasm of breast: Secondary | ICD-10-CM | POA: Diagnosis not present

## 2020-08-10 DIAGNOSIS — N950 Postmenopausal bleeding: Secondary | ICD-10-CM | POA: Diagnosis present

## 2020-08-10 DIAGNOSIS — I10 Essential (primary) hypertension: Secondary | ICD-10-CM | POA: Diagnosis not present

## 2020-08-10 DIAGNOSIS — Z7984 Long term (current) use of oral hypoglycemic drugs: Secondary | ICD-10-CM | POA: Diagnosis not present

## 2020-08-10 DIAGNOSIS — J439 Emphysema, unspecified: Secondary | ICD-10-CM | POA: Diagnosis present

## 2020-08-10 DIAGNOSIS — Z86711 Personal history of pulmonary embolism: Secondary | ICD-10-CM | POA: Diagnosis not present

## 2020-08-10 DIAGNOSIS — D689 Coagulation defect, unspecified: Secondary | ICD-10-CM | POA: Diagnosis present

## 2020-08-10 DIAGNOSIS — E785 Hyperlipidemia, unspecified: Secondary | ICD-10-CM | POA: Diagnosis present

## 2020-08-10 DIAGNOSIS — Z87891 Personal history of nicotine dependence: Secondary | ICD-10-CM | POA: Diagnosis not present

## 2020-08-10 DIAGNOSIS — Z79899 Other long term (current) drug therapy: Secondary | ICD-10-CM | POA: Diagnosis not present

## 2020-08-10 DIAGNOSIS — N939 Abnormal uterine and vaginal bleeding, unspecified: Secondary | ICD-10-CM | POA: Diagnosis not present

## 2020-08-10 DIAGNOSIS — I110 Hypertensive heart disease with heart failure: Secondary | ICD-10-CM | POA: Diagnosis present

## 2020-08-10 DIAGNOSIS — D649 Anemia, unspecified: Secondary | ICD-10-CM

## 2020-08-10 DIAGNOSIS — Z170 Estrogen receptor positive status [ER+]: Secondary | ICD-10-CM | POA: Diagnosis not present

## 2020-08-10 LAB — CBC WITH DIFFERENTIAL/PLATELET
Abs Immature Granulocytes: 0.03 10*3/uL (ref 0.00–0.07)
Basophils Absolute: 0 10*3/uL (ref 0.0–0.1)
Basophils Relative: 1 %
Eosinophils Absolute: 0.2 10*3/uL (ref 0.0–0.5)
Eosinophils Relative: 2 %
HCT: 35.3 % — ABNORMAL LOW (ref 36.0–46.0)
Hemoglobin: 10.7 g/dL — ABNORMAL LOW (ref 12.0–15.0)
Immature Granulocytes: 0 %
Lymphocytes Relative: 18 %
Lymphs Abs: 1.4 10*3/uL (ref 0.7–4.0)
MCH: 29.7 pg (ref 26.0–34.0)
MCHC: 30.3 g/dL (ref 30.0–36.0)
MCV: 98.1 fL (ref 80.0–100.0)
Monocytes Absolute: 0.7 10*3/uL (ref 0.1–1.0)
Monocytes Relative: 9 %
Neutro Abs: 5.5 10*3/uL (ref 1.7–7.7)
Neutrophils Relative %: 70 %
Platelets: 237 10*3/uL (ref 150–400)
RBC: 3.6 MIL/uL — ABNORMAL LOW (ref 3.87–5.11)
RDW: 18 % — ABNORMAL HIGH (ref 11.5–15.5)
WBC: 7.8 10*3/uL (ref 4.0–10.5)
nRBC: 0.3 % — ABNORMAL HIGH (ref 0.0–0.2)

## 2020-08-10 LAB — CBG MONITORING, ED
Glucose-Capillary: 130 mg/dL — ABNORMAL HIGH (ref 70–99)
Glucose-Capillary: 139 mg/dL — ABNORMAL HIGH (ref 70–99)

## 2020-08-10 LAB — TYPE AND SCREEN
ABO/RH(D): AB POS
Antibody Screen: NEGATIVE

## 2020-08-10 LAB — BASIC METABOLIC PANEL
Anion gap: 6 (ref 5–15)
BUN: 20 mg/dL (ref 8–23)
CO2: 22 mmol/L (ref 22–32)
Calcium: 8.6 mg/dL — ABNORMAL LOW (ref 8.9–10.3)
Chloride: 111 mmol/L (ref 98–111)
Creatinine, Ser: 1.06 mg/dL — ABNORMAL HIGH (ref 0.44–1.00)
GFR, Estimated: 57 mL/min — ABNORMAL LOW (ref >60)
Glucose, Bld: 96 mg/dL (ref 70–99)
Potassium: 4.1 mmol/L (ref 3.5–5.1)
Sodium: 139 mmol/L (ref 135–145)

## 2020-08-10 LAB — RESP PANEL BY RT-PCR (FLU A&B, COVID) ARPGX2
Influenza A by PCR: NEGATIVE
Influenza B by PCR: NEGATIVE
SARS Coronavirus 2 by RT PCR: NEGATIVE

## 2020-08-10 LAB — HEMOGLOBIN A1C
Hgb A1c MFr Bld: 7.9 % — ABNORMAL HIGH (ref 4.8–5.6)
Mean Plasma Glucose: 180.03 mg/dL

## 2020-08-10 LAB — GLUCOSE, CAPILLARY: Glucose-Capillary: 139 mg/dL — ABNORMAL HIGH (ref 70–99)

## 2020-08-10 LAB — PROTIME-INR
INR: 4.3 (ref 0.8–1.2)
Prothrombin Time: 41.6 seconds — ABNORMAL HIGH (ref 11.4–15.2)

## 2020-08-10 LAB — HIV ANTIBODY (ROUTINE TESTING W REFLEX): HIV Screen 4th Generation wRfx: NONREACTIVE

## 2020-08-10 MED ORDER — NYSTATIN 100000 UNIT/GM EX POWD
Freq: Two times a day (BID) | CUTANEOUS | Status: DC
  Filled 2020-08-10 (×2): qty 15

## 2020-08-10 MED ORDER — INSULIN ASPART 100 UNIT/ML IJ SOLN: 0.0000 [IU] | Freq: Every day | INTRAMUSCULAR | Status: DC

## 2020-08-10 MED ORDER — MEGESTROL ACETATE 40 MG PO TABS
40.0000 mg | ORAL_TABLET | Freq: Two times a day (BID) | ORAL | Status: DC
  Administered 2020-08-10 – 2020-08-12 (×6): 40 mg via ORAL
  Filled 2020-08-10 (×8): qty 1

## 2020-08-10 MED ORDER — FUROSEMIDE 10 MG/ML IJ SOLN
20.0000 mg | Freq: Once | INTRAMUSCULAR | Status: AC
  Administered 2020-08-10: 20 mg via INTRAVENOUS
  Filled 2020-08-10: qty 2

## 2020-08-10 MED ORDER — GUAIFENESIN-DM 100-10 MG/5ML PO SYRP
5.0000 mL | ORAL_SOLUTION | ORAL | Status: DC | PRN
  Administered 2020-08-10: 5 mL via ORAL
  Filled 2020-08-10: qty 5

## 2020-08-10 MED ORDER — ACETAMINOPHEN 325 MG PO TABS
650.0000 mg | ORAL_TABLET | Freq: Four times a day (QID) | ORAL | Status: DC | PRN
  Administered 2020-08-10 – 2020-08-12 (×4): 650 mg via ORAL
  Filled 2020-08-10 (×4): qty 2

## 2020-08-10 MED ORDER — LETROZOLE 2.5 MG PO TABS
2.5000 mg | ORAL_TABLET | Freq: Every day | ORAL | Status: DC
  Administered 2020-08-10 – 2020-08-12 (×3): 2.5 mg via ORAL
  Filled 2020-08-10 (×7): qty 1

## 2020-08-10 MED ORDER — INSULIN ASPART 100 UNIT/ML IJ SOLN
0.0000 [IU] | Freq: Three times a day (TID) | INTRAMUSCULAR | Status: DC
  Administered 2020-08-11 (×2): 1 [IU] via SUBCUTANEOUS
  Administered 2020-08-12: 2 [IU] via SUBCUTANEOUS
  Administered 2020-08-12: 1 [IU] via SUBCUTANEOUS
  Filled 2020-08-10: qty 1

## 2020-08-10 MED ORDER — ACETAMINOPHEN 650 MG RE SUPP: 650.0000 mg | Freq: Four times a day (QID) | RECTAL | Status: DC | PRN

## 2020-08-10 MED ORDER — ATORVASTATIN CALCIUM 40 MG PO TABS
40.0000 mg | ORAL_TABLET | Freq: Every morning | ORAL | Status: DC
  Administered 2020-08-10 – 2020-08-12 (×3): 40 mg via ORAL
  Filled 2020-08-10 (×3): qty 1

## 2020-08-10 MED ORDER — METOPROLOL SUCCINATE ER 25 MG PO TB24
25.0000 mg | ORAL_TABLET | Freq: Every morning | ORAL | Status: DC
  Administered 2020-08-10 – 2020-08-12 (×3): 25 mg via ORAL
  Filled 2020-08-10 (×3): qty 1

## 2020-08-10 MED ORDER — SODIUM CHLORIDE 0.9% IV SOLUTION: Freq: Once | INTRAVENOUS | Status: AC

## 2020-08-10 NOTE — Progress Notes (Signed)
ANTICOAGULATION CONSULT NOTE - Initial Consult  Pharmacy Consult for warfarin Indication: hx of PE  Allergies  Allergen Reactions   Lisinopril Shortness Of Breath, Swelling, Cough and Other (See Comments)   Xarelto [Rivaroxaban] Rash    Patient Measurements:   Heparin Dosing Weight:   Vital Signs: Temp: 97.8 F (36.6 C) (07/29 0736) Temp Source: Oral (07/29 0736) BP: 112/75 (07/29 0736) Pulse Rate: 92 (07/29 0736)  Labs: Recent Labs    08/10/20 0006  HGB 10.7*  HCT 35.3*  PLT 237  LABPROT 41.6*  INR 4.3*  CREATININE 1.06*    Estimated Creatinine Clearance: 54.7 mL/min (A) (by C-G formula based on SCr of 1.06 mg/dL (H)).   Medical History: Past Medical History:  Diagnosis Date   Arthritis    back/saw Dr. Aline Brochure   Breast cancer [facility])    Cancer [facility])    breast   Diabetes mellitus    Emphysema lung ([place])    Hypertension    Low back pain    Obesity    Pulmonary embolism [facility])    January 2017, August 2020   Sleep apnea 04/2013    Medications:  (Not in a hospital admission)   Assessment: Pharmacy consulted to dose warfarin in patient with history of PE from January 2017 and August 2020.  Patient with supratherapeutic INR of 4.3 on admission.  Hgb 10.7  Goal of Therapy:  INR 2-3 Monitor platelets by anticoagulation protocol: Yes   Plan:  Hold warfarin x 1 dose. Monitor daily INR and s/s of bleeding  Ramond Craver 08/10/2020,8:50 AM

## 2020-08-10 NOTE — Progress Notes (Addendum)
PROGRESS NOTE  Alexa Castillo [id number] DOB: November 30, 1950 DOA: 08/09/2020 PCP: Abran Richard, MD  Brief History:  70 year old female with a history of right-sided breast cancer, diabetes mellitus type 2, COPD, pulmonary emboli x2, OSA, hypertension presenting with what she felt to be rectal bleeding for 3 to 4 days.  She noted blood when she wiped.  However, upon evaluation in the emergency department it was noted that she did not have any rectal bleeding and there was blood in the patient's vaginal vault.  The patient denies any fevers, chills, chest pain, dizziness.  She has had some chronic shortness of breath which she states has been about the same as usual.  She denies any nausea, vomiting diarrhea, abdominal pain.  She has never had any vaginal bleeding in the past.  She has not seen her gynecologist for over 8 years.  She denies any coughing, hemoptysis, hematemesis, hematuria. In the emergency department, the patient was afebrile hemodynamically stable with oxygen saturation 100% on 2 L.  BMP was unremarkable with sodium 139, potassium 4.1, serum creatinine 1.06.  WBC 7.8, hemoglobin 10.7, platelets 237,000.  INR was 4.3.  GYN, Dr. Kennon Rounds was contacted, and recommended starting the patient on Megace.  Dr. Kennon Rounds will contact Dr. Rip Harbour to follow-up with the patient.  Assessment/Plan: Symptomatic anemia/acute blood loss anemia -Secondary to vaginal bleeding -Gynecology consulted--I spoke with Dr. Kennon Rounds -Continue Megace -pelvic ultrasound  Coagulopathy -Supratherapeutic INR -Give 1 unit FFP given the patient's active bleeding and drop in hemoglobin -Follow INR serially -The patient has been struggling with supratherapeutic INRs for the last several weeks  Chronic diastolic CHF -Continue furosemide -We will give a dose furosemide IV as she will be receiving FFP -Restart metoprolol succinate  Diabetes mellitus type 2, controlled -Check hemoglobin A1c -NovoLog sliding  scale -Holding glipizide and metformin -Holding Farxiga temporarily  Chronic respiratory failure with hypoxia -She is chronically on 2 L nasal cannula -Stable presently  Essential hypertension -Continue metoprolol succinate  History of pulmonary embolus -She has had PEs documented January 2017 and August 2020 with right heart strain -Continue warfarin  History of Breast Cancer -continue Letrozole  Hyperlipidemia -continue statin    Status is: Inpatient  Remains inpatient appropriate because:IV treatments appropriate due to intensity of illness or inability to take PO  Dispo: The patient is from: Home              Anticipated d/c is to: Home              Patient currently is not medically stable to d/c.   Difficult to place patient No        Family Communication:  no Family at bedside  Consultants:  GYN  Code Status:  FULL  DVT Prophylaxis:  warfarin   Procedures: As Listed in Progress Note Above  Antibiotics: None        Subjective: Patient denies fevers, chills, headache, chest pain, dyspnea, nausea, vomiting, diarrhea, abdominal pain, dysuria, hematuria, hematochezia, and melena.   Objective: Vitals:   08/10/20 0130 08/10/20 0200 08/10/20 0321 08/10/20 0736  BP: 132/85 125/69  112/75  Pulse: 80 82  92  Resp: '18 17  18  '$ Temp:    97.8 F (36.6 C)  TempSrc:    Oral  SpO2: 100% 100% 100% 96%   No intake or output data in the 24 hours ending 08/10/20 0740 Weight change:  Exam:  General:  Pt is alert, follows commands  appropriately, not in acute distress HEENT: No icterus, No thrush, No neck mass, [place]/AT Cardiovascular: RRR, S1/S2, no rubs, no gallops Respiratory: CTA bilaterally, no wheezing, no crackles, no rhonchi Abdomen: Soft/+BS, non tender, non distended, no guarding Extremities: 2 +LE edema, No lymphangitis, No petechiae, No rashes, no synovitis   Data Reviewed: I have personally reviewed following labs and imaging  studies Basic Metabolic Panel: Recent Labs  Lab 08/10/20 0006  NA 139  K 4.1  CL 111  CO2 22  GLUCOSE 96  BUN 20  CREATININE 1.06*  CALCIUM 8.6*   Liver Function Tests: No results for input(s): AST, ALT, ALKPHOS, BILITOT, PROT, ALBUMIN in the last 168 hours. No results for input(s): LIPASE, AMYLASE in the last 168 hours. No results for input(s): AMMONIA in the last 168 hours. Coagulation Profile: Recent Labs  Lab 08/10/20 0006  INR 4.3*   CBC: Recent Labs  Lab 08/10/20 0006  WBC 7.8  NEUTROABS 5.5  HGB 10.7*  HCT 35.3*  MCV 98.1  PLT 237   Cardiac Enzymes: No results for input(s): CKTOTAL, CKMB, CKMBINDEX, TROPONINI in the last 168 hours. BNP: Invalid input(s): POCBNP CBG: No results for input(s): GLUCAP in the last 168 hours. HbA1C: No results for input(s): HGBA1C in the last 72 hours. Urine analysis:    Component Value Date/Time   COLORURINE YELLOW 03/03/2015 0010   APPEARANCEUR CLEAR 03/03/2015 0010   LABSPEC 1.010 03/03/2015 0010   PHURINE 5.5 03/03/2015 0010   GLUCOSEU >1000 (A) 03/03/2015 0010   HGBUR SMALL (A) 03/03/2015 0010   BILIRUBINUR NEGATIVE 03/03/2015 0010   KETONESUR 15 (A) 03/03/2015 0010   PROTEINUR NEGATIVE 03/03/2015 0010   NITRITE POSITIVE (A) 03/03/2015 0010   LEUKOCYTESUR NEGATIVE 03/03/2015 0010   Sepsis Labs: '@LABRCNTIP'$ (procalcitonin:4,lacticidven:4) ) Recent Results (from the past 240 hour(s))  Resp Panel by RT-PCR (Flu A&B, Covid) Nasopharyngeal Swab     Status: None   Collection Time: 08/10/20  2:07 AM   Specimen: Nasopharyngeal Swab; Nasopharyngeal(NP) swabs in vial transport medium  Result Value Ref Range Status   SARS Coronavirus 2 by RT PCR NEGATIVE NEGATIVE Final    Comment: (NOTE) SARS-CoV-2 target nucleic acids are NOT DETECTED.  The SARS-CoV-2 RNA is generally detectable in upper respiratory specimens during the acute phase of infection. The lowest concentration of SARS-CoV-2 viral copies this assay can detect  is 138 copies/mL. A negative result does not preclude SARS-Cov-2 infection and should not be used as the sole basis for treatment or other patient management decisions. A negative result may occur with  improper specimen collection/handling, submission of specimen other than nasopharyngeal swab, presence of viral mutation(s) within the areas targeted by this assay, and inadequate number of viral copies(<138 copies/mL). A negative result must be combined with clinical observations, patient history, and epidemiological information. The expected result is Negative.  Fact Sheet for Patients:  EntrepreneurPulse.com.au  Fact Sheet for Healthcare Providers:  IncredibleEmployment.be  This test is no t yet approved or cleared by the [place] FDA and  has been authorized for detection and/or diagnosis of SARS-CoV-2 by FDA under an Emergency Use Authorization (EUA). This EUA will remain  in effect (meaning this test can be used) for the duration of the COVID-19 declaration under Section 564(b)(1) of the Act, 21 U.S.C.section 360bbb-3(b)(1), unless the authorization is terminated  or revoked sooner.       Influenza A by PCR NEGATIVE NEGATIVE Final   Influenza B by PCR NEGATIVE NEGATIVE Final    Comment: (NOTE) The Xpert  Xpress SARS-CoV-2/FLU/RSV plus assay is intended as an aid in the diagnosis of influenza from Nasopharyngeal swab specimens and should not be used as a sole basis for treatment. Nasal washings and aspirates are unacceptable for Xpert Xpress SARS-CoV-2/FLU/RSV testing.  Fact Sheet for Patients: EntrepreneurPulse.com.au  Fact Sheet for Healthcare Providers: IncredibleEmployment.be  This test is not yet approved or cleared by the [place] FDA and has been authorized for detection and/or diagnosis of SARS-CoV-2 by FDA under an Emergency Use Authorization (EUA). This EUA will remain in effect  (meaning this test can be used) for the duration of the COVID-19 declaration under Section 564(b)(1) of the Act, 21 U.S.C. section 360bbb-3(b)(1), unless the authorization is terminated or revoked.  Performed at [facility], [address], [place], [place] [address]      Scheduled Meds:  insulin aspart  0-5 Units Subcutaneous QHS   insulin aspart  0-9 Units Subcutaneous TID WC   megestrol  40 mg Oral BID   Continuous Infusions:  Procedures/Studies: DG Chest Portable 1 View  Result Date: 07/11/2020 CLINICAL DATA:  Shortness of breath and cough. EXAM: PORTABLE CHEST 1 VIEW COMPARISON:  08/30/2018 FINDINGS: Moderate cardiomegaly and ectasia of thoracic aorta remains stable. Both lungs are clear. No evidence of pleural effusion. IMPRESSION: Stable cardiomegaly. No active lung disease. Electronically Signed   By: Marlaine Hind M.D.   On: 07/11/2020 13:06   MM DIAG BREAST TOMO UNI LEFT  Result Date: 07/17/2020 CLINICAL DATA:  Possible asymmetry in the posterior upper left breast in the oblique projection of a recent screening mammogram. EXAM: DIGITAL DIAGNOSTIC UNILATERAL LEFT MAMMOGRAM WITH TOMOSYNTHESIS AND CAD TECHNIQUE: Left digital diagnostic mammography and breast tomosynthesis was performed. The images were evaluated with computer-aided detection. COMPARISON:  Previous exam(s). ACR Breast Density Category b: There are scattered areas of fibroglandular density. FINDINGS: 3D tomographic and 2D generated true lateral and spot compression oblique images of the left breast demonstrate normal fibroglandular tissue and supporting ligaments in the posterior upper left breast at the location of the recently suspected asymmetry. IMPRESSION: No evidence of malignancy. The recently suspected left breast asymmetry was close apposition of normal breast tissue. RECOMMENDATION: Bilateral screening mammogram in 1 year. I have discussed the findings and recommendations with the patient. If applicable, a  reminder letter will be sent to the patient regarding the next appointment. BI-RADS CATEGORY  1: Negative. Electronically Signed   By: Claudie Revering M.D.   On: 07/17/2020 14:40   Orson Eva, DO  [facility]  If 7PM-7AM, please contact night-coverage [email or web address] Password [email or web address] 08/10/2020, 7:40 AM   LOS: 0 days

## 2020-08-10 NOTE — ED Notes (Signed)
Swelling noted to legs bilaterally.  Dressing are intact.  Pt is requesting to sit in recliner.

## 2020-08-10 NOTE — H&P (Signed)
History and Physical  Alexa Castillo RJJ:[id number] DOB: 1950/08/07 DOA: 08/09/2020  Referring physician: Ripley Fraise, MD PCP: Abran Richard, MD  Patient coming from: Home  Chief Complaint: Rectal bleeding  HPI: Alexa Castillo is a 70 y.o. 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   ED Course:  In the emergency department, she was hemodynamically stable.  Work-up in the ED showed normocytic anemia and normal BMP, INR was elevated at 4.3.  Influenza A, B, SARS coronavirus 2 was negative. Gynecology on call (Dr. Kennon Rounds) was consulted by ED physician and recommended 40 mg Megace twice daily and to do pelvic ultrasound in the morning.  Hospitalist was asked to admit patient for further evaluation management  Review of Systems: Constitutional: Positive for fatigue.  Negative for chills and fever.  HENT: Negative for ear pain and sore throat.   Eyes: Negative for pain and visual disturbance.  Respiratory: Negative for cough, chest tightness and shortness of breath.   Cardiovascular: Negative for chest pain and palpitations.  Gastrointestinal: Positive for hematochezia.  Negative for abdominal pain and vomiting.  Endocrine: Negative for polyphagia and polyuria.  Genitourinary: Negative for decreased urine volume, dysuria, enuresis Musculoskeletal: Negative for arthralgias and back pain.  Skin: Negative for color change and rash.  Allergic/Immunologic: Negative for immunocompromised state.  Neurological: Negative for tremors, syncope, speech  difficulty Hematological: Does not bruise/bleed easily.  All other systems reviewed and are negative   Past Medical History:  Diagnosis Date   Arthritis    back/saw Dr. Aline Brochure   Breast cancer [facility])    Cancer [facility])    breast   Diabetes mellitus    Emphysema lung ([place])    Hypertension    Low back pain    Obesity    Pulmonary embolism [facility])    January 2017, August 2020   Sleep apnea 04/2013   Past Surgical History:  Procedure Laterality Date   BIOPSY BREAST     TUBAL LIGATION      Social History:  reports that she quit smoking about 25 years ago. Her smoking use included cigarettes. She has a 25.50 pack-year smoking history. She has never used smokeless tobacco. She reports that she does not drink alcohol and does not use drugs.   Allergies  Allergen Reactions   Lisinopril Shortness Of Breath, Swelling, Cough and Other (See Comments)   Xarelto [Rivaroxaban] Rash    Family History  Problem Relation Age of Onset   Liver cancer Mother    Cancer Mother    Diabetes Mother    Breast cancer Sister    Cancer Sister      Prior to Admission medications   Medication Sig Start Date End Date Taking? Authorizing Provider  Accu-Chek FastClix Lancets MISC  09/07/18   [provider]  ACCU-CHEK GUIDE test strip  12/07/19   [provider]  acetaminophen (TYLENOL) 650 MG CR tablet Take 1,300 mg by mouth 2 (two) times daily.    [provider]  Alcohol Swabs (B-D SINGLE USE SWABS REGULAR) PADS  09/07/18   [provider]  atorvastatin (LIPITOR) 40 MG tablet Take 40 mg by mouth every morning.  12/05/16   [provider]  dapagliflozin propanediol (FARXIGA) 10 MG TABS tablet Take 10 mg by mouth daily.    [provider]  EPINEPHrine 0.3 mg/0.3 mL IJ SOAJ injection Inject 0.3 mg into the muscle as needed.  02/26/19   [provider]  ergocalciferol (VITAMIN D2) 1.25 MG (50000 UT) capsule Take 1 capsule (50,000 Units total) by  mouth once a week. Take every other week 07/31/20   Harriett Rush, PA-C  glipiZIDE (GLUCOTROL) 10 MG tablet Take 10 mg by mouth 2 (two) times daily before a meal.  06/18/10   [provider]  Lancets Misc. (ACCU-CHEK FASTCLIX LANCET) KIT  09/07/18   [provider]  letrozole (FEMARA) 2.5 MG tablet Take 1 tablet (2.5 mg total) by mouth daily. 07/31/20   Harriett Rush, PA-C  metFORMIN (GLUCOPHAGE) 1000 MG tablet Take 1,000 mg by mouth 2 (two) times daily. 06/20/14   [provider]  metoprolol succinate (TOPROL-XL) 25 MG 24 hr tablet Take 25 mg by mouth every morning.  06/20/14   [provider]  warfarin (COUMADIN) 1 MG tablet Take 1 mg by mouth daily.  05/06/19   [provider]  warfarin (COUMADIN) 5 MG tablet Take 5 mg by mouth daily.  03/30/19   [provider]    Physical Exam: BP 125/69   Pulse 82   Temp 98.9 F (37.2 C) (Oral)   Resp 17   SpO2 100%   General: 70 y.o. year-old female well developed well nourished in no acute distress.  Alert and oriented x3. HEENT: NCAT, EOMI Neck: Supple, trachea medial Cardiovascular: Regular rate and rhythm with no rubs or gallops.  No thyromegaly or JVD noted.  No lower extremity edema. 2/4 pulses in all 4 extremities. Respiratory: Clear to auscultation with no wheezes or rales. Good inspiratory effort. Abdomen: Soft, nontender nondistended with normal bowel sounds x4 quadrants. Muskuloskeletal: No cyanosis, clubbing or edema noted bilaterally Neuro: CN II-XII intact, strength 5/5 x 4, sensation, reflexes intact Skin: No ulcerative lesions noted or rashes Psychiatry: Mood is appropriate for condition and setting          Labs on Admission:  Basic Metabolic Panel: Recent Labs  Lab 08/10/20 0006  NA 139  K 4.1  CL 111  CO2 22  GLUCOSE 96  BUN 20  CREATININE 1.06*  CALCIUM 8.6*   Liver Function Tests: No results for input(s): AST, ALT, ALKPHOS, BILITOT, PROT, ALBUMIN in  the last 168 hours. No results for input(s): LIPASE, AMYLASE in the last 168 hours. No results for input(s): AMMONIA in the last 168 hours. CBC: Recent Labs  Lab 08/10/20 0006  WBC 7.8  NEUTROABS 5.5  HGB 10.7*  HCT 35.3*  MCV 98.1  PLT 237   Cardiac Enzymes: No results for input(s): CKTOTAL, CKMB, CKMBINDEX, TROPONINI in the last 168 hours.  BNP (last 3 results) Recent Labs    07/11/20 1231  BNP 467.0*    ProBNP (last 3 results) No results for input(s): PROBNP in the last 8760 hours.  CBG: No results for input(s): GLUCAP in the last 168 hours.  Radiological Exams on Admission: No results found.  EKG: I independently viewed the EKG done and my findings are as followed: EKG was not done in the ED  Assessment/Plan Present on Admission:  Vaginal bleeding  Essential hypertension  Pulmonary embolism ([place])  Right breast cancer T1bN0M0 ER Positive 2012  Principal Problem:   Vaginal bleeding Active Problems:   Right breast cancer T1bN0M0 ER Positive 2012   Essential hypertension   Pulmonary embolism ([facility])   Diabetes mellitus type II, non insulin dependent ([facility])   Supratherapeutic INR   Chronic respiratory failure with hypoxia ([facility])   Symptomatic anemia  Symptomatic anemia possibly secondary to vaginal bleeding Blood noted in the vaginal vault per ED physician H/H 10.7/35.3, this was 12.3/40.7 on 07/11/2020 The cause of vaginal bleeding currently unknown, though, patient supratherapeutic INR Gynecologist was consulted by ED physician and recommended Megace Pelvic ultrasound will be done in the morning Gynecology will be consulted and we shall await further recommendation  Supratherapeutic INR INR 4.3, warfarin will be held at this time Continue to monitor INR  Pulmonary embolism Warfarin currently held due to supratherapeutic INR and vaginal bleed  Chronic respiratory failure with hypoxia Continue supplemental oxygen per home regimen  Essential  hypertension (controlled) Continue home Toprol- XL  Hyperlipidemia Continue Lipitor  T2DM Continue ISS and hypoglycemia protocol Farxiga, glipizide admitted following will be held at this time  History of right breast cancer Continue letrozole, patient follows with Dr. Delton Coombes   DVT prophylaxis: SCDs  Code Status: Full code  Family Communication: None at bedside  Disposition Plan:  Patient is from:                        home Anticipated DC to:                   SNF or family members home Anticipated DC date:               2-3 days Anticipated DC barriers:         patient requires inpatient management due to symptomatic anemia secondary to vaginal bleeding pending gynecology consult and supratherapeutic INR  Consults called: Gynecology  Admission status: Inpatient    Bernadette Hoit MD [facility]  08/10/2020, 6:55 AM

## 2020-08-10 NOTE — ED Notes (Signed)
Date and time results received: 08/10/20 0105 Test: INR Critical Value: 4.3  Name of Provider Notified: Christy Gentles, MD

## 2020-08-10 NOTE — Final Consult Note (Addendum)
[facility] OB/GYN Attending Consult Note   Consult Date: 08/10/2020  Reason for Consult: Vaginal Bleeding Referring Physician: Shanon Brow Tat    Assessment/Plan: Vaginal Bleeding Anemia, transfuse as necessary per primary team Supratherapeutic INR, as per primary team  GYN U/S with thicken endometrium. Recommend continue with Megace 40 mg bid during hospitalization and after discharge. F/U appt with probable EMBX 08/20/2020 at 11:10 AM with Dr. Toma Deiters at [facility] .   I would like to thank Dr Tat and his team for allowing [place] to participate in the care of [name].   Please call [phone] [facility] Attending on call) for any gynecologic concerns at any time.      Arlina Robes, MD, [place] Attending [place], [place]      History of Present Illness: Alexa Castillo is an 69 y.o. [phone] female who was admitted for vaginal bleeding in setting of multiple medical problems as list in medical records. Pt reports menopause @ age 25. No vaginal bleeding until now. Bleeding started 4-5 days ago. Pt only noted when she wiped after using the restroom. Was not sure where the bleeding was coming from. She noted increased vaginal bleeding yesterday and this promoted her to come to the Er for further evaluation. Exam in ER confirmed vaginal bleeding.   TSVD x 2 H/O first trimester SAB  Not sexual active.   Last pap several years ago, normal to best of the pt's memory.    Pertinent OB/GYN History: No LMP recorded. Patient is postmenopausal.  Patient Active Problem List   Diagnosis Date Noted   Vaginal bleeding 08/10/2020   Supratherapeutic INR 08/10/2020   Chronic respiratory failure with hypoxia ([place]) 08/10/2020   Symptomatic anemia 08/10/2020   ABLA (acute blood loss anemia) 08/10/2020   Coagulopathy ([place]) 08/10/2020   Acute respiratory failure with hypoxia ([place])    COVID-19 virus  infection 08/30/2018   Diabetes mellitus type II, non insulin dependent ([place]) 08/30/2018   AKI (acute kidney injury) ([place]) 08/30/2018   Prolonged QT interval 08/30/2018   Chronic joint pain 10/16/2015   Acute pulmonary embolism ([place]) 01/15/2015   Chest pain on breathing 01/15/2015   Dyspnea on exertion 01/15/2015   Troponin level elevated 01/15/2015   Arthritis    Hypertension    Essential hypertension    Pulmonary embolism ([place])    Obstructive sleep apnea syndrome in adult 07/20/2013   Weakness of right leg 03/11/2011   Left leg weakness 03/11/2011   Osteoarthritis of hip 03/08/2011   DDD (degenerative disc disease), lumbar 03/08/2011   Right breast cancer T1bN0M0 ER Positive 2012 07/18/2010    Past Medical History:  Diagnosis Date   Arthritis    back/saw Dr. Aline Brochure   Breast cancer [facility])    Cancer [facility])    breast   Diabetes mellitus    Emphysema lung ([place])    Hypertension    Low back pain    Obesity    Pulmonary embolism [facility])    January 2017, August 2020   Sleep apnea 04/2013    Past Surgical History:  Procedure Laterality Date   BIOPSY BREAST     TUBAL LIGATION      Family History  Problem Relation Age of Onset   Liver cancer Mother    Cancer Mother    Diabetes Mother    Breast cancer Sister    Cancer Sister  Social History:  reports that she quit smoking about 25 years ago. Her smoking use included cigarettes. She has a 25.50 pack-year smoking history. She has never used smokeless tobacco. She reports that she does not drink alcohol and does not use drugs.  Allergies:  Allergies  Allergen Reactions   Lisinopril Shortness Of Breath, Swelling, Cough and Other (See Comments)   Xarelto [Rivaroxaban] Rash    Medications: I have reviewed the patient's current medications.  Review of Systems: Pertinent items are noted in HPI.  Focused Physical Examination BP 127/82   Pulse 88   Temp 97.8 F (36.6 C) (Oral)   Resp 18   SpO2 96%   Pt sitting  in chair in ER room # 18. In NAD  Lungs clear Heart RRR Abd soft + BS GU deferred  Results for orders placed or performed during the hospital encounter of 08/09/20 (from the past 72 hour(s))  Basic metabolic panel     Status: Abnormal   Collection Time: 08/10/20 12:06 AM  Result Value Ref Range   Sodium 139 135 - 145 mmol/L   Potassium 4.1 3.5 - 5.1 mmol/L   Chloride 111 98 - 111 mmol/L   CO2 22 22 - 32 mmol/L   Glucose, Bld 96 70 - 99 mg/dL    Comment: Glucose reference range applies only to samples taken after fasting for at least 8 hours.   BUN 20 8 - 23 mg/dL   Creatinine, Ser 1.06 (H) 0.44 - 1.00 mg/dL   Calcium 8.6 (L) 8.9 - 10.3 mg/dL   GFR, Estimated 57 (L) >60 mL/min    Comment: (NOTE) Calculated using the CKD-EPI Creatinine Equation (2021)    Anion gap 6 5 - 15    Comment: Performed at [facility], [address], [place], [place] [address]  CBC with Differential/Platelet     Status: Abnormal   Collection Time: 08/10/20 12:06 AM  Result Value Ref Range   WBC 7.8 4.0 - 10.5 K/uL   RBC 3.60 (L) 3.87 - 5.11 MIL/uL   Hemoglobin 10.7 (L) 12.0 - 15.0 g/dL   HCT 35.3 (L) 36.0 - 46.0 %   MCV 98.1 80.0 - 100.0 fL   MCH 29.7 26.0 - 34.0 pg   MCHC 30.3 30.0 - 36.0 g/dL   RDW 18.0 (H) 11.5 - 15.5 %   Platelets 237 150 - 400 K/uL   nRBC 0.3 (H) 0.0 - 0.2 %   Neutrophils Relative % 70 %   Neutro Abs 5.5 1.7 - 7.7 K/uL   Lymphocytes Relative 18 %   Lymphs Abs 1.4 0.7 - 4.0 K/uL   Monocytes Relative 9 %   Monocytes Absolute 0.7 0.1 - 1.0 K/uL   Eosinophils Relative 2 %   Eosinophils Absolute 0.2 0.0 - 0.5 K/uL   Basophils Relative 1 %   Basophils Absolute 0.0 0.0 - 0.1 K/uL   Immature Granulocytes 0 %   Abs Immature Granulocytes 0.03 0.00 - 0.07 K/uL    Comment: Performed at [facility], [address], [place], [place] [address]  Protime-INR     Status: Abnormal   Collection Time: 08/10/20 12:06 AM  Result Value Ref Range   Prothrombin Time 41.6 (H) 11.4 - 15.2  seconds   INR 4.3 (HH) 0.8 - 1.2    Comment: CRITICAL RESULT CALLED TO, READ BACK BY AND VERIFIED WITH: Gesell,C,'@[id number]'$  by Zigmund Daniel, b 7.29.22 RESULT REPEATED AND VERIFIED SPECIMEN CHECKED FOR CLOTS (NOTE) INR goal varies based on device and disease states.  Performed at [facility], [address] [place], [place] [address]   Type and screen     Status: None   Collection Time: 08/10/20 12:06 AM  Result Value Ref Range   ABO/RH(D) AB POS    Antibody Screen NEG    Sample Expiration      08/13/2020,2359 Performed at [facility], [address], [place], [place] [address]   Resp Panel by RT-PCR (Flu A&B, Covid) Nasopharyngeal Swab     Status: None   Collection Time: 08/10/20  2:07 AM   Specimen: Nasopharyngeal Swab; Nasopharyngeal(NP) swabs in vial transport medium  Result Value Ref Range   SARS Coronavirus 2 by RT PCR NEGATIVE NEGATIVE    Comment: (NOTE) SARS-CoV-2 target nucleic acids are NOT DETECTED.  The SARS-CoV-2 RNA is generally detectable in upper respiratory specimens during the acute phase of infection. The lowest concentration of SARS-CoV-2 viral copies this assay can detect is 138 copies/mL. A negative result does not preclude SARS-Cov-2 infection and should not be used as the sole basis for treatment or other patient management decisions. A negative result may occur with  improper specimen collection/handling, submission of specimen other than nasopharyngeal swab, presence of viral mutation(s) within the areas targeted by this assay, and inadequate number of viral copies(<138 copies/mL). A negative result must be combined with clinical observations, patient history, and epidemiological information. The expected result is Negative.  Fact Sheet for Patients:  EntrepreneurPulse.com.au  Fact Sheet for Healthcare Providers:  IncredibleEmployment.be  This test is no t yet approved or cleared by the [place] FDA and  has been  authorized for detection and/or diagnosis of SARS-CoV-2 by FDA under an Emergency Use Authorization (EUA). This EUA will remain  in effect (meaning this test can be used) for the duration of the COVID-19 declaration under Section 564(b)(1) of the Act, 21 U.S.C.section 360bbb-3(b)(1), unless the authorization is terminated  or revoked sooner.       Influenza A by PCR NEGATIVE NEGATIVE   Influenza B by PCR NEGATIVE NEGATIVE    Comment: (NOTE) The Xpert Xpress SARS-CoV-2/FLU/RSV plus assay is intended as an aid in the diagnosis of influenza from Nasopharyngeal swab specimens and should not be used as a sole basis for treatment. Nasal washings and aspirates are unacceptable for Xpert Xpress SARS-CoV-2/FLU/RSV testing.  Fact Sheet for Patients: EntrepreneurPulse.com.au  Fact Sheet for Healthcare Providers: IncredibleEmployment.be  This test is not yet approved or cleared by the [place] FDA and has been authorized for detection and/or diagnosis of SARS-CoV-2 by FDA under an Emergency Use Authorization (EUA). This EUA will remain in effect (meaning this test can be used) for the duration of the COVID-19 declaration under Section 564(b)(1) of the Act, 21 U.S.C. section 360bbb-3(b)(1), unless the authorization is terminated or revoked.  Performed at [facility], [address], [place], [place] [address]   CBG monitoring, ED     Status: Abnormal   Collection Time: 08/10/20  7:33 AM  Result Value Ref Range   Glucose-Capillary 130 (H) 70 - 99 mg/dL    Comment: Glucose reference range applies only to samples taken after fasting for at least 8 hours.   Comment 1 Notify RN   Prepare fresh frozen plasma     Status: None (Preliminary result)   Collection Time: 08/10/20  7:57 AM  Result Value Ref Range   Unit Number [id number]    Blood Component Type THW PLS APHR    Unit division B0    Status of Unit ALLOCATED  Transfusion Status OK TO  TRANSFUSE      US PELVIC COMPLETE WITH TRANSVAGINAL  Result Date: 08/10/2020 CLINICAL DATA:  70 year old postmenopausal female with vaginal bleeding for 3-4 days. EXAM: TRANSABDOMINAL AND TRANSVAGINAL ULTRASOUND OF PELVIS TECHNIQUE: Both transabdominal and transvaginal ultrasound examinations of the pelvis were performed. Transabdominal technique was performed for global imaging of the pelvis including uterus, ovaries, adnexal regions, and pelvic cul-de-sac. It was necessary to proceed with endovaginal exam following the transabdominal exam to visualize the endometrium. COMPARISON:  Lumbar MRI 06/18/2016. FINDINGS: Uterus Measurements: 9.1 x 3.5 x 4.8 cm = volume: 81 mL. No fibroids or other mass visualized. Endometrium Thickness: 7 mm, heterogeneous with areas of endometrial fluid or hypoechoic lesion (images 40, 73). Right ovary Could not be identified despite transabdominal or transvaginal imaging. Left ovary Could not be identified despite transabdominal or transvaginal imaging. Other findings Moderate volume of simple appearing free fluid or ascites in the pelvis, cul-de-sac (images 38 and 78). IMPRESSION: 1. Abnormally thickened and heterogeneous endometrium, up to 7 mm. In the setting of post-menopausal bleeding, endometrial sampling is indicated to exclude carcinoma. If results are benign, sonohysterogram should be considered for focal lesion work-up. (Ref: Radiological Reasoning: Algorithmic Workup of Abnormal Vaginal Bleeding with Endovaginal Sonography and Sonohysterography. AJR [id number]). 2. Moderate volume Ascites in the pelvis, nonspecific. Follow-up CT Abdomen and Pelvis (oral and IV contrast preferred) may be valuable. 3. Neither ovary could be identified. Electronically Signed   By: Genevie Ann M.D.   On: 08/10/2020 09:48

## 2020-08-10 NOTE — ED Provider Notes (Signed)
[facility] EMERGENCY DEPARTMENT Provider Note   CSN: [id number] Arrival date & time: 08/09/20  1701     History Chief Complaint  Patient presents with   Rectal Bleeding    Alexa Castillo is a 70 y.o. female.  The history is provided by the patient.  Rectal Bleeding Quality:  Bright red Timing:  Intermittent Chronicity:  New Relieved by:  None tried Worsened by:  Wiping Associated symptoms: no abdominal pain, no fever, no hematemesis, no loss of consciousness and no vomiting   Patient with history of diabetes, hypertension, pulmonary embolism on Coumadin presents with possible rectal bleeding.  For the past 2-3 days she has noted blood when she wipes. She suspects there is rectal bleeding.  No melena.  No vomiting.  No abdominal pain.  She reports mild weakness, but no loss of conscious   Patient wears 2 L of oxygen chronically Past Medical History:  Diagnosis Date   Arthritis    back/saw Dr. Romeo Apple   Breast cancer [facility])    Cancer [facility])    breast   Diabetes mellitus    Emphysema lung (H[facility])    Hypertension    Low back pain    Obesity    Pulmonary embolism [facility])    January 2017, August 2020   Sleep apnea 04/2013    Patient Active Problem List   Diagnosis Date Noted   Acute respiratory failure with hypoxia (HCC)    COVID-19 virus infection 08/30/2018   Diabetes mellitus type II, non insulin dependent ([facility]) 08/30/2018   AKI (acute kidney injury) (H[facility]) 08/30/2018   Prolonged QT interval 08/30/2018   Chronic joint pain 10/16/2015   Acute pulmonary embolism (HCC) 01/15/2015   Chest pain on breathing 01/15/2015   Dyspnea on exertion 01/15/2015   Troponin level elevated 01/15/2015   Arthritis    Hypertension    Essential hypertension    Pulmonary embolism (HCC)    Obstructive sleep apnea syndrome in adult 07/20/2013   Weakness of right leg 03/11/2011   Left leg weakness 03/11/2011   Osteoarthritis of hip 03/08/2011   DDD (degenerative disc disease), lumbar  03/08/2011   Right breast cancer T1bN0M0 ER Positive 2012 07/18/2010    Past Surgical History:  Procedure Laterality Date   BIOPSY BREAST     TUBAL LIGATION       OB History     Gravida  3   Para  2   Term  2   Preterm      AB  1   Living  2      SAB  1   IAB      Ectopic      Multiple      Live Births              Family History  Problem Relation Age of Onset   Liver cancer Mother    Cancer Mother    Diabetes Mother    Breast cancer Sister    Cancer Sister     Social History   Tobacco Use   Smoking status: Former    Packs/day: 1.50    Years: 17.00    Pack years: 25.50    Types: Cigarettes    Quit date: 12/18/1994    Years since quitting: 25.6   Smokeless tobacco: Never  Vaping Use   Vaping Use: Never used  Substance Use Topics   Alcohol use: No   Drug use: No    Home Medications Prior to Admission medications  Medication Sig Start Date End Date Taking? Authorizing Provider  Accu-Chek FastClix Lancets MISC  09/07/18   [provider]  ACCU-CHEK GUIDE test strip  12/07/19   [provider]  acetaminophen (TYLENOL) 650 MG CR tablet Take 1,300 mg by mouth 2 (two) times daily.    [provider]  Alcohol Swabs (B-D SINGLE USE SWABS REGULAR) PADS  09/07/18   [provider]  atorvastatin (LIPITOR) 40 MG tablet Take 40 mg by mouth every morning.  12/05/16   [provider]  dapagliflozin propanediol (FARXIGA) 10 MG TABS tablet Take 10 mg by mouth daily.    [provider]  EPINEPHrine 0.3 mg/0.3 mL IJ SOAJ injection Inject 0.3 mg into the muscle as needed.  02/26/19   [provider]  ergocalciferol (VITAMIN D2) 1.25 MG (50000 UT) capsule Take 1 capsule (50,000 Units total) by mouth once a week. Take every other week 07/31/20   Harriett Rush, PA-C  glipiZIDE (GLUCOTROL) 10 MG tablet Take 10 mg by mouth 2 (two) times daily before a meal.  06/18/10   [provider]   Lancets Misc. (ACCU-CHEK FASTCLIX LANCET) KIT  09/07/18   [provider]  letrozole (FEMARA) 2.5 MG tablet Take 1 tablet (2.5 mg total) by mouth daily. 07/31/20   Harriett Rush, PA-C  metFORMIN (GLUCOPHAGE) 1000 MG tablet Take 1,000 mg by mouth 2 (two) times daily. 06/20/14   [provider]  metoprolol succinate (TOPROL-XL) 25 MG 24 hr tablet Take 25 mg by mouth every morning.  06/20/14   [provider]  warfarin (COUMADIN) 1 MG tablet Take 1 mg by mouth daily.  05/06/19   [provider]  warfarin (COUMADIN) 5 MG tablet Take 5 mg by mouth daily.  03/30/19   [provider]    Allergies    Lisinopril and Xarelto [rivaroxaban]  Review of Systems   Review of Systems  Constitutional:  Positive for fatigue. Negative for fever.  Gastrointestinal:  Positive for hematochezia. Negative for abdominal pain, hematemesis and vomiting.  Genitourinary:  Negative for hematuria.  Neurological:  Negative for loss of consciousness.  All other systems reviewed and are negative.  Physical Exam Updated Vital Signs BP 132/85   Pulse 80   Temp 98.9 F (37.2 C) (Oral)   Resp 17   SpO2 100%   Physical Exam CONSTITUTIONAL: Elderly, no acute distress HEAD: Normocephalic/atraumatic EYES: EOMI/PERRL conjunctiva pink ENMT: Mucous membranes moist NECK: supple no meningeal signs SPINE/BACK:entire spine nontender CV: S1/S2 noted LUNGS: Lungs are clear to auscultation bilaterally, no apparent distress, patient wearing oxygen ABDOMEN: soft, nontender, no rebound or guarding, bowel sounds noted throughout abdomen, obese GU:no cva tenderness Rectal exam-external hemorrhoids noted.  Stool is brown without blood or melena Patient has blood in the vaginal vault.  Female chaperone present for exam NEURO: Pt is awake/alert/appropriate, moves all extremitiesx4.  No facial droop.   EXTREMITIES: pulses normal/equal, full ROM SKIN: warm, color normal PSYCH: no  abnormalities of mood noted, alert and oriented to situation  ED Results / Procedures / Treatments   Labs (all labs ordered are listed, but only abnormal results are displayed) Labs Reviewed  BASIC METABOLIC PANEL - Abnormal; Notable for the following components:      Result Value   Creatinine, Ser 1.06 (*)    Calcium 8.6 (*)    GFR, Estimated 57 (*)    All other components within normal limits  CBC WITH DIFFERENTIAL/PLATELET - Abnormal; Notable for the following components:  RBC 3.60 (*)    Hemoglobin 10.7 (*)    HCT 35.3 (*)    RDW 18.0 (*)    nRBC 0.3 (*)    All other components within normal limits  PROTIME-INR - Abnormal; Notable for the following components:   Prothrombin Time 41.6 (*)    INR 4.3 (*)    All other components within normal limits  RESP PANEL BY RT-PCR (FLU A&B, COVID) ARPGX2  POC OCCULT BLOOD, ED  TYPE AND SCREEN    EKG None  Radiology No results found.  Procedures Procedures   Medications Ordered in ED Medications  megestrol (MEGACE) tablet 40 mg (has no administration in time range)    ED Course  I have reviewed the triage vital signs and the nursing notes.  Pertinent labs & imaging results that were available during my care of the patient were reviewed by me and considered in my medical decision making (see chart for details).    MDM Rules/Calculators/A&P                            On physical exam, patient has obvious vaginal bleeding, stool color is brown. Labs are pending at this time 1:52 AM Labs revealed mild drop in hemoglobin.  Patient also has supratherapeutic INR. Discussed the case with on-call gynecology Dr. Kennon Rounds She recommends 40 mg Megace twice daily (should be safe since she is anticoagulated).  Will admit for monitoring.  Pelvic ultrasound in the morning, then the local gynecologist can see the patient (Dr Rip Harbour) She will ensure that local gynecologist is aware of patient 2:35 AM D/w Dr Josephine Cables for  admission  Final Clinical Impression(s) / ED Diagnoses Final diagnoses:  Vaginal bleeding  Coagulopathy [facility])    Rx / DC Orders ED Discharge Orders     None        Ripley Fraise, MD 08/10/20 0236

## 2020-08-11 DIAGNOSIS — D649 Anemia, unspecified: Secondary | ICD-10-CM

## 2020-08-11 DIAGNOSIS — R791 Abnormal coagulation profile: Secondary | ICD-10-CM

## 2020-08-11 LAB — APTT: aPTT: 43 seconds — ABNORMAL HIGH (ref 24–36)

## 2020-08-11 LAB — CBC
HCT: 34.7 % — ABNORMAL LOW (ref 36.0–46.0)
Hemoglobin: 10.5 g/dL — ABNORMAL LOW (ref 12.0–15.0)
MCH: 29.2 pg (ref 26.0–34.0)
MCHC: 30.3 g/dL (ref 30.0–36.0)
MCV: 96.7 fL (ref 80.0–100.0)
Platelets: 229 10*3/uL (ref 150–400)
RBC: 3.59 MIL/uL — ABNORMAL LOW (ref 3.87–5.11)
RDW: 17.7 % — ABNORMAL HIGH (ref 11.5–15.5)
WBC: 8.1 10*3/uL (ref 4.0–10.5)
nRBC: 0.4 % — ABNORMAL HIGH (ref 0.0–0.2)

## 2020-08-11 LAB — BPAM FFP
Blood Product Expiration Date: 202207292359
ISSUE DATE / TIME: 202207291250
Unit Type and Rh: 8400

## 2020-08-11 LAB — PREPARE FRESH FROZEN PLASMA

## 2020-08-11 LAB — PROTIME-INR
INR: 3.6 — ABNORMAL HIGH (ref 0.8–1.2)
Prothrombin Time: 36 seconds — ABNORMAL HIGH (ref 11.4–15.2)

## 2020-08-11 LAB — COMPREHENSIVE METABOLIC PANEL
ALT: 22 U/L (ref 0–44)
AST: 33 U/L (ref 15–41)
Albumin: 3 g/dL — ABNORMAL LOW (ref 3.5–5.0)
Alkaline Phosphatase: 126 U/L (ref 38–126)
Anion gap: 8 (ref 5–15)
BUN: 18 mg/dL (ref 8–23)
CO2: 22 mmol/L (ref 22–32)
Calcium: 8.6 mg/dL — ABNORMAL LOW (ref 8.9–10.3)
Chloride: 108 mmol/L (ref 98–111)
Creatinine, Ser: 0.76 mg/dL (ref 0.44–1.00)
GFR, Estimated: >60 mL/min (ref >60)
Glucose, Bld: 124 mg/dL — ABNORMAL HIGH (ref 70–99)
Potassium: 3.6 mmol/L (ref 3.5–5.1)
Sodium: 138 mmol/L (ref 135–145)
Total Bilirubin: 1 mg/dL (ref 0.3–1.2)
Total Protein: 6.8 g/dL (ref 6.5–8.1)

## 2020-08-11 LAB — GLUCOSE, CAPILLARY
Glucose-Capillary: 115 mg/dL — ABNORMAL HIGH (ref 70–99)
Glucose-Capillary: 135 mg/dL — ABNORMAL HIGH (ref 70–99)
Glucose-Capillary: 123 mg/dL — ABNORMAL HIGH (ref 70–99)
Glucose-Capillary: 182 mg/dL — ABNORMAL HIGH (ref 70–99)

## 2020-08-11 MED ORDER — FUROSEMIDE 10 MG/ML IJ SOLN
40.0000 mg | Freq: Once | INTRAMUSCULAR | Status: AC
  Administered 2020-08-11: 40 mg via INTRAVENOUS
  Filled 2020-08-11: qty 4

## 2020-08-11 MED ORDER — SODIUM CHLORIDE 0.9% IV SOLUTION: Freq: Once | INTRAVENOUS | Status: AC

## 2020-08-11 NOTE — Progress Notes (Signed)
ANTICOAGULATION CONSULT NOTE -   Pharmacy Consult for warfarin Indication: hx of PE  Allergies  Allergen Reactions   Lisinopril Shortness Of Breath, Swelling, Cough and Other (See Comments)   Xarelto [Rivaroxaban] Rash    Patient Measurements:   Heparin Dosing Weight:   Vital Signs: Temp: 97.6 F (36.4 C) (07/30 0516) Temp Source: Oral (07/30 0516) BP: 109/75 (07/30 0516) Pulse Rate: 93 (07/30 0516)  Labs: Recent Labs    08/10/20 0006 08/11/20 0404  HGB 10.7* 10.5*  HCT 35.3* 34.7*  PLT 237 229  APTT  --  43*  LABPROT 41.6* 36.0*  INR 4.3* 3.6*  CREATININE 1.06* 0.76     Estimated Creatinine Clearance: 72.4 mL/min (by C-G formula based on SCr of 0.76 mg/dL).   Medical History: Past Medical History:  Diagnosis Date   Arthritis    back/saw Dr. Aline Brochure   Breast cancer [facility])    Cancer [facility])    breast   Diabetes mellitus    Emphysema lung ([place])    Hypertension    Low back pain    Obesity    Pulmonary embolism [facility])    January 2017, August 2020   Sleep apnea 04/2013    Medications:  Medications Prior to Admission  Medication Sig Dispense Refill Last Dose   acetaminophen (TYLENOL) 650 MG CR tablet Take 1,300 mg by mouth 2 (two) times daily.   08/09/2020   dapagliflozin propanediol (FARXIGA) 10 MG TABS tablet Take 10 mg by mouth daily.   08/09/2020   ergocalciferol (VITAMIN D2) 1.25 MG (50000 UT) capsule Take 1 capsule (50,000 Units total) by mouth once a week. Take every other week 16 capsule 3 08/09/2020   furosemide (LASIX) 20 MG tablet Take 10 mg by mouth daily.   08/09/2020   glipiZIDE (GLUCOTROL) 10 MG tablet Take 10 mg by mouth 2 (two) times daily before a meal.    08/09/2020   latanoprost (XALATAN) 0.005 % ophthalmic solution Place 1 drop into both eyes at bedtime.   08/08/2020   letrozole (FEMARA) 2.5 MG tablet Take 1 tablet (2.5 mg total) by mouth daily. 90 tablet 3 08/09/2020   losartan (COZAAR) 25 MG tablet Take 25 mg by mouth daily.   08/09/2020    metFORMIN (GLUCOPHAGE) 1000 MG tablet Take 1,000 mg by mouth 2 (two) times daily.  1 08/09/2020   metoprolol succinate (TOPROL-XL) 25 MG 24 hr tablet Take 100 mg by mouth every morning.  1 08/09/2020   warfarin (COUMADIN) 1 MG tablet Take 1 mg by mouth daily. Coumadin is being adjusted now due to elevated INR.      warfarin (COUMADIN) 5 MG tablet Take 5 mg by mouth daily.    08/09/2020   Accu-Chek FastClix Lancets MISC       ACCU-CHEK GUIDE test strip       Alcohol Swabs (B-D SINGLE USE SWABS REGULAR) PADS       EPINEPHrine 0.3 mg/0.3 mL IJ SOAJ injection Inject 0.3 mg into the muscle as needed.    PRN   Lancets Misc. (ACCU-CHEK FASTCLIX LANCET) KIT        Assessment: Pharmacy consulted to dose warfarin in patient with history of PE from January 2017 and August 2020.  Patient with supratherapeutic INR of 4.3 on admission.  Hgb 10.5 INR 4.3 > 3.6  Goal of Therapy:  INR 2-3 Monitor platelets by anticoagulation protocol: Yes   Plan:  Hold warfarin x 1 dose. Monitor daily INR and s/s of bleeding  Revonda Standard  Colin Ellers 08/11/2020,9:02 AM

## 2020-08-11 NOTE — Progress Notes (Signed)
PROGRESS NOTE  Alexa Castillo [id number] DOB: 08-Feb-1950 DOA: 08/09/2020 PCP: Abran Richard, MD   Brief History:  70 year old female with a history of right-sided breast cancer, diabetes mellitus type 2, COPD, pulmonary emboli x2, OSA, hypertension presenting with what she felt to be rectal bleeding for 3 to 4 days.  She noted blood when she wiped.  However, upon evaluation in the emergency department it was noted that she did not have any rectal bleeding and there was blood in the patient's vaginal vault.  The patient denies any fevers, chills, chest pain, dizziness.  She has had some chronic shortness of breath which she states has been about the same as usual.  She denies any nausea, vomiting diarrhea, abdominal pain.  She has never had any vaginal bleeding in the past.  She has not seen her gynecologist for over 8 years.  She denies any coughing, hemoptysis, hematemesis, hematuria. In the emergency department, the patient was afebrile hemodynamically stable with oxygen saturation 100% on 2 L.  BMP was unremarkable with sodium 139, potassium 4.1, serum creatinine 1.06.  WBC 7.8, hemoglobin 10.7, platelets 237,000.  INR was 4.3.  GYN, Dr. Kennon Rounds was contacted, and recommended starting the patient on Megace.  Dr. Kennon Rounds will contact Dr. Rip Harbour to follow-up with the patient.   Assessment/Plan: Symptomatic anemia/acute blood loss anemia -Secondary to vaginal bleeding -Gynecology consult appreciated -Continue Megace 40 mg bid -pelvic ultrasound--abnormal thickened heterogenous endometrium -vaginal bleeding is decreasing   Coagulopathy -Supratherapeutic INR>>remains elevated after one unit FFP -partly due to recent cipro course -Give second unit FFP given the patient's active bleeding and drop in hemoglobin -Follow INR serially -The patient has been struggling with supratherapeutic INRs for the last several weeks   Chronic diastolic CHF -Continue furosemide -redose lasix 40 mg  IV x 1 -Restart metoprolol succinate   Diabetes mellitus type 2, uncontrolled with hyperglycemia -08/10/20 hemoglobin A1c--7.9 -NovoLog sliding scale -Holding glipizide and metformin -Holding Farxiga temporarily   Chronic respiratory failure with hypoxia -She is chronically on 2 L nasal cannula -Stable presently   Essential hypertension -Continue metoprolol succinate   History of pulmonary embolus -She has had PEs documented January 2017 and August 2020 with right heart strain -Continue warfarin   History of Breast Cancer -continue Letrozole   Hyperlipidemia -continue statin       Status is: Inpatient   Remains inpatient appropriate because:IV treatments appropriate due to intensity of illness or inability to take PO   Dispo: The patient is from: Home              Anticipated d/c is to: Home              Patient currently is not medically stable to d/c.              Difficult to place patient No               Family Communication:  no Family at bedside   Consultants:  GYN   Code Status:  FULL   DVT Prophylaxis:  warfarin     Procedures: As Listed in Progress Note Above   Antibiotics: None  Subjective: Patient denies fevers, chills, headache, chest pain, dyspnea, nausea, vomiting, diarrhea, abdominal pain, dysuria, hematuria, hematochezia, and melena.   Objective: Vitals:   08/10/20 1600 08/10/20 1741 08/10/20 2141 08/11/20 0516  BP: 114/66 121/80 114/76 109/75  Pulse: 90  87 93  Resp: (!) 26 20 20  19  Temp:  98.1 F (36.7 C) 98.3 F (36.8 C) 97.6 F (36.4 C)  TempSrc:  Oral Oral Oral  SpO2: 97% 97% 95% 95%    Intake/Output Summary (Last 24 hours) at 08/11/2020 1327 Last data filed at 08/11/2020 0900 Gross per 24 hour  Intake 912 ml  Output --  Net 912 ml   Weight change:  Exam:  General:  Pt is alert, follows commands appropriately, not in acute distress HEENT: No icterus, No thrush, No neck mass, [place]/AT Cardiovascular: RRR, S1/S2,  no rubs, no gallops Respiratory: fine bibasilar crackles. No wheeze Abdomen: Soft/+BS, non tender, non distended, no guarding Extremities: 2+ LE edema, No lymphangitis, No petechiae, No rashes, no synovitis   Data Reviewed: I have personally reviewed following labs and imaging studies Basic Metabolic Panel: Recent Labs  Lab 08/10/20 0006 08/11/20 0404  NA 139 138  K 4.1 3.6  CL 111 108  CO2 22 22  GLUCOSE 96 124*  BUN 20 18  CREATININE 1.06* 0.76  CALCIUM 8.6* 8.6*   Liver Function Tests: Recent Labs  Lab 08/11/20 0404  AST 33  ALT 22  ALKPHOS 126  BILITOT 1.0  PROT 6.8  ALBUMIN 3.0*   No results for input(s): LIPASE, AMYLASE in the last 168 hours. No results for input(s): AMMONIA in the last 168 hours. Coagulation Profile: Recent Labs  Lab 08/10/20 0006 08/11/20 0404  INR 4.3* 3.6*   CBC: Recent Labs  Lab 08/10/20 0006 08/11/20 0404  WBC 7.8 8.1  NEUTROABS 5.5  --   HGB 10.7* 10.5*  HCT 35.3* 34.7*  MCV 98.1 96.7  PLT 237 229   Cardiac Enzymes: No results for input(s): CKTOTAL, CKMB, CKMBINDEX, TROPONINI in the last 168 hours. BNP: Invalid input(s): POCBNP CBG: Recent Labs  Lab 08/10/20 0733 08/10/20 1206 08/10/20 2217 08/11/20 0728 08/11/20 1113  GLUCAP 130* 139* 139* 115* 135*   HbA1C: Recent Labs    08/10/20 0716  HGBA1C 7.9*   Urine analysis:    Component Value Date/Time   COLORURINE YELLOW 03/03/2015 0010   APPEARANCEUR CLEAR 03/03/2015 0010   LABSPEC 1.010 03/03/2015 0010   PHURINE 5.5 03/03/2015 0010   GLUCOSEU >1000 (A) 03/03/2015 0010   HGBUR SMALL (A) 03/03/2015 0010   BILIRUBINUR NEGATIVE 03/03/2015 0010   KETONESUR 15 (A) 03/03/2015 0010   PROTEINUR NEGATIVE 03/03/2015 0010   NITRITE POSITIVE (A) 03/03/2015 0010   LEUKOCYTESUR NEGATIVE 03/03/2015 0010   Sepsis Labs: '@LABRCNTIP'$ (procalcitonin:4,lacticidven:4) ) Recent Results (from the past 240 hour(s))  Resp Panel by RT-PCR (Flu A&B, Covid) Nasopharyngeal Swab      Status: None   Collection Time: 08/10/20  2:07 AM   Specimen: Nasopharyngeal Swab; Nasopharyngeal(NP) swabs in vial transport medium  Result Value Ref Range Status   SARS Coronavirus 2 by RT PCR NEGATIVE NEGATIVE Final    Comment: (NOTE) SARS-CoV-2 target nucleic acids are NOT DETECTED.  The SARS-CoV-2 RNA is generally detectable in upper respiratory specimens during the acute phase of infection. The lowest concentration of SARS-CoV-2 viral copies this assay can detect is 138 copies/mL. A negative result does not preclude SARS-Cov-2 infection and should not be used as the sole basis for treatment or other patient management decisions. A negative result may occur with  improper specimen collection/handling, submission of specimen other than nasopharyngeal swab, presence of viral mutation(s) within the areas targeted by this assay, and inadequate number of viral copies(<138 copies/mL). A negative result must be combined with clinical observations, patient history, and epidemiological information. The  expected result is Negative.  Fact Sheet for Patients:  EntrepreneurPulse.com.au  Fact Sheet for Healthcare Providers:  IncredibleEmployment.be  This test is no t yet approved or cleared by the [place] FDA and  has been authorized for detection and/or diagnosis of SARS-CoV-2 by FDA under an Emergency Use Authorization (EUA). This EUA will remain  in effect (meaning this test can be used) for the duration of the COVID-19 declaration under Section 564(b)(1) of the Act, 21 U.S.C.section 360bbb-3(b)(1), unless the authorization is terminated  or revoked sooner.       Influenza A by PCR NEGATIVE NEGATIVE Final   Influenza B by PCR NEGATIVE NEGATIVE Final    Comment: (NOTE) The Xpert Xpress SARS-CoV-2/FLU/RSV plus assay is intended as an aid in the diagnosis of influenza from Nasopharyngeal swab specimens and should not be used as a sole basis  for treatment. Nasal washings and aspirates are unacceptable for Xpert Xpress SARS-CoV-2/FLU/RSV testing.  Fact Sheet for Patients: EntrepreneurPulse.com.au  Fact Sheet for Healthcare Providers: IncredibleEmployment.be  This test is not yet approved or cleared by the [place] FDA and has been authorized for detection and/or diagnosis of SARS-CoV-2 by FDA under an Emergency Use Authorization (EUA). This EUA will remain in effect (meaning this test can be used) for the duration of the COVID-19 declaration under Section 564(b)(1) of the Act, 21 U.S.C. section 360bbb-3(b)(1), unless the authorization is terminated or revoked.  Performed at [facility], [address] [place], [place] [address]      Scheduled Meds:  sodium chloride   Intravenous Once   atorvastatin  40 mg Oral q morning   furosemide  40 mg Intravenous Once   insulin aspart  0-5 Units Subcutaneous QHS   insulin aspart  0-9 Units Subcutaneous TID WC   letrozole  2.5 mg Oral Daily   megestrol  40 mg Oral BID   metoprolol succinate  25 mg Oral q morning   nystatin   Topical BID   Continuous Infusions:  Procedures/Studies: MM DIAG BREAST TOMO UNI LEFT  Result Date: 07/17/2020 CLINICAL DATA:  Possible asymmetry in the posterior upper left breast in the oblique projection of a recent screening mammogram. EXAM: DIGITAL DIAGNOSTIC UNILATERAL LEFT MAMMOGRAM WITH TOMOSYNTHESIS AND CAD TECHNIQUE: Left digital diagnostic mammography and breast tomosynthesis was performed. The images were evaluated with computer-aided detection. COMPARISON:  Previous exam(s). ACR Breast Density Category b: There are scattered areas of fibroglandular density. FINDINGS: 3D tomographic and 2D generated true lateral and spot compression oblique images of the left breast demonstrate normal fibroglandular tissue and supporting ligaments in the posterior upper left breast at the location of the recently suspected  asymmetry. IMPRESSION: No evidence of malignancy. The recently suspected left breast asymmetry was close apposition of normal breast tissue. RECOMMENDATION: Bilateral screening mammogram in 1 year. I have discussed the findings and recommendations with the patient. If applicable, a reminder letter will be sent to the patient regarding the next appointment. BI-RADS CATEGORY  1: Negative. Electronically Signed   By: Claudie Revering M.D.   On: 07/17/2020 14:40  US PELVIC COMPLETE WITH TRANSVAGINAL  Result Date: 08/10/2020 CLINICAL DATA:  70 year old postmenopausal female with vaginal bleeding for 3-4 days. EXAM: TRANSABDOMINAL AND TRANSVAGINAL ULTRASOUND OF PELVIS TECHNIQUE: Both transabdominal and transvaginal ultrasound examinations of the pelvis were performed. Transabdominal technique was performed for global imaging of the pelvis including uterus, ovaries, adnexal regions, and pelvic cul-de-sac. It was necessary to proceed with endovaginal exam following the transabdominal exam to visualize the endometrium. COMPARISON:  Lumbar MRI 06/18/2016. FINDINGS: Uterus Measurements: 9.1 x 3.5 x 4.8 cm = volume: 81 mL. No fibroids or other mass visualized. Endometrium Thickness: 7 mm, heterogeneous with areas of endometrial fluid or hypoechoic lesion (images 40, 73). Right ovary Could not be identified despite transabdominal or transvaginal imaging. Left ovary Could not be identified despite transabdominal or transvaginal imaging. Other findings Moderate volume of simple appearing free fluid or ascites in the pelvis, cul-de-sac (images 38 and 78). IMPRESSION: 1. Abnormally thickened and heterogeneous endometrium, up to 7 mm. In the setting of post-menopausal bleeding, endometrial sampling is indicated to exclude carcinoma. If results are benign, sonohysterogram should be considered for focal lesion work-up. (Ref: Radiological Reasoning: Algorithmic Workup of Abnormal Vaginal Bleeding with Endovaginal Sonography and  Sonohysterography. AJR [id number]). 2. Moderate volume Ascites in the pelvis, nonspecific. Follow-up CT Abdomen and Pelvis (oral and IV contrast preferred) may be valuable. 3. Neither ovary could be identified. Electronically Signed   By: Genevie Ann M.D.   On: 08/10/2020 09:48    Orson Eva, DO  [facility]  If 7PM-7AM, please contact night-coverage [email or web address] Password [email or web address] 08/11/2020, 1:27 PM   LOS: 1 day

## 2020-08-11 NOTE — Progress Notes (Signed)
Received plasma today with no adverse reaction.  Has been ambulating to bathroom and vaginal bleeding is very scant on peripad, would only need one pad in 24 hours, if that.  Legs are weeping copious amount of sanguinous fluid , with dressings needing to be changed twice today and each leg saturating 4 abds and 4 kerlex rolls ,apiece, in a 12 hour period.

## 2020-08-12 LAB — BASIC METABOLIC PANEL
Anion gap: 9 (ref 5–15)
BUN: 19 mg/dL (ref 8–23)
CO2: 24 mmol/L (ref 22–32)
Calcium: 8.8 mg/dL — ABNORMAL LOW (ref 8.9–10.3)
Chloride: 108 mmol/L (ref 98–111)
Creatinine, Ser: 0.91 mg/dL (ref 0.44–1.00)
GFR, Estimated: >60 mL/min (ref >60)
Glucose, Bld: 143 mg/dL — ABNORMAL HIGH (ref 70–99)
Potassium: 3.5 mmol/L (ref 3.5–5.1)
Sodium: 141 mmol/L (ref 135–145)

## 2020-08-12 LAB — GLUCOSE, CAPILLARY
Glucose-Capillary: 124 mg/dL — ABNORMAL HIGH (ref 70–99)
Glucose-Capillary: 157 mg/dL — ABNORMAL HIGH (ref 70–99)

## 2020-08-12 LAB — BPAM FFP
Blood Product Expiration Date: 202208042359
ISSUE DATE / TIME: 202207301527
Unit Type and Rh: 8400

## 2020-08-12 LAB — CBC
HCT: 33.8 % — ABNORMAL LOW (ref 36.0–46.0)
Hemoglobin: 10.4 g/dL — ABNORMAL LOW (ref 12.0–15.0)
MCH: 29.5 pg (ref 26.0–34.0)
MCHC: 30.8 g/dL (ref 30.0–36.0)
MCV: 96 fL (ref 80.0–100.0)
Platelets: 243 10*3/uL (ref 150–400)
RBC: 3.52 MIL/uL — ABNORMAL LOW (ref 3.87–5.11)
RDW: 17.8 % — ABNORMAL HIGH (ref 11.5–15.5)
WBC: 7.7 10*3/uL (ref 4.0–10.5)
nRBC: 0.4 % — ABNORMAL HIGH (ref 0.0–0.2)

## 2020-08-12 LAB — PREPARE FRESH FROZEN PLASMA

## 2020-08-12 LAB — PROTIME-INR
INR: 2.8 — ABNORMAL HIGH (ref 0.8–1.2)
Prothrombin Time: 29.3 seconds — ABNORMAL HIGH (ref 11.4–15.2)

## 2020-08-12 MED ORDER — WARFARIN SODIUM 2 MG PO TABS
2.0000 mg | ORAL_TABLET | Freq: Once | ORAL | Status: AC
  Administered 2020-08-12: 2 mg via ORAL
  Filled 2020-08-12: qty 1

## 2020-08-12 MED ORDER — WARFARIN - PHARMACIST DOSING INPATIENT: Freq: Every day | Status: DC

## 2020-08-12 MED ORDER — MEGESTROL ACETATE 40 MG PO TABS: 40.0000 mg | ORAL_TABLET | Freq: Two times a day (BID) | ORAL | 0 refills | Status: AC

## 2020-08-12 MED ORDER — FUROSEMIDE 10 MG/ML IJ SOLN
20.0000 mg | Freq: Once | INTRAMUSCULAR | Status: AC
  Administered 2020-08-12: 20 mg via INTRAVENOUS
  Filled 2020-08-12: qty 2

## 2020-08-12 MED ORDER — WARFARIN SODIUM 1 MG PO TABS: 2.0000 mg | ORAL_TABLET | Freq: Every day | ORAL | Status: AC

## 2020-08-12 NOTE — Progress Notes (Signed)
Discharge teaching complete. Meds, diet, activity, follow up appointments reviewed and all questions answered. Copy of instructions given to patient and prescription sent to pharmacy. The patient's son is coming to pick her up. He does not have an oxygen tank for her for the ride home. She insists that she will be ok without it and does not want me to contact case manager for a tank to be arranged to be delivered to the hospital prior to discharge.

## 2020-08-12 NOTE — Progress Notes (Signed)
Patient discharged home via wheelchair with granddaughter.

## 2020-08-12 NOTE — Discharge Summary (Signed)
Physician Discharge Summary  Alexa Castillo EEF:[id number] DOB: Sep 20, 1950 DOA: 08/09/2020  PCP: Abran Richard, MD  Admit date: 08/09/2020 Discharge date: 08/12/2020  Admitted From: Home Disposition:  Home   Recommendations for Outpatient Follow-up:  Follow up with PCP in 1-2 weeks Please obtain BMP/CBC in one week 3.  Please check INR on 8/1 or 8/2 and adjust coumadin dose for INR targe 2-3   Equipment/Devices: 2L Discharge Condition: Stable CODE STATUS: FULL Diet recommendation: Heart Healthy / Carb Modified   Brief/Interim Summary: 70 year old female with a history of right-sided breast cancer, diabetes mellitus type 2, COPD, pulmonary emboli x2, OSA, hypertension presenting with what she felt to be rectal bleeding for 3 to 4 days.  She noted blood when she wiped.  However, upon evaluation in the emergency department it was noted that she did not have any rectal bleeding and there was blood in the patient's vaginal vault.  The patient denies any fevers, chills, chest pain, dizziness.  She has had some chronic shortness of breath which she states has been about the same as usual.  She denies any nausea, vomiting diarrhea, abdominal pain.  She has never had any vaginal bleeding in the past.  She has not seen her gynecologist for over 8 years.  She denies any coughing, hemoptysis, hematemesis, hematuria. In the emergency department, the patient was afebrile hemodynamically stable with oxygen saturation 100% on 2 L.  BMP was unremarkable with sodium 139, potassium 4.1, serum creatinine 1.06.  WBC 7.8, hemoglobin 10.7, platelets 237,000.  INR was 4.3.  GYN, Dr. Kennon Rounds was contacted, and recommended starting the patient on Megace.  Dr. Kennon Rounds will contact Dr. Rip Harbour to follow-up with the patient.  Discharge Diagnoses:  Symptomatic anemia/acute blood loss anemia -Secondary to vaginal bleeding -Gynecology consult appreciated -Continue Megace 40 mg bid -pelvic ultrasound--abnormal thickened  heterogenous endometrium -vaginal bleeding is decreasing>>scant at time of d/c -Hgb remains stable--10.4 on day of d/c   Coagulopathy -Supratherapeutic INR>>remains elevated after one unit FFP -partly due to recent cipro course -Given 2 units FFP given the patient's active bleeding and drop in hemoglobin -Follow INR serially--2.8 on day of dc -The patient has been struggling with supratherapeutic INRs for the last several weeks -discussed with pharmacy--d/c home with coumadin 2 mg daily--get INR check on 8/1 or 8/2   Chronic diastolic CHF -Continue furosemide home dose after d/ -dosed IV lasix $Remove'40mg'cRRmRFt$  x 1 and 20 mg x 1 during hospitalization -Restart metoprolol succinate   Diabetes mellitus type 2, uncontrolled with hyperglycemia -08/10/20 hemoglobin A1c--7.9 -NovoLog sliding scale -Holding glipizide and metformin -Holding Farxiga temporarily   Chronic respiratory failure with hypoxia -She is chronically on 2 L nasal cannula -Stable presently   Essential hypertension -Continue metoprolol succinate   History of pulmonary embolus -She has had PEs documented January 2017 and August 2020 with right heart strain -Continue warfarin   History of Breast Cancer -continue Letrozole   Hyperlipidemia -continue statin  Discharge Instructions   Allergies as of 08/12/2020       Reactions   Lisinopril Shortness Of Breath, Swelling, Cough, Other (See Comments)   Xarelto [rivaroxaban] Rash        Medication List     TAKE these medications    Accu-Chek FastClix Lancet Kit   Accu-Chek FastClix Lancets Misc   Accu-Chek Guide test strip Generic drug: glucose blood   acetaminophen 650 MG CR tablet Commonly known as: TYLENOL Take 1,300 mg by mouth 2 (two) times daily.   B-D SINGLE USE  SWABS REGULAR Pads   dapagliflozin propanediol 10 MG Tabs tablet Commonly known as: FARXIGA Take 10 mg by mouth daily.   EPINEPHrine 0.3 mg/0.3 mL Soaj injection Commonly known as:  EPI-PEN Inject 0.3 mg into the muscle as needed.   ergocalciferol 1.25 MG (50000 UT) capsule Commonly known as: VITAMIN D2 Take 1 capsule (50,000 Units total) by mouth once a week. Take every other week   furosemide 20 MG tablet Commonly known as: LASIX Take 10 mg by mouth daily.   glipiZIDE 10 MG tablet Commonly known as: GLUCOTROL Take 10 mg by mouth 2 (two) times daily before a meal.   latanoprost 0.005 % ophthalmic solution Commonly known as: XALATAN Place 1 drop into both eyes at bedtime.   letrozole 2.5 MG tablet Commonly known as: FEMARA Take 1 tablet (2.5 mg total) by mouth daily.   losartan 25 MG tablet Commonly known as: COZAAR Take 25 mg by mouth daily.   megestrol 40 MG tablet Commonly known as: MEGACE Take 1 tablet (40 mg total) by mouth 2 (two) times daily.   metFORMIN 1000 MG tablet Commonly known as: GLUCOPHAGE Take 1,000 mg by mouth 2 (two) times daily.   metoprolol succinate 25 MG 24 hr tablet Commonly known as: TOPROL-XL Take 100 mg by mouth every morning.   warfarin 1 MG tablet Commonly known as: COUMADIN Take 2 tablets (2 mg total) by mouth daily. Coumadin is being adjusted now due to elevated INR. What changed:  how much to take Another medication with the same name was removed. Continue taking this medication, and follow the directions you see here.        Allergies  Allergen Reactions   Lisinopril Shortness Of Breath, Swelling, Cough and Other (See Comments)   Xarelto [Rivaroxaban] Rash    Consultations: GYN   Procedures/Studies: MM DIAG BREAST TOMO UNI LEFT  Result Date: 07/17/2020 CLINICAL DATA:  Possible asymmetry in the posterior upper left breast in the oblique projection of a recent screening mammogram. EXAM: DIGITAL DIAGNOSTIC UNILATERAL LEFT MAMMOGRAM WITH TOMOSYNTHESIS AND CAD TECHNIQUE: Left digital diagnostic mammography and breast tomosynthesis was performed. The images were evaluated with computer-aided detection.  COMPARISON:  Previous exam(s). ACR Breast Density Category b: There are scattered areas of fibroglandular density. FINDINGS: 3D tomographic and 2D generated true lateral and spot compression oblique images of the left breast demonstrate normal fibroglandular tissue and supporting ligaments in the posterior upper left breast at the location of the recently suspected asymmetry. IMPRESSION: No evidence of malignancy. The recently suspected left breast asymmetry was close apposition of normal breast tissue. RECOMMENDATION: Bilateral screening mammogram in 1 year. I have discussed the findings and recommendations with the patient. If applicable, a reminder letter will be sent to the patient regarding the next appointment. BI-RADS CATEGORY  1: Negative. Electronically Signed   By: Claudie Revering M.D.   On: 07/17/2020 14:40  US PELVIC COMPLETE WITH TRANSVAGINAL  Result Date: 08/10/2020 CLINICAL DATA:  70 year old postmenopausal female with vaginal bleeding for 3-4 days. EXAM: TRANSABDOMINAL AND TRANSVAGINAL ULTRASOUND OF PELVIS TECHNIQUE: Both transabdominal and transvaginal ultrasound examinations of the pelvis were performed. Transabdominal technique was performed for global imaging of the pelvis including uterus, ovaries, adnexal regions, and pelvic cul-de-sac. It was necessary to proceed with endovaginal exam following the transabdominal exam to visualize the endometrium. COMPARISON:  Lumbar MRI 06/18/2016. FINDINGS: Uterus Measurements: 9.1 x 3.5 x 4.8 cm = volume: 81 mL. No fibroids or other mass visualized. Endometrium Thickness: 7 mm, heterogeneous with areas  of endometrial fluid or hypoechoic lesion (images 40, 73). Right ovary Could not be identified despite transabdominal or transvaginal imaging. Left ovary Could not be identified despite transabdominal or transvaginal imaging. Other findings Moderate volume of simple appearing free fluid or ascites in the pelvis, cul-de-sac (images 38 and 78). IMPRESSION: 1.  Abnormally thickened and heterogeneous endometrium, up to 7 mm. In the setting of post-menopausal bleeding, endometrial sampling is indicated to exclude carcinoma. If results are benign, sonohysterogram should be considered for focal lesion work-up. (Ref: Radiological Reasoning: Algorithmic Workup of Abnormal Vaginal Bleeding with Endovaginal Sonography and Sonohysterography. AJR 2008; 670:G11-25). 2. Moderate volume Ascites in the pelvis, nonspecific. Follow-up CT Abdomen and Pelvis (oral and IV contrast preferred) may be valuable. 3. Neither ovary could be identified. Electronically Signed   By: Odessa Fleming M.D.   On: 08/10/2020 09:48        Discharge Exam: Vitals:   08/11/20 2042 08/12/20 0517  BP: 124/69 106/65  Pulse: (!) 102 91  Resp: 20 16  Temp: 98.9 F (37.2 C) 97.8 F (36.6 C)  SpO2: 92% 97%   Vitals:   08/11/20 1750 08/11/20 2010 08/11/20 2042 08/12/20 0517  BP: 120/79  124/69 106/65  Pulse: 92  (!) 102 91  Resp: 18  20 16   Temp: 98.1 F (36.7 C)  98.9 F (37.2 C) 97.8 F (36.6 C)  TempSrc:   Oral Oral  SpO2: 100% 97% 92% 97%    General: Pt is alert, awake, not in acute distress Cardiovascular: RRR, S1/S2 +, no rubs, no gallops Respiratory: bibasilar crackles. No wheeze Abdominal: Soft, NT, ND, bowel sounds + Extremities: 2 + LE edema, no cyanosis   The results of significant diagnostics from this hospitalization (including imaging, microbiology, ancillary and laboratory) are listed below for reference.    Significant Diagnostic Studies: MM DIAG BREAST TOMO UNI LEFT  Result Date: 07/17/2020 CLINICAL DATA:  Possible asymmetry in the posterior upper left breast in the oblique projection of a recent screening mammogram. EXAM: DIGITAL DIAGNOSTIC UNILATERAL LEFT MAMMOGRAM WITH TOMOSYNTHESIS AND CAD TECHNIQUE: Left digital diagnostic mammography and breast tomosynthesis was performed. The images were evaluated with computer-aided detection. COMPARISON:  Previous exam(s). ACR  Breast Density Category b: There are scattered areas of fibroglandular density. FINDINGS: 3D tomographic and 2D generated true lateral and spot compression oblique images of the left breast demonstrate normal fibroglandular tissue and supporting ligaments in the posterior upper left breast at the location of the recently suspected asymmetry. IMPRESSION: No evidence of malignancy. The recently suspected left breast asymmetry was close apposition of normal breast tissue. RECOMMENDATION: Bilateral screening mammogram in 1 year. I have discussed the findings and recommendations with the patient. If applicable, a reminder letter will be sent to the patient regarding the next appointment. BI-RADS CATEGORY  1: Negative. Electronically Signed   By: 09/17/2020 M.D.   On: 07/17/2020 14:40  09/17/2020 PELVIC COMPLETE WITH TRANSVAGINAL  Result Date: 08/10/2020 CLINICAL DATA:  70 year old postmenopausal female with vaginal bleeding for 3-4 days. EXAM: TRANSABDOMINAL AND TRANSVAGINAL ULTRASOUND OF PELVIS TECHNIQUE: Both transabdominal and transvaginal ultrasound examinations of the pelvis were performed. Transabdominal technique was performed for global imaging of the pelvis including uterus, ovaries, adnexal regions, and pelvic cul-de-sac. It was necessary to proceed with endovaginal exam following the transabdominal exam to visualize the endometrium. COMPARISON:  Lumbar MRI 06/18/2016. FINDINGS: Uterus Measurements: 9.1 x 3.5 x 4.8 cm = volume: 81 mL. No fibroids or other mass visualized. Endometrium Thickness: 7 mm, heterogeneous with areas  of endometrial fluid or hypoechoic lesion (images 40, 73). Right ovary Could not be identified despite transabdominal or transvaginal imaging. Left ovary Could not be identified despite transabdominal or transvaginal imaging. Other findings Moderate volume of simple appearing free fluid or ascites in the pelvis, cul-de-sac (images 38 and 78). IMPRESSION: 1. Abnormally thickened and  heterogeneous endometrium, up to 7 mm. In the setting of post-menopausal bleeding, endometrial sampling is indicated to exclude carcinoma. If results are benign, sonohysterogram should be considered for focal lesion work-up. (Ref: Radiological Reasoning: Algorithmic Workup of Abnormal Vaginal Bleeding with Endovaginal Sonography and Sonohysterography. AJR 2008; 993:T70-17). 2. Moderate volume Ascites in the pelvis, nonspecific. Follow-up CT Abdomen and Pelvis (oral and IV contrast preferred) may be valuable. 3. Neither ovary could be identified. Electronically Signed   By: Genevie Ann M.D.   On: 08/10/2020 09:48    Microbiology: Recent Results (from the past 240 hour(s))  Resp Panel by RT-PCR (Flu A&B, Covid) Nasopharyngeal Swab     Status: None   Collection Time: 08/10/20  2:07 AM   Specimen: Nasopharyngeal Swab; Nasopharyngeal(NP) swabs in vial transport medium  Result Value Ref Range Status   SARS Coronavirus 2 by RT PCR NEGATIVE NEGATIVE Final    Comment: (NOTE) SARS-CoV-2 target nucleic acids are NOT DETECTED.  The SARS-CoV-2 RNA is generally detectable in upper respiratory specimens during the acute phase of infection. The lowest concentration of SARS-CoV-2 viral copies this assay can detect is 138 copies/mL. A negative result does not preclude SARS-Cov-2 infection and should not be used as the sole basis for treatment or other patient management decisions. A negative result may occur with  improper specimen collection/handling, submission of specimen other than nasopharyngeal swab, presence of viral mutation(s) within the areas targeted by this assay, and inadequate number of viral copies(<138 copies/mL). A negative result must be combined with clinical observations, patient history, and epidemiological information. The expected result is Negative.  Fact Sheet for Patients:  EntrepreneurPulse.com.au  Fact Sheet for Healthcare Providers:   IncredibleEmployment.be  This test is no t yet approved or cleared by the [place] FDA and  has been authorized for detection and/or diagnosis of SARS-CoV-2 by FDA under an Emergency Use Authorization (EUA). This EUA will remain  in effect (meaning this test can be used) for the duration of the COVID-19 declaration under Section 564(b)(1) of the Act, 21 U.S.C.section 360bbb-3(b)(1), unless the authorization is terminated  or revoked sooner.       Influenza A by PCR NEGATIVE NEGATIVE Final   Influenza B by PCR NEGATIVE NEGATIVE Final    Comment: (NOTE) The Xpert Xpress SARS-CoV-2/FLU/RSV plus assay is intended as an aid in the diagnosis of influenza from Nasopharyngeal swab specimens and should not be used as a sole basis for treatment. Nasal washings and aspirates are unacceptable for Xpert Xpress SARS-CoV-2/FLU/RSV testing.  Fact Sheet for Patients: EntrepreneurPulse.com.au  Fact Sheet for Healthcare Providers: IncredibleEmployment.be  This test is not yet approved or cleared by the [place] FDA and has been authorized for detection and/or diagnosis of SARS-CoV-2 by FDA under an Emergency Use Authorization (EUA). This EUA will remain in effect (meaning this test can be used) for the duration of the COVID-19 declaration under Section 564(b)(1) of the Act, 21 U.S.C. section 360bbb-3(b)(1), unless the authorization is terminated or revoked.  Performed at [facility], [address] [place], [place] [address]      Labs: Basic Metabolic Panel: Recent Labs  Lab 08/10/20 0006 08/11/20 0404 08/12/20 0205  NA  139 138 141  K 4.1 3.6 3.5  CL 111 108 108  CO2 $Re'22 22 24  'lqz$ GLUCOSE 96 124* 143*  BUN $Re'20 18 19  'Cpo$ CREATININE 1.06* 0.76 0.91  CALCIUM 8.6* 8.6* 8.8*   Liver Function Tests: Recent Labs  Lab 08/11/20 0404  AST 33  ALT 22  ALKPHOS 126  BILITOT 1.0  PROT 6.8  ALBUMIN 3.0*   No results for  input(s): LIPASE, AMYLASE in the last 168 hours. No results for input(s): AMMONIA in the last 168 hours. CBC: Recent Labs  Lab 08/10/20 0006 08/11/20 0404 08/12/20 0205  WBC 7.8 8.1 7.7  NEUTROABS 5.5  --   --   HGB 10.7* 10.5* 10.4*  HCT 35.3* 34.7* 33.8*  MCV 98.1 96.7 96.0  PLT 237 229 243   Cardiac Enzymes: No results for input(s): CKTOTAL, CKMB, CKMBINDEX, TROPONINI in the last 168 hours. BNP: Invalid input(s): POCBNP CBG: Recent Labs  Lab 08/11/20 0728 08/11/20 1113 08/11/20 1609 08/11/20 2037 08/12/20 0724  GLUCAP 115* 135* 123* 182* 124*    Time coordinating discharge:  36 minutes  Signed:  Orson Eva, DO [facility] Pager: [phone] 08/12/2020, 9:51 AM

## 2020-08-12 NOTE — Progress Notes (Signed)
ANTICOAGULATION CONSULT NOTE -   Pharmacy Consult for warfarin Indication: hx of PE  Allergies  Allergen Reactions   Lisinopril Shortness Of Breath, Swelling, Cough and Other (See Comments)   Xarelto [Rivaroxaban] Rash    Patient Measurements:   Heparin Dosing Weight:   Vital Signs: Temp: 97.8 F (36.6 C) (07/31 0517) Temp Source: Oral (07/31 0517) BP: 106/65 (07/31 0517) Pulse Rate: 91 (07/31 0517)  Labs: Recent Labs    08/10/20 0006 08/11/20 0404 08/12/20 0205  HGB 10.7* 10.5* 10.4*  HCT 35.3* 34.7* 33.8*  PLT 237 229 243  APTT  --  43*  --   LABPROT 41.6* 36.0* 29.3*  INR 4.3* 3.6* 2.8*  CREATININE 1.06* 0.76 0.91     Estimated Creatinine Clearance: 63.7 mL/min (by C-G formula based on SCr of 0.91 mg/dL).   Medical History: Past Medical History:  Diagnosis Date   Arthritis    back/saw Dr. Aline Brochure   Breast cancer [facility])    Cancer [facility])    breast   Diabetes mellitus    Emphysema lung ([place])    Hypertension    Low back pain    Obesity    Pulmonary embolism [facility])    January 2017, August 2020   Sleep apnea 04/2013    Medications:  Medications Prior to Admission  Medication Sig Dispense Refill Last Dose   acetaminophen (TYLENOL) 650 MG CR tablet Take 1,300 mg by mouth 2 (two) times daily.   08/09/2020   dapagliflozin propanediol (FARXIGA) 10 MG TABS tablet Take 10 mg by mouth daily.   08/09/2020   ergocalciferol (VITAMIN D2) 1.25 MG (50000 UT) capsule Take 1 capsule (50,000 Units total) by mouth once a week. Take every other week 16 capsule 3 08/09/2020   furosemide (LASIX) 20 MG tablet Take 10 mg by mouth daily.   08/09/2020   glipiZIDE (GLUCOTROL) 10 MG tablet Take 10 mg by mouth 2 (two) times daily before a meal.    08/09/2020   latanoprost (XALATAN) 0.005 % ophthalmic solution Place 1 drop into both eyes at bedtime.   08/08/2020   letrozole (FEMARA) 2.5 MG tablet Take 1 tablet (2.5 mg total) by mouth daily. 90 tablet 3 08/09/2020   losartan (COZAAR) 25 MG  tablet Take 25 mg by mouth daily.   08/09/2020   metFORMIN (GLUCOPHAGE) 1000 MG tablet Take 1,000 mg by mouth 2 (two) times daily.  1 08/09/2020   metoprolol succinate (TOPROL-XL) 25 MG 24 hr tablet Take 100 mg by mouth every morning.  1 08/09/2020   warfarin (COUMADIN) 1 MG tablet Take 1 mg by mouth daily. Coumadin is being adjusted now due to elevated INR.      warfarin (COUMADIN) 5 MG tablet Take 5 mg by mouth daily.    08/09/2020   Accu-Chek FastClix Lancets MISC       ACCU-CHEK GUIDE test strip       Alcohol Swabs (B-D SINGLE USE SWABS REGULAR) PADS       EPINEPHrine 0.3 mg/0.3 mL IJ SOAJ injection Inject 0.3 mg into the muscle as needed.    PRN   Lancets Misc. (ACCU-CHEK FASTCLIX LANCET) KIT        Assessment: Pharmacy consulted to dose warfarin in patient with history of PE from January 2017 and August 2020.  Patient with supratherapeutic INR of 4.3 on admission.  Per patient, she alternates doses of 4 mg and 5 mg.  Hgb 10.4 INR 4.3 > 3.6 > 2.8  Goal of Therapy:  INR 2-3  Monitor platelets by anticoagulation protocol: Yes   Plan:  Warfarin 2 mg x 1 dose. Patient to be discharged and to follow-up with Coumadin clinic  Monitor daily INR and s/s of bleeding  Ramond Craver 08/12/2020,8:20 AM

## 2020-08-20 ENCOUNTER — Encounter: Payer: Self-pay | Admitting: Obstetrics & Gynecology

## 2020-08-20 ENCOUNTER — Other Ambulatory Visit: Payer: Self-pay

## 2020-08-20 ENCOUNTER — Ambulatory Visit: Payer: Medicare PPO | Admitting: Obstetrics & Gynecology

## 2020-08-20 ENCOUNTER — Other Ambulatory Visit (HOSPITAL_COMMUNITY)
Admission: RE | Admit: 2020-08-20 | Discharge: 2020-08-20 | Disposition: A | Discharge status: Home / Self Care | Payer: Medicare PPO | Source: Ambulatory Visit | Attending: Obstetrics & Gynecology | Admitting: Obstetrics & Gynecology

## 2020-08-20 VITALS — BP 126/84 | HR 103 | Wt 221.0 lb

## 2020-08-20 DIAGNOSIS — R9389 Abnormal findings on diagnostic imaging of other specified body structures: Secondary | ICD-10-CM | POA: Insufficient documentation

## 2020-08-20 DIAGNOSIS — N950 Postmenopausal bleeding: Secondary | ICD-10-CM | POA: Diagnosis present

## 2020-08-20 NOTE — Progress Notes (Signed)
Endometrial Biopsy Procedure Note  Pre-operative Diagnosis: Post menopausal bleeding with thickened endometrium, 7 mm, on sonogram, on chronic anti coagulation therapy for PE  Post-operative Diagnosis: same  Indications: postmenopausal bleeding  Procedure Details   Urine pregnancy test was not done.  The risks (including infection, bleeding, pain, and uterine perforation) and benefits of the procedure were explained to the patient and Written informed consent was obtained.  Antibiotic prophylaxis against endocarditis was not indicated.   The patient was placed in the dorsal lithotomy position.  Bimanual exam showed the uterus to be in the neutral position.  A Graves' speculum inserted in the vagina, and the cervix prepped with povidone iodine.  Endocervical curettage with a Kevorkian curette was not performed.   A sharp tenaculum was applied to the anterior lip of the cervix for stabilization.  A sterile uterine sound was used to sound the uterus to a depth of 6.5 cm.  A Pipelle endometrial aspirator was used to sample the endometrium.  Sample was sent for pathologic examination.  Condition: Stable  Complications: None  Plan:  The patient was advised to call for any fever or for prolonged or severe pain or bleeding. She was advised to use OTC analgesics as needed for mild to moderate pain. She was advised to avoid vaginal intercourse for 48 hours or until the bleeding has completely stopped.  Attending Physician Documentation: I was present for or performed the following: endometrial biopsy   We will do a telephone visit for follow up results

## 2020-08-21 LAB — SURGICAL PATHOLOGY

## 2020-09-03 ENCOUNTER — Encounter: Payer: Self-pay | Admitting: Obstetrics & Gynecology

## 2020-09-03 ENCOUNTER — Telehealth (INDEPENDENT_AMBULATORY_CARE_PROVIDER_SITE_OTHER): Payer: Medicare PPO | Admitting: Obstetrics & Gynecology

## 2020-09-03 DIAGNOSIS — R9389 Abnormal findings on diagnostic imaging of other specified body structures: Secondary | ICD-10-CM | POA: Diagnosis not present

## 2020-09-03 DIAGNOSIS — N950 Postmenopausal bleeding: Secondary | ICD-10-CM | POA: Diagnosis not present

## 2020-09-03 NOTE — Progress Notes (Signed)
Telephone visit Patient is at home I am in my office 2 identifiers used Otal time 10 minutes    Follow up appointment for results  Chief Complaint  Patient presents with   Follow-up    Discuss biopsy results    There were no vitals taken for this visit.  EMBx: Inactive endometrium no atypia hyperplasia or malignancy is noted    MEDS ordered this encounter: No orders of the defined types were placed in this encounter.   Orders for this encounter: No orders of the defined types were placed in this encounter.   Impression: PMB on blood thinners with negative endometrial biopsy pathology  Plan: No further follow up or therapy is needed  Follow Up: Return if symptoms worsen or fail to improve.       Face to face time:  10 minutes  Greater than 50% of the visit time was spent in counseling and coordination of care with the patient.  The summary and outline of the counseling and care coordination is summarized in the note above.   All questions were answered.  Past Medical History:  Diagnosis Date   Arthritis    back/saw Dr. Aline Brochure   Breast cancer [facility])    Cancer [facility])    breast   Diabetes mellitus    Emphysema lung ([place])    Hypertension    Low back pain    Obesity    Pulmonary embolism [facility])    January 2017, August 2020   Sleep apnea 04/2013    Past Surgical History:  Procedure Laterality Date   BIOPSY BREAST     TUBAL LIGATION      OB History     Gravida  3   Para  2   Term  2   Preterm      AB  1   Living  2      SAB  1   IAB      Ectopic      Multiple      Live Births              Allergies  Allergen Reactions   Lisinopril Shortness Of Breath, Swelling, Cough and Other (See Comments)   Xarelto [Rivaroxaban] Rash    Social History   Socioeconomic History   Marital status: Single    Spouse name: Not on file   Number of children: Not on file   Years of education: Not on file   Highest education level: Not  on file  Occupational History   Not on file  Tobacco Use   Smoking status: Former    Packs/day: 1.50    Years: 17.00    Pack years: 25.50    Types: Cigarettes    Quit date: 12/18/1994    Years since quitting: 25.7   Smokeless tobacco: Never  Vaping Use   Vaping Use: Never used  Substance and Sexual Activity   Alcohol use: No   Drug use: No   Sexual activity: Not Currently    Birth control/protection: Surgical    Comment: tubal  Other Topics Concern   Not on file  Social History Narrative   Not on file   Social Determinants of Health   Financial Resource Strain: Low Risk    Difficulty of Paying Living Expenses: Not hard at all  Food Insecurity: No Food Insecurity   Worried About Charity fundraiser in the Last Year: Never true   [place] in the Last Year: Never  true  Transportation Needs: No Transportation Needs   Lack of Transportation (Medical): No   Lack of Transportation (Non-Medical): No  Physical Activity: Inactive   Days of Exercise per Week: 0 days   Minutes of Exercise per Session: 0 min  Stress: No Stress Concern Present   Feeling of Stress : Not at all  Social Connections: Moderately Isolated   Frequency of Communication with Friends and Family: More than three times a week   Frequency of Social Gatherings with Friends and Family: More than three times a week   Attends Religious Services: More than 4 times per year   Active Member of [employer] or Organizations: No   Attends Archivist Meetings: Never   Marital Status: Never married    Family History  Problem Relation Age of Onset   Liver cancer Mother    Cancer Mother    Diabetes Mother    Breast cancer Sister    Cancer Sister

## 2020-09-21 ENCOUNTER — Ambulatory Visit: Payer: Medicare PPO | Admitting: Pulmonary Disease

## 2020-09-21 ENCOUNTER — Encounter: Payer: Self-pay | Admitting: Pulmonary Disease

## 2020-09-21 ENCOUNTER — Other Ambulatory Visit: Payer: Self-pay

## 2020-09-21 VITALS — BP 132/74 | HR 77 | Temp 98.3°F | Ht 62.0 in | Wt 227.0 lb

## 2020-09-21 DIAGNOSIS — J9611 Chronic respiratory failure with hypoxia: Secondary | ICD-10-CM | POA: Diagnosis not present

## 2020-09-21 DIAGNOSIS — J432 Centrilobular emphysema: Secondary | ICD-10-CM | POA: Diagnosis not present

## 2020-09-21 DIAGNOSIS — Z23 Encounter for immunization: Secondary | ICD-10-CM | POA: Diagnosis not present

## 2020-09-21 DIAGNOSIS — I2699 Other pulmonary embolism without acute cor pulmonale: Secondary | ICD-10-CM

## 2020-09-21 DIAGNOSIS — G4733 Obstructive sleep apnea (adult) (pediatric): Secondary | ICD-10-CM

## 2020-09-21 NOTE — Patient Instructions (Signed)
  Ambulatory sat Home sleep test FLu shot today

## 2020-09-21 NOTE — Progress Notes (Signed)
Subjective:    Patient ID: Alexa Castillo, female    DOB: 17-Apr-1950, 70 y.o.   MRN: [id number]  HPI  Chief Complaint  Patient presents with   Consult    PCP referred pt because of some SOB. 2L-3L O2 at home when moving around after covid Oct 2732.    70 year old remote smoker referred for evaluation of chronic hypoxic respiratory failure and OSA. She smoked about 25 pack years before she quit 1996 She had an ICU admission 08/2018 for acute hypoxic respiratory failure in the setting of COVID infection and submassive extensive bilateral PE with RV strain.  Echocardiogram showed RA/RV thrombus, she was treated with tPA and eventually discharged on 2 L oxygen and Xarelto. She has a history of prior PE in January 2017 and was maintained on anticoagulation with Xarelto until 2019.  OSA was diagnosed more than 6 years ago by sleep studies done in [place] and she was placed on CPAP with full facemask, DME was [facility] in [place]   PMH -breast cancer on Femara, hypertension, diabetes  She stopped using CPAP when she obtain oxygen and was using nocturnal O2. She now uses oxygen on an as-needed basis and during sleep.  She has been sleeping in a recliner for the past few months due to chronic back pain. She ambulates with a walker. She has chronic bipedal edema and leg ulcers and has undergone venous stripping on the right leg  Report sleepiness score is 10.  Bedtime could be around midnight but more often is whenever her pain will allow, she sleeps in a recliner, sleeps about 4 hours and is out of bed by 6 AM feeling tired with dryness of mouth but denies headaches. There is no history suggestive of cataplexy, sleep paralysis or parasomnias  She did not tolerate Xarelto and is now on Coumadin     Significant tests/ events reviewed CTA chest 08/2018>> extensive bilateral pulmonary emboli, RV/LV 1.3, moderate emphysema, mild mediastinal lymphadenopathy  Echo 08/2018 large thrombus  9.5 cm in the RA extending across to the RV, moderate to severe TR   Past Medical History:  Diagnosis Date   Arthritis    back/saw Dr. Aline Brochure   Breast cancer [facility])    Cancer [facility])    breast   Diabetes mellitus    Emphysema lung ([place])    Hypertension    Low back pain    Obesity    Pulmonary embolism [facility])    January 2017, August 2020   Sleep apnea 04/2013   Past Surgical History:  Procedure Laterality Date   BIOPSY BREAST     TUBAL LIGATION      Allergies  Allergen Reactions   Lisinopril Shortness Of Breath, Swelling, Cough and Other (See Comments)   Xarelto [Rivaroxaban] Rash    Social History   Socioeconomic History   Marital status: Single    Spouse name: Not on file   Number of children: Not on file   Years of education: Not on file   Highest education level: Not on file  Occupational History   Not on file  Tobacco Use   Smoking status: Former    Packs/day: 1.50    Years: 17.00    Pack years: 25.50    Types: Cigarettes    Quit date: 12/18/1994    Years since quitting: 25.7   Smokeless tobacco: Never  Vaping Use   Vaping Use: Never used  Substance and Sexual Activity   Alcohol use: No   Drug use:  No   Sexual activity: Not Currently    Birth control/protection: Surgical    Comment: tubal  Other Topics Concern   Not on file  Social History Narrative   Not on file   Social Determinants of Health   Financial Resource Strain: Low Risk    Difficulty of Paying Living Expenses: Not hard at all  Food Insecurity: No Food Insecurity   Worried About Charity fundraiser in the Last Year: Never true   [place] in the Last Year: Never true  Transportation Needs: No Transportation Needs   Lack of Transportation (Medical): No   Lack of Transportation (Non-Medical): No  Physical Activity: Inactive   Days of Exercise per Week: 0 days   Minutes of Exercise per Session: 0 min  Stress: No Stress Concern Present   Feeling of Stress : Not at all  Social  Connections: Moderately Isolated   Frequency of Communication with Friends and Family: More than three times a week   Frequency of Social Gatherings with Friends and Family: More than three times a week   Attends Religious Services: More than 4 times per year   Active Member of [employer] or Organizations: No   Attends Music therapist: Never   Marital Status: Never married  Human resources officer Violence: Not At Risk   Fear of Current or Ex-Partner: No   Emotionally Abused: No   Physically Abused: No   Sexually Abused: No     Family History  Problem Relation Age of Onset   Liver cancer Mother    Cancer Mother    Diabetes Mother    Breast cancer Sister    Cancer Sister      Review of Systems Shortness of breath with activity Feet swelling Leg ulcers Joint stiffness   Constitutional: negative for anorexia, fevers and sweats  Eyes: negative for irritation, redness and visual disturbance  Ears, nose, mouth, throat, and face: negative for earaches, epistaxis, nasal congestion and sore throat  Respiratory: negative for cough, sputum and wheezing  Cardiovascular: negative for chest pain, orthopnea, palpitations and syncope  Gastrointestinal: negative for abdominal pain, constipation, diarrhea, melena, nausea and vomiting  Genitourinary:negative for dysuria, frequency and hematuria  Hematologic/lymphatic: negative for bleeding, easy bruising and lymphadenopathy  Musculoskeletal:negative for arthralgias, muscle weakness and stiff joints  Neurological: negative for coordination problems, gait problems, headaches and weakness  Endocrine: negative for diabetic symptoms including polydipsia, polyuria and weight loss     Objective:   Physical Exam  Gen. Pleasant, obese, in no distress, normal affect ENT - no pallor,icterus, no post nasal drip, class 2-airway Neck: No JVD, no thyromegaly, no carotid bruits Lungs: no use of accessory muscles, no dullness to percussion,  decreased without rales or rhonchi  Cardiovascular: Rhythm regular, heart sounds  normal, no murmurs or gallops, 2+ peripheral edema Abdomen: soft and non-tender, no hepatosplenomegaly, BS normal. Musculoskeletal: No deformities, no cyanosis or clubbing, legs wrapped Neuro:  alert, non focal, no tremors       Assessment & Plan:

## 2020-09-21 NOTE — Assessment & Plan Note (Signed)
Noted on CT. We will schedule PFTs and decide need for bronchodilators

## 2020-09-21 NOTE — Assessment & Plan Note (Signed)
We will reassess degree of OSA with home sleep test.  Confounding factor is that she is sleeping in a recliner now and this may underestimate degree of OSA. This will also allow [place] to advise her about need for nocturnal oxygen.  Meantime I have asked her to resume her CPAP with full facemask and we will provide her with supplies as needed

## 2020-09-21 NOTE — Assessment & Plan Note (Addendum)
She had prior PE in 2017 and again after COVID infection in 2020 as such she will need anticoagulation lifelong especially given her limited mobility now with her leg ulcers.  She developed a rash with Xarelto although she had tolerated this in the past and is now on Coumadin. We can try Eliquis in the future if she really wants to switch from Coumadin Repeat echo to assess whether RA/RV clot has resolved

## 2020-09-21 NOTE — Assessment & Plan Note (Addendum)
Oxygen saturation is 97% today.  She desaturated to 88% on walking less than 200 feet and heart rate increased to 138 indicating significant deconditioning  We will advise her to hold onto her oxygen for now

## 2020-09-25 ENCOUNTER — Other Ambulatory Visit (HOSPITAL_COMMUNITY): Payer: Medicare PPO

## 2020-10-11 ENCOUNTER — Ambulatory Visit (HOSPITAL_COMMUNITY): Admission: RE | Admit: 2020-10-11 | Payer: Medicare PPO | Source: Ambulatory Visit

## 2020-10-15 ENCOUNTER — Ambulatory Visit: Payer: Medicare PPO | Admitting: Obstetrics & Gynecology

## 2020-10-24 ENCOUNTER — Ambulatory Visit (HOSPITAL_COMMUNITY)
Admission: RE | Admit: 2020-10-24 | Discharge: 2020-10-24 | Disposition: A | Discharge status: Home / Self Care | Payer: Medicare PPO | Source: Ambulatory Visit | Attending: Pulmonary Disease | Admitting: Pulmonary Disease

## 2020-10-24 ENCOUNTER — Other Ambulatory Visit: Payer: Self-pay

## 2020-10-24 DIAGNOSIS — E119 Type 2 diabetes mellitus without complications: Secondary | ICD-10-CM | POA: Insufficient documentation

## 2020-10-24 DIAGNOSIS — I351 Nonrheumatic aortic (valve) insufficiency: Secondary | ICD-10-CM

## 2020-10-24 DIAGNOSIS — G4730 Sleep apnea, unspecified: Secondary | ICD-10-CM | POA: Insufficient documentation

## 2020-10-24 DIAGNOSIS — J449 Chronic obstructive pulmonary disease, unspecified: Secondary | ICD-10-CM | POA: Insufficient documentation

## 2020-10-24 DIAGNOSIS — Z853 Personal history of malignant neoplasm of breast: Secondary | ICD-10-CM | POA: Insufficient documentation

## 2020-10-24 DIAGNOSIS — I082 Rheumatic disorders of both aortic and tricuspid valves: Secondary | ICD-10-CM | POA: Insufficient documentation

## 2020-10-24 DIAGNOSIS — I509 Heart failure, unspecified: Secondary | ICD-10-CM | POA: Diagnosis not present

## 2020-10-24 DIAGNOSIS — I2699 Other pulmonary embolism without acute cor pulmonale: Secondary | ICD-10-CM | POA: Insufficient documentation

## 2020-10-24 DIAGNOSIS — E785 Hyperlipidemia, unspecified: Secondary | ICD-10-CM | POA: Insufficient documentation

## 2020-10-24 DIAGNOSIS — I361 Nonrheumatic tricuspid (valve) insufficiency: Secondary | ICD-10-CM | POA: Diagnosis not present

## 2020-10-24 DIAGNOSIS — I110 Hypertensive heart disease with heart failure: Secondary | ICD-10-CM | POA: Insufficient documentation

## 2020-10-24 LAB — ECHOCARDIOGRAM COMPLETE
Area-P 1/2: 4.21 cm2
S' Lateral: 2.4 cm

## 2020-10-24 NOTE — Progress Notes (Signed)
  Echocardiogram 2D Echocardiogram has been performed.  Alexa Castillo 10/24/2020, 1:42 PM

## 2020-10-26 ENCOUNTER — Other Ambulatory Visit: Payer: Self-pay | Admitting: Pulmonary Disease

## 2020-10-26 DIAGNOSIS — I2699 Other pulmonary embolism without acute cor pulmonale: Secondary | ICD-10-CM

## 2020-10-26 NOTE — Progress Notes (Signed)
Spoke with pt and notified of results per Dr.Alva, Pt verbalized understanding and denied any questions.She was agreeable to CT and I ordered this as well as BMET and she will go to [place] office to have this done

## 2020-11-06 ENCOUNTER — Telehealth: Payer: Medicare PPO | Admitting: Family

## 2020-11-06 DIAGNOSIS — J432 Centrilobular emphysema: Secondary | ICD-10-CM

## 2020-11-06 DIAGNOSIS — R051 Acute cough: Secondary | ICD-10-CM | POA: Diagnosis not present

## 2020-11-06 MED ORDER — BENZONATATE 100 MG PO CAPS: 100.0000 mg | ORAL_CAPSULE | Freq: Three times a day (TID) | ORAL | 0 refills | Status: AC | PRN

## 2020-11-06 MED ORDER — PREDNISONE 10 MG (21) PO TBPK: ORAL_TABLET | ORAL | 0 refills | Status: DC

## 2020-11-06 NOTE — Progress Notes (Signed)

## 2020-11-23 ENCOUNTER — Telehealth: Payer: Medicare PPO | Admitting: Emergency Medicine

## 2020-11-23 DIAGNOSIS — J432 Centrilobular emphysema: Secondary | ICD-10-CM

## 2020-11-23 DIAGNOSIS — R059 Cough, unspecified: Secondary | ICD-10-CM | POA: Diagnosis not present

## 2020-11-23 DIAGNOSIS — R051 Acute cough: Secondary | ICD-10-CM | POA: Diagnosis not present

## 2020-11-23 MED ORDER — PREDNISONE 10 MG (21) PO TBPK: ORAL_TABLET | ORAL | 0 refills | Status: AC

## 2020-11-23 NOTE — Progress Notes (Signed)
Hello Alexa Castillo, I do not mind refilling the prednisone for you since your original one was lost. I do worry about your cough. Have you spoken with Dr. Elsworth Soho your pulmonologist about it? I think it would be a good idea to check in with him if your cough is not something you usually have.   We are sorry that you are not feeling well.  Here is how we plan to help!  Based on your presentation I believe you most likely have A cough due to a virus.  This is called viral bronchitis and is best treated by rest, plenty of fluids and control of the cough.  You may use Ibuprofen or Tylenol as directed to help your symptoms.     In addition you may use  Prednisone 10 mg daily for 6 days (see taper instructions below)  From your responses in the eVisit questionnaire you describe inflammation in the upper respiratory tract which is causing a significant cough.  This is commonly called Bronchitis and has four common causes:   Allergies Viral Infections Acid Reflux Bacterial Infection Allergies, viruses and acid reflux are treated by controlling symptoms or eliminating the cause. An example might be a cough caused by taking certain blood pressure medications. You stop the cough by changing the medication. Another example might be a cough caused by acid reflux. Controlling the reflux helps control the cough.  USE OF BRONCHODILATOR ("RESCUE") INHALERS: There is a risk from using your bronchodilator too frequently.  The risk is that over-reliance on a medication which only relaxes the muscles surrounding the breathing tubes can reduce the effectiveness of medications prescribed to reduce swelling and congestion of the tubes themselves.  Although you feel brief relief from the bronchodilator inhaler, your asthma may actually be worsening with the tubes becoming more swollen and filled with mucus.  This can delay other crucial treatments, such as oral steroid medications. If you need to use a bronchodilator inhaler  daily, several times per day, you should discuss this with your provider.  There are probably better treatments that could be used to keep your asthma under control.     HOME CARE Only take medications as instructed by your medical team. Complete the entire course of an antibiotic. Drink plenty of fluids and get plenty of rest. Avoid close contacts especially the very young and the elderly Cover your mouth if you cough or cough into your sleeve. Always remember to wash your hands A steam or ultrasonic humidifier can help congestion.   GET HELP RIGHT AWAY IF: You develop worsening fever. You become short of breath You cough up blood. Your symptoms persist after you have completed your treatment plan MAKE SURE YOU  Understand these instructions. Will watch your condition. Will get help right away if you are not doing well or get worse.    Thank you for choosing an e-visit.  Your e-visit answers were reviewed by a board certified advanced clinical practitioner to complete your personal care plan. Depending upon the condition, your plan could have included both over the counter or prescription medications.  Please review your pharmacy choice. Make sure the pharmacy is open so you can pick up prescription now. If there is a problem, you may contact your provider through [employer] and have the prescription routed to another pharmacy.  Your safety is important to [place]. If you have drug allergies check your prescription carefully.   For the next 24 hours you can use MyChart to ask questions  about today's visit, request a non-urgent call back, or ask for a work or school excuse. You will get an email in the next two days asking about your experience. I hope that your e-visit has been valuable and will speed your recovery.  I have spent 5 minutes in review of e-visit questionnaire, review and updating patient chart, medical decision making and response to patient.   Willeen Cass, PhD,  FNP-BC

## 2020-11-27 ENCOUNTER — Ambulatory Visit (HOSPITAL_COMMUNITY): Admission: RE | Admit: 2020-11-27 | Payer: Medicare PPO | Source: Ambulatory Visit

## 2021-01-13 DEATH — deceased

## 2021-07-31 ENCOUNTER — Ambulatory Visit (HOSPITAL_COMMUNITY): Payer: Medicare PPO

## 2021-07-31 ENCOUNTER — Other Ambulatory Visit (HOSPITAL_COMMUNITY): Payer: Medicare PPO

## 2021-08-07 ENCOUNTER — Ambulatory Visit (HOSPITAL_COMMUNITY): Payer: Medicare PPO | Admitting: Physician Assistant

## 2022-04-09 IMAGING — MG MM DIGITAL DIAGNOSTIC UNILAT*L* W/ TOMO W/ CAD
4 series · 4 of 12 positions shown · non-contrast
Comparison: Previous exam(s).

CLINICAL DATA: Possible asymmetry in the posterior upper left
breast in the oblique projection of a recent screening mammogram.

EXAM:
DIGITAL DIAGNOSTIC UNILATERAL LEFT MAMMOGRAM WITH TOMOSYNTHESIS AND
CAD
TECHNIQUE: Left digital diagnostic mammography and breast tomosynthesis was
performed. The images were evaluated with computer-aided detection.

[L MLO synth-2D]
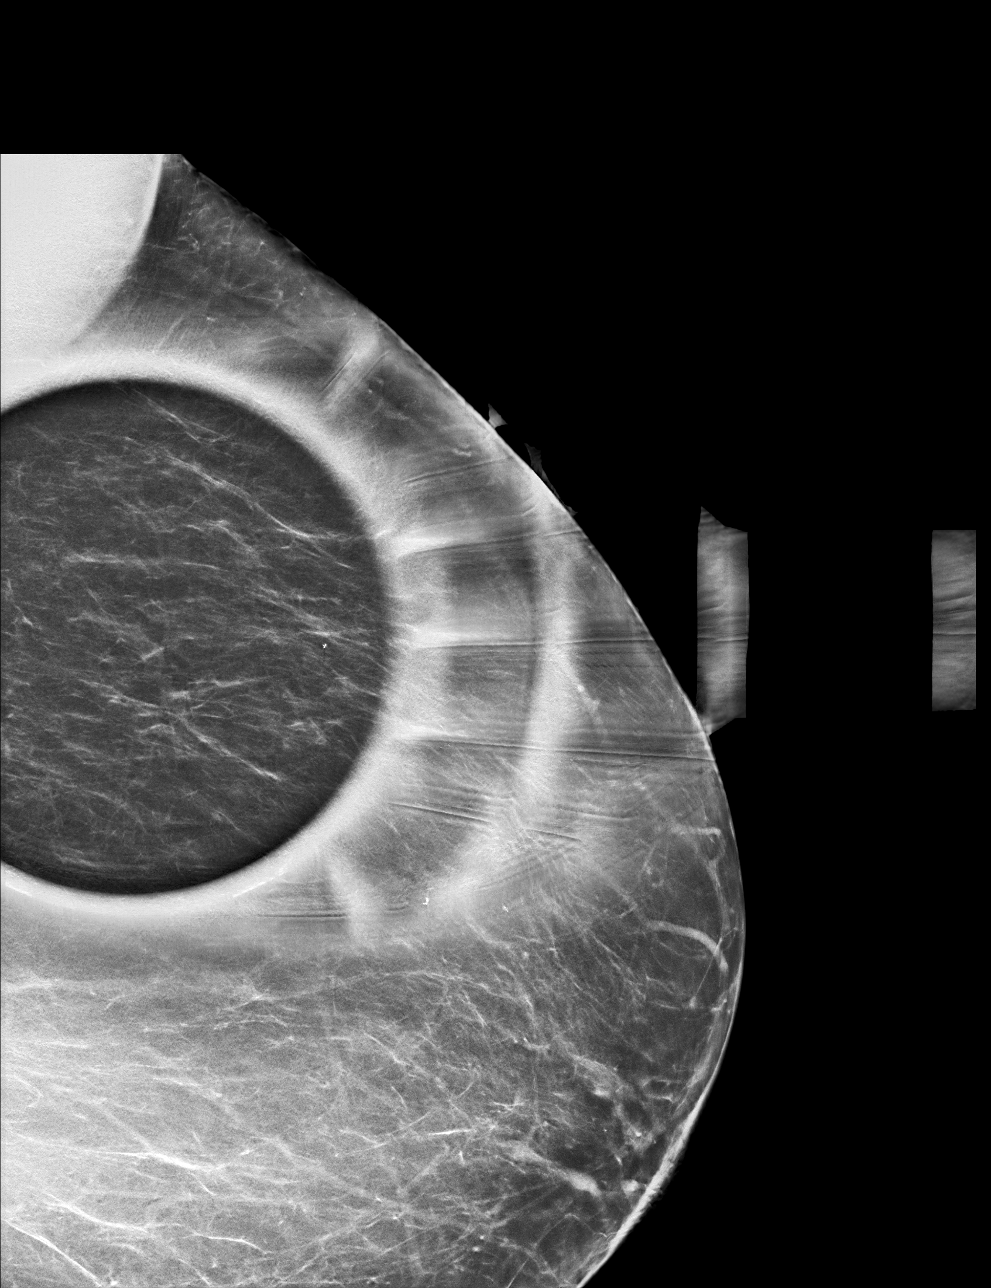

[L ML synth-2D]
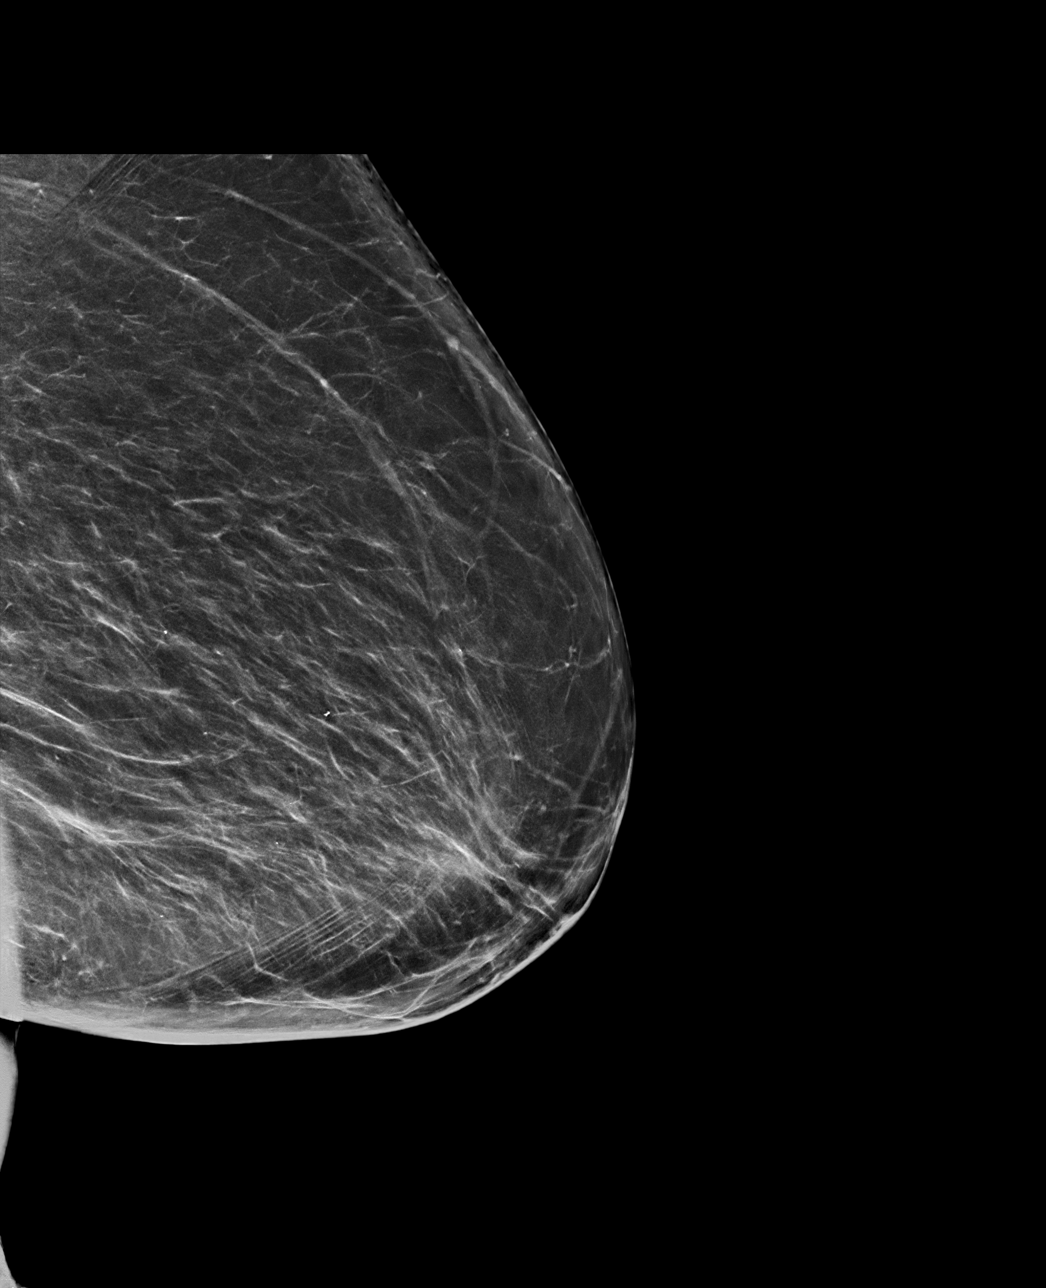

[L ML tomo · tomo slice 42/83.0]
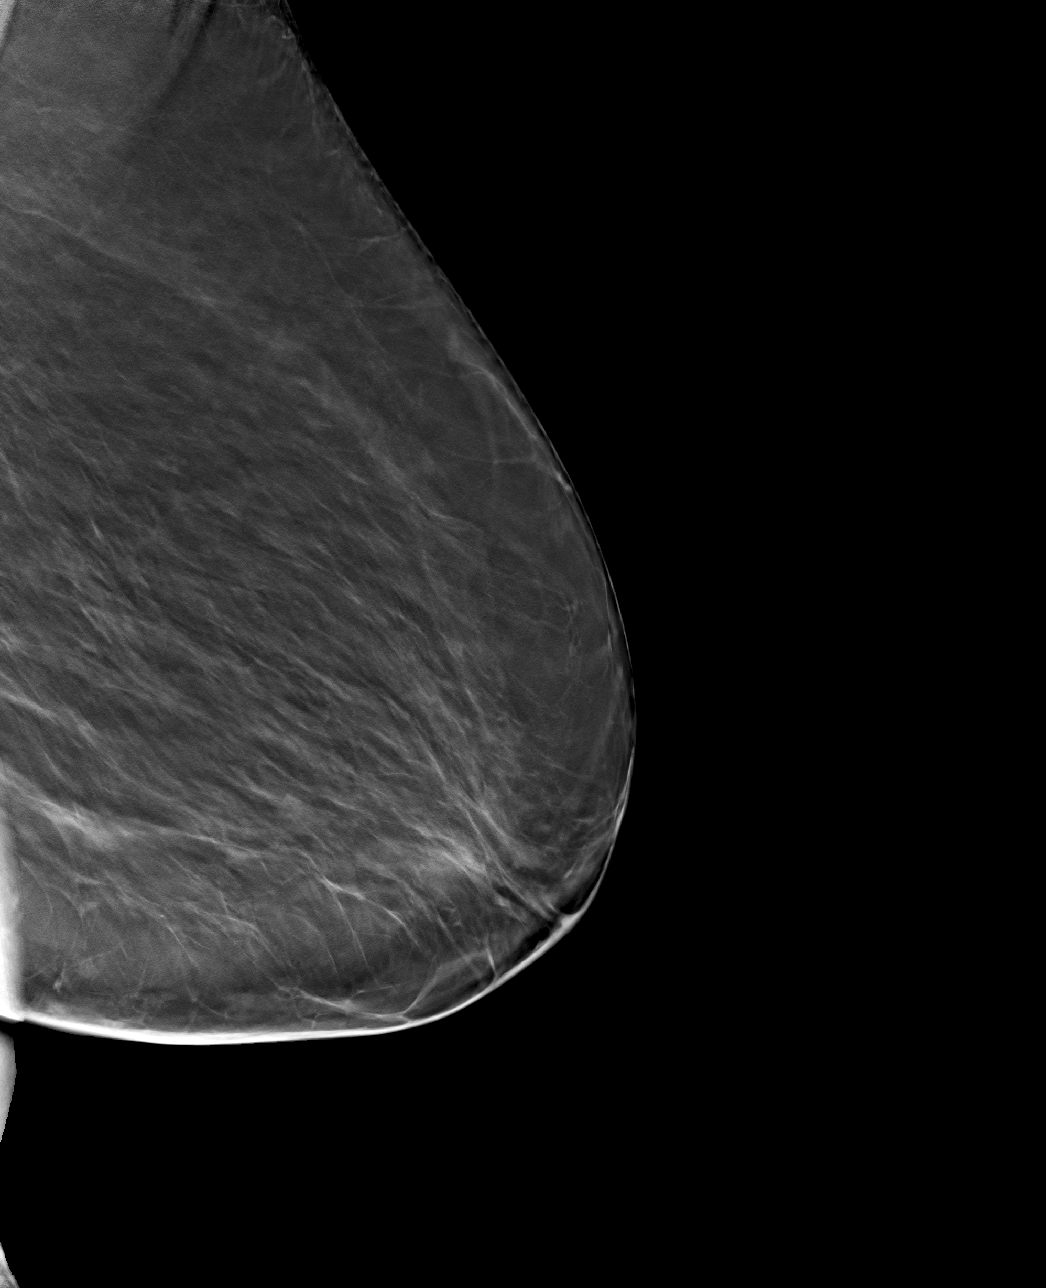

[L MLO tomo · tomo slice 35/68.0]
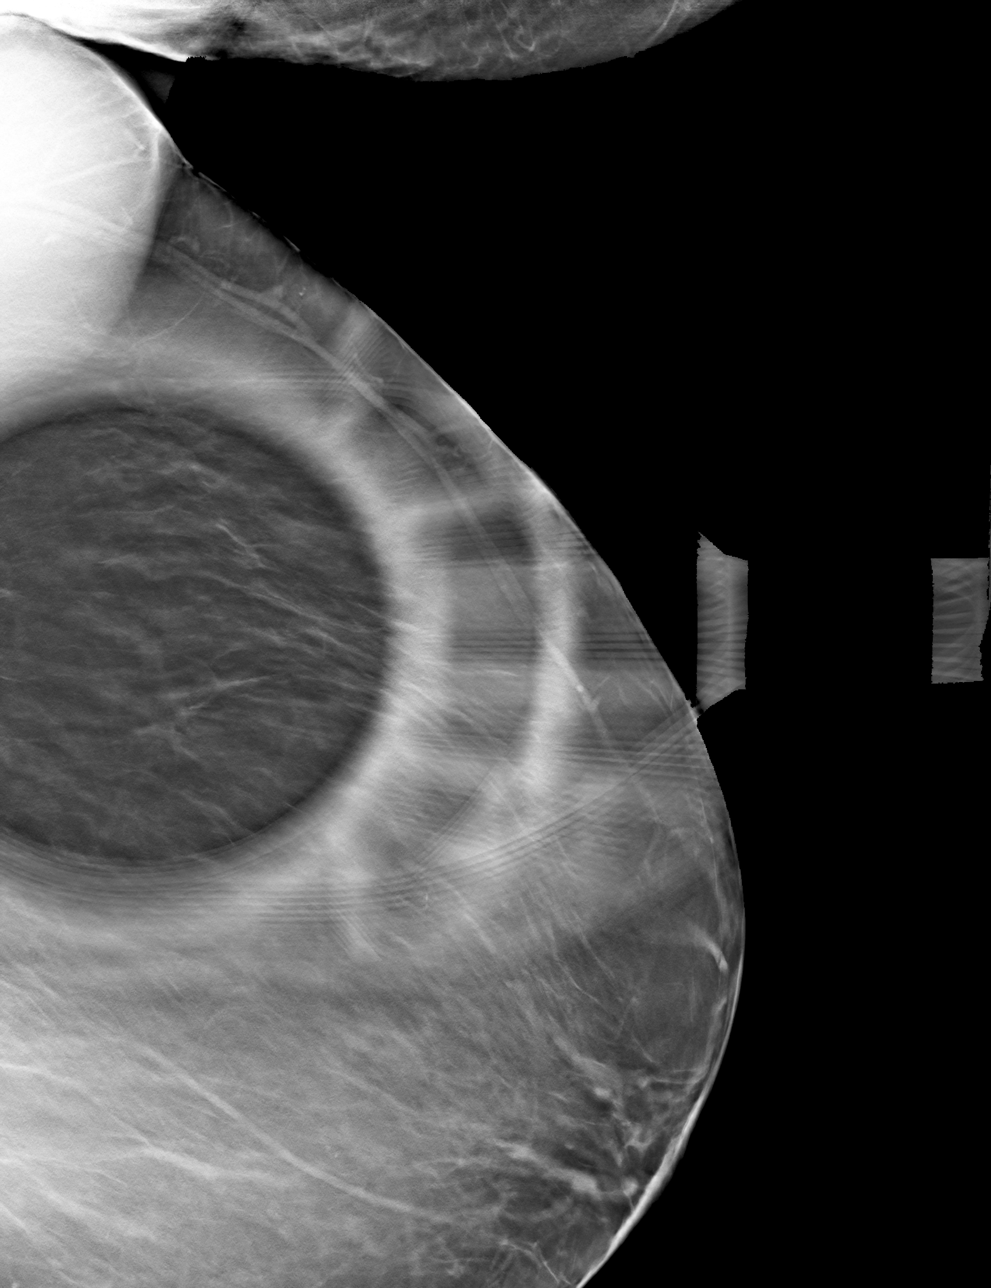

[4 of 12 positions shown; findings below may reference images not displayed]

ACR Breast Density Category b: There are scattered areas of
fibroglandular density.
FINDINGS: 3D tomographic and 2D generated true lateral and spot compression
oblique images of the left breast demonstrate normal fibroglandular
tissue and supporting ligaments in the posterior upper left breast
at the location of the recently suspected asymmetry.
IMPRESSION: No evidence of malignancy. The recently suspected left breast
asymmetry was close apposition of normal breast tissue.

RECOMMENDATION:
Bilateral screening mammogram in 1 year.

I have discussed the findings and recommendations with the patient.
If applicable, a reminder letter will be sent to the patient
regarding the next appointment.

BI-RADS CATEGORY  1: Negative.
